# Patient Record
Sex: Male | Born: 1955 | Race: White | Hispanic: No | Smoking: Current every day smoker
Health system: Southern US, Community
[De-identification: ages and names within clinical notes are randomized; demographics above are authoritative.]

## PROBLEM LIST (undated history)

## (undated) DIAGNOSIS — J449 Chronic obstructive pulmonary disease, unspecified: Secondary | ICD-10-CM

## (undated) DIAGNOSIS — Z720 Tobacco use: Secondary | ICD-10-CM

---

## 2005-01-27 ENCOUNTER — Inpatient Hospital Stay: Payer: Self-pay | Admitting: Internal Medicine

## 2005-01-27 ENCOUNTER — Other Ambulatory Visit: Payer: Self-pay

## 2005-03-22 ENCOUNTER — Emergency Department: Payer: Self-pay | Admitting: Emergency Medicine

## 2016-03-25 ENCOUNTER — Encounter: Payer: Self-pay | Admitting: Emergency Medicine

## 2016-03-25 ENCOUNTER — Other Ambulatory Visit: Payer: Self-pay

## 2016-03-25 ENCOUNTER — Emergency Department: Payer: Self-pay

## 2016-03-25 ENCOUNTER — Observation Stay
Admission: EM | Admit: 2016-03-25 | Discharge: 2016-03-26 | Disposition: A | Discharge status: Home / Self Care | Payer: Self-pay | Attending: Internal Medicine | Admitting: Internal Medicine

## 2016-03-25 DIAGNOSIS — R05 Cough: Secondary | ICD-10-CM | POA: Insufficient documentation

## 2016-03-25 DIAGNOSIS — E871 Hypo-osmolality and hyponatremia: Secondary | ICD-10-CM | POA: Insufficient documentation

## 2016-03-25 DIAGNOSIS — R000 Tachycardia, unspecified: Secondary | ICD-10-CM | POA: Insufficient documentation

## 2016-03-25 DIAGNOSIS — R531 Weakness: Secondary | ICD-10-CM | POA: Insufficient documentation

## 2016-03-25 DIAGNOSIS — F17210 Nicotine dependence, cigarettes, uncomplicated: Secondary | ICD-10-CM | POA: Insufficient documentation

## 2016-03-25 DIAGNOSIS — R079 Chest pain, unspecified: Secondary | ICD-10-CM

## 2016-03-25 DIAGNOSIS — R0602 Shortness of breath: Principal | ICD-10-CM | POA: Insufficient documentation

## 2016-03-25 DIAGNOSIS — E876 Hypokalemia: Secondary | ICD-10-CM | POA: Insufficient documentation

## 2016-03-25 DIAGNOSIS — J441 Chronic obstructive pulmonary disease with (acute) exacerbation: Secondary | ICD-10-CM | POA: Insufficient documentation

## 2016-03-25 DIAGNOSIS — R9431 Abnormal electrocardiogram [ECG] [EKG]: Secondary | ICD-10-CM | POA: Insufficient documentation

## 2016-03-25 DIAGNOSIS — D649 Anemia, unspecified: Secondary | ICD-10-CM | POA: Insufficient documentation

## 2016-03-25 DIAGNOSIS — R918 Other nonspecific abnormal finding of lung field: Secondary | ICD-10-CM | POA: Insufficient documentation

## 2016-03-25 HISTORY — DX: Chronic obstructive pulmonary disease, unspecified: J44.9

## 2016-03-25 HISTORY — DX: Tobacco use: Z72.0

## 2016-03-25 LAB — FOLATE: FOLATE: 8.5 ng/mL (ref >5.9)

## 2016-03-25 LAB — CBC
HCT: 31.1 % — ABNORMAL LOW (ref 40.0–52.0)
Hemoglobin: 10.2 g/dL — ABNORMAL LOW (ref 13.0–18.0)
MCH: 34.5 pg — ABNORMAL HIGH (ref 26.0–34.0)
MCHC: 32.6 g/dL (ref 32.0–36.0)
MCV: 105.8 fL — AB (ref 80.0–100.0)
PLATELETS: 247 10*3/uL (ref 150–440)
RBC: 2.94 MIL/uL — AB (ref 4.40–5.90)
RDW: 21.1 % — ABNORMAL HIGH (ref 11.5–14.5)
WBC: 6.8 10*3/uL (ref 3.8–10.6)

## 2016-03-25 LAB — BASIC METABOLIC PANEL
Anion gap: 8 (ref 5–15)
BUN: 6 mg/dL (ref 6–20)
CHLORIDE: 99 mmol/L — AB (ref 101–111)
CO2: 22 mmol/L (ref 22–32)
CREATININE: 0.58 mg/dL — AB (ref 0.61–1.24)
Calcium: 8 mg/dL — ABNORMAL LOW (ref 8.9–10.3)
Glucose, Bld: 108 mg/dL — ABNORMAL HIGH (ref 65–99)
Potassium: 3.2 mmol/L — ABNORMAL LOW (ref 3.5–5.1)
SODIUM: 129 mmol/L — AB (ref 135–145)

## 2016-03-25 LAB — IRON AND TIBC
IRON: 48 ug/dL (ref 45–182)
Saturation Ratios: 24 % (ref 17.9–39.5)
TIBC: 204 ug/dL — ABNORMAL LOW (ref 250–450)
UIBC: 156 ug/dL

## 2016-03-25 LAB — URINALYSIS COMPLETE WITH MICROSCOPIC (ARMC ONLY)
BILIRUBIN URINE: NEGATIVE
GLUCOSE, UA: 50 mg/dL — AB
LEUKOCYTES UA: NEGATIVE
NITRITE: NEGATIVE
PROTEIN: 30 mg/dL — AB
Specific Gravity, Urine: 1.058 — ABNORMAL HIGH (ref 1.005–1.030)
pH: 6 (ref 5.0–8.0)

## 2016-03-25 LAB — RETICULOCYTES
RBC.: 2.81 MIL/uL — ABNORMAL LOW (ref 4.40–5.90)
RETIC COUNT ABSOLUTE: 98.4 10*3/uL (ref 19.0–183.0)
Retic Ct Pct: 3.5 % — ABNORMAL HIGH (ref 0.4–3.1)

## 2016-03-25 LAB — TROPONIN I: Troponin I: <0.03 ng/mL (ref <0.031)

## 2016-03-25 LAB — FERRITIN: FERRITIN: 364 ng/mL — AB (ref 24–336)

## 2016-03-25 MED ORDER — IPRATROPIUM-ALBUTEROL 0.5-2.5 (3) MG/3ML IN SOLN
3.0000 mL | Freq: Once | RESPIRATORY_TRACT | Status: AC
  Administered 2016-03-25: 3 mL via RESPIRATORY_TRACT
  Filled 2016-03-25: qty 3

## 2016-03-25 MED ORDER — IOPAMIDOL (ISOVUE-370) INJECTION 76%
100.0000 mL | Freq: Once | INTRAVENOUS | Status: AC | PRN
  Administered 2016-03-25: 100 mL via INTRAVENOUS

## 2016-03-25 MED ORDER — SODIUM CHLORIDE 0.9 % IV BOLUS (SEPSIS)
1000.0000 mL | Freq: Once | INTRAVENOUS | Status: AC
  Administered 2016-03-25: 1000 mL via INTRAVENOUS

## 2016-03-25 MED ORDER — METHYLPREDNISOLONE SODIUM SUCC 125 MG IJ SOLR
125.0000 mg | Freq: Once | INTRAMUSCULAR | Status: AC
  Administered 2016-03-25: 125 mg via INTRAVENOUS
  Filled 2016-03-25: qty 2

## 2016-03-25 MED ORDER — POTASSIUM CHLORIDE CRYS ER 20 MEQ PO TBCR
40.0000 meq | EXTENDED_RELEASE_TABLET | Freq: Once | ORAL | Status: AC
  Administered 2016-03-25: 40 meq via ORAL
  Filled 2016-03-25: qty 2

## 2016-03-25 MED ORDER — POTASSIUM CHLORIDE CRYS ER 20 MEQ PO TBCR
EXTENDED_RELEASE_TABLET | ORAL | Status: AC
  Filled 2016-03-25: qty 1

## 2016-03-25 NOTE — ED Notes (Addendum)
Pt has increasing shortness of breath over last week.  Denies pain.  Dyspnea on exertion worst per pt and reports normally never feels like this; still works Architect.  Denies fevers. Pt has also had generalized weakness.

## 2016-03-25 NOTE — ED Provider Notes (Signed)
[facility] Emergency Department Provider Note   ____________________________________________  Time seen:  I have reviewed the triage vital signs and the triage nursing note.  HISTORY  Chief Complaint Shortness of Breath   Historian Patient  HPI Joseph Trevino is a 60 y.o. male who lives alone and does not have a primary care physician, who states he's had increasing shortness of breath especially with any walking or exertion this past one week or so.He does not report a known history of COPD or emphysema or asthma. But he has reported wheezing. He states that he is also had some chest pressure. He states that when he walks short distances his legs give out and he feels weak all over.  He's had some sweats. Denies fever.    History reviewed. No pertinent past medical history.  There are no active problems to display for this patient.   History reviewed. No pertinent past surgical history.  No current outpatient prescriptions on file.  Allergies Review of patient's allergies indicates no known allergies.  History reviewed. No pertinent family history.  Social History Social History  Substance Use Topics  . Smoking status: Current Every Day Smoker  . Smokeless tobacco: None  . Alcohol Use: Yes     Comment: 2-3 times per week    Review of Systems  Constitutional: Negative for fever. Eyes: Negative for visual changes. ENT: Negative for sore throat. Cardiovascular: Positive for chest pain. Respiratory: Positive for shortness of breath. Gastrointestinal: Negative for abdominal pain, vomiting and diarrhea. Genitourinary: Negative for dysuria. Musculoskeletal: Negative for back pain. Skin: Negative for rash. Neurological: Negative for headache. 10 point Review of Systems otherwise negative ____________________________________________   PHYSICAL EXAM:  VITAL SIGNS: ED Triage Vitals  Enc Vitals Group     BP 03/25/16 1427 101/55 mmHg    Pulse Rate 03/25/16 1427 111     Resp 03/25/16 1427 22     Temp 03/25/16 1427 97.6 F (36.4 C)     Temp Source 03/25/16 1427 Oral     SpO2 03/25/16 1427 98 %     Weight 03/25/16 1427 175 lb (79.379 kg)     Height 03/25/16 1427 5\' 9"  (1.753 m)     Head Cir --      Peak Flow --      Pain Score --      Pain Loc --      Pain Edu? --      Excl. in [place]? --      Constitutional: Alert and oriented. No respiratory distress, but looks like he does not feel well. Pale. HEENT   Head: Normocephalic and atraumatic.      Eyes: Conjunctivae are normal. PERRL. Normal extraocular movements.      Ears:         Nose: No congestion/rhinnorhea.   Mouth/Throat: Mucous membranes are moist.   Neck: No stridor. Cardiovascular/Chest: Tachycardic and regular.  No murmurs, rubs, or gallops. Respiratory: No retractions, but decreased air movement throughout all fields with moderate and expiratory wheezing. Mild rhonchi throughout.  Gastrointestinal: Soft. No distention, no guarding, no rebound. Nontender.    Genitourinary/rectal:Deferred Musculoskeletal: Nontender with normal range of motion in all extremities. No joint effusions.  No lower extremity tenderness.  No edema. Neurologic:  Normal speech and language. No gross or focal neurologic deficits are appreciated. Skin:  Skin is warm, dry and intact. No rash noted. Psychiatric: Mood and affect are normal. Speech and behavior are normal. Patient exhibits appropriate insight and judgment.  ____________________________________________   EKG I, Lisa Roca, MD, the attending physician have personally viewed and interpreted all ECGs.  104 bpm. Sinus tachycardia. Narrow QRS. Normal axis. ST segment depression inferiorly and laterally. This is a change from prior EKG ____________________________________________  LABS (pertinent positives/negatives)  Basic metabolic panel significant for sodium 129, potassium 3.2 and chloride 99 White blood count  6.8, hemoglobin 10.2 and platelet count 247 Troponin less than 0.03  ____________________________________________  RADIOLOGY All Xrays were viewed by me. Imaging interpreted by Radiologist.  Chest x-ray two-view: Borderline hyperinflated lungs, which could indicate COPD. Otherwise no active disease in the chest.  CT chest with contrast for PE:  No acute cardiopulmonary disease. No evidence of pulmonary emboli. __________________________________________  PROCEDURES  Procedure(s) performed: None  Critical Care performed: None  ____________________________________________   ED COURSE / ASSESSMENT AND PLAN  Pertinent labs & imaging results that were available during my care of the patient were reviewed by me and considered in my medical decision making (see chart for details).   This patient is here for shortness of breath and some chest discomfort. He does have bronchospasm and I'm suspicious of diagnosis of COPD based on clinical symptoms, history of smoking, and a chest x-ray showing possible hyperinflation consistent with COPD.  He is going be treated with DuoNeb and soluMedrol. From a COPD standpoint, he is not hypoxic, but he is pretty dyspneic.  However some of his description of exertional shortness of breath and fatigue with chest pressure makes me concerned about possible cardiac source, and indeed his EKG does show some minimal ST depression inferiorly that is a change from prior EKG.  His initial troponin is negative, and symptoms have been ongoing all day.  At rest, at present, he is not having chest pain, but I do think that he needs cardiac rule out and cardiac evaluation due to symptoms with EKG findings.  He additionally has similar electrolyte abnormalities including hyponatremia, for which she was given 1 L normal saline bolus here in emergency department. For slight hypokalemia, patient was given 40 mEq by mouth.  I did decide to add on a chest CT to rule out PE  given the tachycardia, and no history of prior COPD.   CONSULTATIONS:   Hospitalist for admission, discussed with Dr. Verdell Carmine.   Patient / Family / Caregiver informed of clinical course, medical decision-making process, and agree with plan.     ___________________________________________   FINAL CLINICAL IMPRESSION(S) / ED DIAGNOSES   Final diagnoses:  COPD exacerbation ([place])  Chest pain, unspecified              Note: This dictation was prepared with Dragon dictation. Any transcriptional errors that result from this process are unintentional   Lisa Roca, MD 03/25/16 2014

## 2016-03-25 NOTE — H&P (Signed)
[facility] at [place] NAME: Joseph Trevino    MR#:  [id number]  DATE OF BIRTH:  [birth year]  DATE OF ADMISSION:  03/25/2016  PRIMARY CARE PHYSICIAN: No primary care provider on file.   REQUESTING/REFERRING PHYSICIAN: Dr. Lisa Roca  CHIEF COMPLAINT:   Chief Complaint  Patient presents with  . Shortness of Breath    HISTORY OF PRESENT ILLNESS:  Joseph Trevino  is a 60 y.o. male with a known history of tobacco abuse, COPD who presents to the hospital due to worsening shortness of breath over the past few days. Patient says that he has a long time history of tobacco abuse but has not been short of breath like he has been for the past 2-3 days. He has significant shortness of breath just on minimal exertion. He admits to a cough but it's nonproductive. Denies any fevers chills contacts myalgias, abdominal pain, nausea, vomiting, diaphoresis palpitations or any other associated symptoms. Patient presented to the hospital and was noted to have an EKG on admission which showed ST depressions in the lateral leads. She does not see a doctor regularly and does not take any prescription medications. Compared to his previous EKG in 2006 this is a significant change and therefore hospitalist services were contacted further treatment and evaluation.  PAST MEDICAL HISTORY:   Past Medical History  Diagnosis Date  . Tobacco abuse   . COPD (chronic obstructive pulmonary disease) ([place])     PAST SURGICAL HISTORY:  History reviewed. No pertinent past surgical history.  SOCIAL HISTORY:   Social History  Substance Use Topics  . Smoking status: Current Every Day Smoker -- 1.00 packs/day for 35 years    Types: Cigarettes  . Smokeless tobacco: Not on file  . Alcohol Use: 0.0 oz/week    0 Standard drinks or equivalent per week     Comment: 2-3 times per week    FAMILY HISTORY:   Family History  Problem Relation Age of Onset  . Hypertension Neg Hx    . Diabetes Neg Hx     DRUG ALLERGIES:  No Known Allergies  REVIEW OF SYSTEMS:   Review of Systems  Constitutional: Negative for fever and weight loss.  HENT: Negative for congestion, nosebleeds and tinnitus.   Eyes: Negative for blurred vision, double vision and redness.  Respiratory: Positive for cough and shortness of breath. Negative for hemoptysis.   Cardiovascular: Negative for chest pain, orthopnea, leg swelling and PND.  Gastrointestinal: Negative for nausea, vomiting, abdominal pain, diarrhea and melena.  Genitourinary: Negative for dysuria, urgency and hematuria.  Musculoskeletal: Negative for joint pain and falls.  Neurological: Negative for dizziness, tingling, sensory change, focal weakness, seizures, weakness and headaches.  Endo/Heme/Allergies: Negative for polydipsia. Does not bruise/bleed easily.  Psychiatric/Behavioral: Negative for depression and memory loss. The patient is not nervous/anxious.     MEDICATIONS AT HOME:   Prior to Admission medications   Not on File      VITAL SIGNS:  Blood pressure 110/99, pulse 94, temperature 98.4 F (36.9 C), temperature source Oral, resp. rate 20, height 5\' 9"  (1.753 m), weight 79.379 kg (175 lb), SpO2 100 %.  PHYSICAL EXAMINATION:  Physical Exam  GENERAL:  60 y.o.-year-old patient lying in the bed with no acute distress.  EYES: Pupils equal, round, reactive to light and accommodation. No scleral icterus. Extraocular muscles intact.  HEENT: Head atraumatic, normocephalic. Oropharynx and nasopharynx clear. No oropharyngeal erythema, moist oral mucosa  NECK:  Supple, no  jugular venous distention. No thyroid enlargement, no tenderness.  LUNGS: Prolonged inspiratory and expiratory phase. Minimal end expiratory wheezing bilaterally No rales, rhonchi. No use of accessory muscles CARDIOVASCULAR: S1, S2 RRR. No murmurs, rubs, gallops, clicks.  ABDOMEN: Soft, nontender, nondistended. Bowel sounds present. No organomegaly or  mass.  EXTREMITIES: No pedal edema, cyanosis, or clubbing. + 2 pedal & radial pulses b/l.   NEUROLOGIC: Cranial nerves II through XII are intact. No focal Motor or sensory deficits appreciated b/l PSYCHIATRIC: The patient is alert and oriented x 3. Good affect.  SKIN: No obvious rash, lesion, or ulcer.   LABORATORY PANEL:   CBC  Recent Labs Lab 03/25/16 1430  WBC 6.8  HGB 10.2*  HCT 31.1*  PLT 247   ------------------------------------------------------------------------------------------------------------------  Chemistries   Recent Labs Lab 03/25/16 1430  NA 129*  K 3.2*  CL 99*  CO2 22  GLUCOSE 108*  BUN 6  CREATININE 0.58*  CALCIUM 8.0*   ------------------------------------------------------------------------------------------------------------------  Cardiac Enzymes  Recent Labs Lab 03/25/16 1430  TROPONINI <0.03   ------------------------------------------------------------------------------------------------------------------  RADIOLOGY:  Dg Chest 2 View  03/25/2016  CLINICAL DATA:  Shortness of breath EXAM: CHEST  2 VIEW COMPARISON:  None. FINDINGS: Normal heart size. Normal mediastinal contour. No pneumothorax. No pleural effusion. Borderline hyperinflated lungs. No pulmonary edema. No acute consolidative airspace disease. IMPRESSION: Borderline hyperinflated lungs, which could indicate COPD. Otherwise no active disease in the chest. Electronically Signed   By: Ilona Sorrel M.D.   On: 03/25/2016 15:11   Ct Angio Chest Pe W/cm &/or Wo Cm  03/25/2016  CLINICAL DATA:  Increasing shortness of breath over the past week. Generalized weakness. EXAM: CT ANGIOGRAPHY CHEST WITH CONTRAST TECHNIQUE: Multidetector CT imaging of the chest was performed using the standard protocol during bolus administration of intravenous contrast. Multiplanar CT image reconstructions and MIPs were obtained to evaluate the vascular anatomy. CONTRAST:  100 mL Isovue 370 IV. COMPARISON:   Chest x-ray 03/25/2016 FINDINGS: Lungs are well inflated without focal consolidation or effusion. There is a round 3 mm nodule over the right lower lobe. Airways are within normal. Heart is normal in size. Mild calcified plaque over the left anterior descending coronary artery and right coronary artery. Pulmonary arterial system is well opacified without evidence of emboli. Mild calcified plaque over the aortic arch. No evidence of hilar or mediastinal adenopathy. Remaining mediastinal structures are within normal. Images through the upper abdomen demonstrate diffuse low-attenuation of the liver. There are a few small nonspecific lymph nodes in the gastrohepatic ligament. There mild degenerate changes of the spine. Review of the MIP images confirms the above findings. IMPRESSION: No acute cardiopulmonary disease.  No evidence of pulmonary emboli. 3 mm nodule over the right lower lobe. Recommend follow-up noncontrast chest CT 1 year. This recommendation follows the consensus statement: Guidelines for Management of Small Pulmonary Nodules Detected on CT Scans: A Statement from the [place] as published in Radiology 2005; 237:395-400. Online at: [email or web address] Electronically Signed   By: Marin Olp M.D.   On: 03/25/2016 20:05     IMPRESSION AND PLAN:   60 year old male with past medical history of COPD, tobacco abuse who presents to the hospital with shortness of breath and noted to have an abnormal EKG.  1. Shortness of breath with abnormal EKG-questionable this is a anginal equivalent or shortness of breath related to his underlying COPD. Patient has ST depressions in his lateral leads which are new from his previous EKG in 2006. He does not see a physician  regularly. -He denies any acute chest pain. I will observe him on telemetry, cycle his cardiac markers. Check a two-dimensional echocardiogram. -If his heart markers are positive then he may benefit  from a cardiology consultation. -Continue aspirin for now.  2. COPD-no acute exacerbation. -I will place him on DuoNeb's when necessary. Needs to be assessed for home oxygen prior to discharge. -She may also benefit from some inhalers prior to discharge.  3. Anemia-microcytic in nature. -I will check an anemia panel.  4. Hyponatremia/hypokalemia-I will placement some gentle IV fluids and repeat his electrolytes in the morning.    All the records are reviewed and case discussed with ED provider. Management plans discussed with the patient, family and they are in agreement.  CODE STATUS: Full code  TOTAL TIME TAKING CARE OF THIS PATIENT: 40 minutes.    Henreitta Leber M.D on 03/25/2016 at 8:40 PM  Between 7am to 6pm - Pager - [phone]  After 6pm go to [email or web address] - password [email or web address] [facility]  [facility]  Office  [phone]  CC: Primary care physician; No primary care provider on file.

## 2016-03-26 LAB — VITAMIN B12: Vitamin B-12: 1064 pg/mL — ABNORMAL HIGH (ref 180–914)

## 2016-03-26 LAB — CBC
HEMATOCRIT: 26.3 % — AB (ref 40.0–52.0)
HEMOGLOBIN: 8.7 g/dL — AB (ref 13.0–18.0)
MCH: 35.7 pg — AB (ref 26.0–34.0)
MCHC: 32.9 g/dL (ref 32.0–36.0)
MCV: 108.7 fL — AB (ref 80.0–100.0)
Platelets: 202 10*3/uL (ref 150–440)
RBC: 2.42 MIL/uL — AB (ref 4.40–5.90)
RDW: 21.5 % — ABNORMAL HIGH (ref 11.5–14.5)
WBC: 7.1 10*3/uL (ref 3.8–10.6)

## 2016-03-26 LAB — BASIC METABOLIC PANEL
ANION GAP: 11 (ref 5–15)
BUN: 9 mg/dL (ref 6–20)
CHLORIDE: 102 mmol/L (ref 101–111)
CO2: 18 mmol/L — AB (ref 22–32)
Calcium: 7.7 mg/dL — ABNORMAL LOW (ref 8.9–10.3)
Creatinine, Ser: 0.79 mg/dL (ref 0.61–1.24)
GFR calc non Af Amer: >60 mL/min (ref >60)
Glucose, Bld: 270 mg/dL — ABNORMAL HIGH (ref 65–99)
POTASSIUM: 4.1 mmol/L (ref 3.5–5.1)
SODIUM: 131 mmol/L — AB (ref 135–145)

## 2016-03-26 LAB — TROPONIN I
Troponin I: <0.03 ng/mL (ref <0.031)
Troponin I: <0.03 ng/mL (ref <0.031)

## 2016-03-26 MED ORDER — ASPIRIN EC 81 MG PO TBEC: 81.0000 mg | DELAYED_RELEASE_TABLET | Freq: Every day | ORAL | Status: DC

## 2016-03-26 MED ORDER — ALBUTEROL SULFATE HFA 108 (90 BASE) MCG/ACT IN AERS: 2.0000 | INHALATION_SPRAY | Freq: Four times a day (QID) | RESPIRATORY_TRACT | Status: DC | PRN

## 2016-03-26 MED ORDER — IPRATROPIUM-ALBUTEROL 0.5-2.5 (3) MG/3ML IN SOLN
3.0000 mL | Freq: Four times a day (QID) | RESPIRATORY_TRACT | Status: DC | PRN
  Administered 2016-03-26: 3 mL via RESPIRATORY_TRACT
  Filled 2016-03-26: qty 3

## 2016-03-26 NOTE — Discharge Instructions (Signed)
Heart healthy diet. °Activity as tolerated. °Smoking cessation. °

## 2016-03-26 NOTE — Discharge Summary (Addendum)
[facility] at [place] NAME: Berat Heavey    MR#:  [id number]  DATE OF BIRTH:  07/28/1956  DATE OF ADMISSION:  03/25/2016 ADMITTING PHYSICIAN: No admitting provider for patient encounter.  DATE OF DISCHARGE: 03/26/2016 PRIMARY CARE PHYSICIAN: No primary care provider on file.    ADMISSION DIAGNOSIS:  chest pain   DISCHARGE DIAGNOSIS:  Shortness of breath with abnormal EKG Anemia SECONDARY DIAGNOSIS:   Past Medical History  Diagnosis Date  . Tobacco abuse   . COPD (chronic obstructive pulmonary disease) [facility])     HOSPITAL COURSE:   60 year old male with past medical history of COPD, tobacco abuse who presents to the hospital with shortness of breath and noted to have an abnormal EKG.  1. Shortness of breath with abnormal EKG. The patient has ST depressions in his lateral leads which are new from his previous EKG in 2006.  Normal cardiac markers. Follow-up two-dimensional echocardiogram with PCP. -Continue aspirin.  2. COPD-no acute exacerbation. He is treated with DuoNeb's when necessary. 99-100% O2 saturation in room air.   * Tobacco abuse. Smoking cessation was counseled for 3 min.  3. Anemia-microcytic in nature. Unremarkable anemia panel. Follow-up PCP as outpatient.  4. Hyponatremia/hypokalemia. Improved with normal saline IV and potassium supplement.  The patient doesn't want to go home due to family issue. I discussed his case Freight forwarder and Education officer, museum, gave prescription to Education officer, museum. Patient was encouraged to go to [facility].  DISCHARGE CONDITIONS:   Stable, discharge to home today. Several are discussed with patient, he will be sent to home shelter.  CONSULTS OBTAINED:     DRUG ALLERGIES:  No Known Allergies  DISCHARGE MEDICATIONS:   Current Discharge Medication List    START taking these medications   Details  albuterol (PROVENTIL HFA;VENTOLIN HFA) 108 (90 Base) MCG/ACT inhaler Inhale 2  puffs into the lungs every 6 (six) hours as needed for wheezing or shortness of breath. Qty: 1 Inhaler, Refills: 2    aspirin EC 81 MG tablet Take 1 tablet (81 mg total) by mouth daily. Qty: 30 tablet, Refills: 2         DISCHARGE INSTRUCTIONS:    If you experience worsening of your admission symptoms, develop shortness of breath, life threatening emergency, suicidal or homicidal thoughts you must seek medical attention immediately by calling 911 or calling your MD immediately  if symptoms less severe.  You Must read complete instructions/literature along with all the possible adverse reactions/side effects for all the Medicines you take and that have been prescribed to you. Take any new Medicines after you have completely understood and accept all the possible adverse reactions/side effects.   Please note  You were cared for by a hospitalist during your hospital stay. If you have any questions about your discharge medications or the care you received while you were in the hospital after you are discharged, you can call the unit and asked to speak with the hospitalist on call if the hospitalist that took care of you is not available. Once you are discharged, your primary care physician will handle any further medical issues. Please note that NO REFILLS for any discharge medications will be authorized once you are discharged, as it is imperative that you return to your primary care physician (or establish a relationship with a primary care physician if you do not have one) for your aftercare needs so that they can reassess your need for medications and monitor your lab values.  Today   SUBJECTIVE    No complaint, but he doesn't want to go home due to family issue.  VITAL SIGNS:  Blood pressure 112/82, pulse 82, temperature 98.4 F (36.9 C), temperature source Oral, resp. rate 18, height 5\' 9"  (1.753 m), weight 79.379 kg (175 lb), SpO2 100 %.  I/O:  No intake or output data in the  24 hours ending 03/26/16 1559  PHYSICAL EXAMINATION:  GENERAL:  60 y.o.-year-old patient lying in the bed with no acute distress.  EYES: Pupils equal, round, reactive to light and accommodation. No scleral icterus. Extraocular muscles intact.  HEENT: Head atraumatic, normocephalic. Oropharynx and nasopharynx clear. Moist oral mucosa. NECK:  Supple, no jugular venous distention. No thyroid enlargement, no tenderness.  LUNGS: Normal breath sounds bilaterally, no wheezing, rales,rhonchi or crepitation. No use of accessory muscles of respiration.  CARDIOVASCULAR: S1, S2 normal. No murmurs, rubs, or gallops.  ABDOMEN: Soft, non-tender, non-distended. Bowel sounds present. No organomegaly or mass.  EXTREMITIES: No pedal edema, cyanosis, or clubbing.  NEUROLOGIC: Cranial nerves II through XII are intact. Muscle strength 5/5 in all extremities. Sensation intact. Gait not checked.  PSYCHIATRIC: The patient is alert and oriented x 3.  SKIN: No obvious rash, lesion, or ulcer.   DATA REVIEW:   CBC  Recent Labs Lab 03/26/16 0459  WBC 7.1  HGB 8.7*  HCT 26.3*  PLT 202    Chemistries   Recent Labs Lab 03/26/16 0459  NA 131*  K 4.1  CL 102  CO2 18*  GLUCOSE 270*  BUN 9  CREATININE 0.79  CALCIUM 7.7*    Cardiac Enzymes  Recent Labs Lab 03/26/16 [place] <0.03    Microbiology Results  No results found for this or any previous visit.  RADIOLOGY:  Dg Chest 2 View  03/25/2016  CLINICAL DATA:  Shortness of breath EXAM: CHEST  2 VIEW COMPARISON:  None. FINDINGS: Normal heart size. Normal mediastinal contour. No pneumothorax. No pleural effusion. Borderline hyperinflated lungs. No pulmonary edema. No acute consolidative airspace disease. IMPRESSION: Borderline hyperinflated lungs, which could indicate COPD. Otherwise no active disease in the chest. Electronically Signed   By: Ilona Sorrel M.D.   On: 03/25/2016 15:11   Ct Angio Chest Pe W/cm &/or Wo Cm  03/25/2016  CLINICAL  DATA:  Increasing shortness of breath over the past week. Generalized weakness. EXAM: CT ANGIOGRAPHY CHEST WITH CONTRAST TECHNIQUE: Multidetector CT imaging of the chest was performed using the standard protocol during bolus administration of intravenous contrast. Multiplanar CT image reconstructions and MIPs were obtained to evaluate the vascular anatomy. CONTRAST:  100 mL Isovue 370 IV. COMPARISON:  Chest x-ray 03/25/2016 FINDINGS: Lungs are well inflated without focal consolidation or effusion. There is a round 3 mm nodule over the right lower lobe. Airways are within normal. Heart is normal in size. Mild calcified plaque over the left anterior descending coronary artery and right coronary artery. Pulmonary arterial system is well opacified without evidence of emboli. Mild calcified plaque over the aortic arch. No evidence of hilar or mediastinal adenopathy. Remaining mediastinal structures are within normal. Images through the upper abdomen demonstrate diffuse low-attenuation of the liver. There are a few small nonspecific lymph nodes in the gastrohepatic ligament. There mild degenerate changes of the spine. Review of the MIP images confirms the above findings. IMPRESSION: No acute cardiopulmonary disease.  No evidence of pulmonary emboli. 3 mm nodule over the right lower lobe. Recommend follow-up noncontrast chest CT 1 year. This recommendation follows the consensus statement:  Guidelines for Management of Small Pulmonary Nodules Detected on CT Scans: A Statement from the [place] as published in Radiology 2005; 237:395-400. Online at: [email or web address] Electronically Signed   By: Marin Olp M.D.   On: 03/25/2016 20:05        Management plans discussed with the patient, family and they are in agreement.  CODE STATUS:  Code Status History    This patient does not have a recorded code status. Please follow your organizational policy for patients in this  situation.      TOTAL TIME TAKING CARE OF THIS PATIENT: 36 minutes.    Demetrios Loll M.D on 03/26/2016 at 3:59 PM  Between 7am to 6pm - Pager - [phone]  After 6pm go to [email or web address] - password [email or web address] [facility]  [facility]  Office  [phone]  CC: Primary care physician; No primary care provider on file.

## 2016-03-26 NOTE — Progress Notes (Signed)
LCSW met with patient and he would not allow me to contact his daughter under any circumstance. LCSW provided patient with shelter and food pantry resources. He reports he would really like to stay in the hospital, LCSW discussed patient care and provided him the phone numbers for social services. He is requesting some extra sandwiches and informed me the nurse will get them.  Patient was encouraged to go to [facility]. No further needs [phone]

## 2016-04-19 ENCOUNTER — Emergency Department
Admission: EM | Admit: 2016-04-19 | Discharge: 2016-04-20 | Disposition: A | Discharge status: Home / Self Care | Payer: Self-pay | Attending: Emergency Medicine | Admitting: Emergency Medicine

## 2016-04-19 DIAGNOSIS — R531 Weakness: Secondary | ICD-10-CM | POA: Insufficient documentation

## 2016-04-19 DIAGNOSIS — IMO0002 Reserved for concepts with insufficient information to code with codable children: Secondary | ICD-10-CM

## 2016-04-19 DIAGNOSIS — F10920 Alcohol use, unspecified with intoxication, uncomplicated: Secondary | ICD-10-CM | POA: Insufficient documentation

## 2016-04-19 DIAGNOSIS — T68XXXA Hypothermia, initial encounter: Secondary | ICD-10-CM | POA: Insufficient documentation

## 2016-04-19 DIAGNOSIS — X31XXXA Exposure to excessive natural cold, initial encounter: Secondary | ICD-10-CM | POA: Insufficient documentation

## 2016-04-19 DIAGNOSIS — J449 Chronic obstructive pulmonary disease, unspecified: Secondary | ICD-10-CM | POA: Insufficient documentation

## 2016-04-19 DIAGNOSIS — Z7982 Long term (current) use of aspirin: Secondary | ICD-10-CM | POA: Insufficient documentation

## 2016-04-19 DIAGNOSIS — Z79899 Other long term (current) drug therapy: Secondary | ICD-10-CM | POA: Insufficient documentation

## 2016-04-19 DIAGNOSIS — F17210 Nicotine dependence, cigarettes, uncomplicated: Secondary | ICD-10-CM | POA: Insufficient documentation

## 2016-04-19 DIAGNOSIS — Z5900 Homelessness unspecified: Secondary | ICD-10-CM

## 2016-04-19 LAB — CBC WITH DIFFERENTIAL/PLATELET
Basophils Absolute: 0.1 10*3/uL (ref 0–0.1)
Basophils Relative: 1 %
Eosinophils Absolute: 0 10*3/uL (ref 0–0.7)
Eosinophils Relative: 0 %
HEMATOCRIT: 32.3 % — AB (ref 40.0–52.0)
HEMOGLOBIN: 10.5 g/dL — AB (ref 13.0–18.0)
LYMPHS ABS: 0.2 10*3/uL — AB (ref 1.0–3.6)
Lymphocytes Relative: 3 %
MCH: 32.5 pg (ref 26.0–34.0)
MCHC: 32.4 g/dL (ref 32.0–36.0)
MCV: 100.1 fL — ABNORMAL HIGH (ref 80.0–100.0)
Monocytes Absolute: 0.3 10*3/uL (ref 0.2–1.0)
NEUTROS ABS: 5.9 10*3/uL (ref 1.4–6.5)
Platelets: 91 10*3/uL — ABNORMAL LOW (ref 150–440)
RBC: 3.22 MIL/uL — ABNORMAL LOW (ref 4.40–5.90)
RDW: 16.6 % — ABNORMAL HIGH (ref 11.5–14.5)
WBC: 6.4 10*3/uL (ref 3.8–10.6)

## 2016-04-19 LAB — COMPREHENSIVE METABOLIC PANEL
ALK PHOS: 93 U/L (ref 38–126)
ALT: 53 U/L (ref 17–63)
ANION GAP: 20 — AB (ref 5–15)
AST: 207 U/L — ABNORMAL HIGH (ref 15–41)
Albumin: 2.6 g/dL — ABNORMAL LOW (ref 3.5–5.0)
BILIRUBIN TOTAL: 2.2 mg/dL — AB (ref 0.3–1.2)
BUN: 9 mg/dL (ref 6–20)
CHLORIDE: 94 mmol/L — AB (ref 101–111)
CO2: 17 mmol/L — AB (ref 22–32)
CREATININE: 1.02 mg/dL (ref 0.61–1.24)
Calcium: 8.1 mg/dL — ABNORMAL LOW (ref 8.9–10.3)
GFR calc non Af Amer: >60 mL/min (ref >60)
GLUCOSE: 100 mg/dL — AB (ref 65–99)
Potassium: 3.4 mmol/L — ABNORMAL LOW (ref 3.5–5.1)
Sodium: 131 mmol/L — ABNORMAL LOW (ref 135–145)
Total Protein: 6.8 g/dL (ref 6.5–8.1)

## 2016-04-19 LAB — GLUCOSE, CAPILLARY: Glucose-Capillary: 97 mg/dL (ref 65–99)

## 2016-04-19 LAB — ETHANOL: ALCOHOL ETHYL (B): 115 mg/dL — AB (ref <5)

## 2016-04-19 LAB — TROPONIN I: Troponin I: <0.03 ng/mL (ref <0.031)

## 2016-04-19 MED ORDER — LORAZEPAM 1 MG PO TABS
1.0000 mg | ORAL_TABLET | Freq: Once | ORAL | Status: AC
  Administered 2016-04-20: 1 mg via ORAL
  Filled 2016-04-19: qty 1

## 2016-04-19 MED ORDER — FAMOTIDINE 20 MG PO TABS: 20.0000 mg | ORAL_TABLET | Freq: Two times a day (BID) | ORAL | Status: DC

## 2016-04-19 MED ORDER — SODIUM CHLORIDE 0.9 % IV SOLN
Freq: Once | INTRAVENOUS | Status: AC
  Administered 2016-04-19: 22:00:00 via INTRAVENOUS

## 2016-04-19 MED ORDER — GI COCKTAIL ~~LOC~~
30.0000 mL | Freq: Once | ORAL | Status: AC
  Administered 2016-04-19: 30 mL via ORAL
  Filled 2016-04-19: qty 30

## 2016-04-19 MED ORDER — SODIUM CHLORIDE 0.9 % IV SOLN
Freq: Once | INTRAVENOUS | Status: AC
  Administered 2016-04-19: 23:00:00 via INTRAVENOUS

## 2016-04-19 MED ORDER — FAMOTIDINE 20 MG PO TABS
20.0000 mg | ORAL_TABLET | Freq: Once | ORAL | Status: AC
  Administered 2016-04-19: 20 mg via ORAL
  Filled 2016-04-19: qty 1

## 2016-04-19 NOTE — ED Notes (Signed)
This nurse spoke with Junious Dresser at [facility] [place], [place] ext. [phone], who stated that patient is not on their list and does not have a bed.  Junious Dresser stated patient will need to be reassessed for placement in homeless shelter when the staff arrives in the morning.  MD and charge nurse updated.  Asked patient if he had anywhere else he could go or anyone he could call and he said he didn't.

## 2016-04-19 NOTE — ED Notes (Signed)
PT provided with meal tray, but pt states he wants to get warm first before eating it.

## 2016-04-19 NOTE — Discharge Instructions (Signed)
Hypothermia Hypothermia is a drop in body temperature to 76F (35C) or lower. Hypothermia is life threatening because the body's organs cannot work properly when the body's temperature is low. CAUSES This condition is usually caused by exposure to cold temperatures. It can develop if a person:  Goes into cold weather or stays in a cool room without being dressed warmly enough.  Gets wet, such as after being caught in the rain or falling into cool water.  Has a disease or condition that alters the body's temperature, such as hypothyroidism or Parkinson disease.  Takes a medicine that affects the body's ability to control its temperature. RISK FACTORS This condition is more likely to develop in:  People who are homeless.  People who have difficulty moving around (impaired mobility).  Babies.  Elderly adults.  People who have a medical condition that affects the body's ability to control its temperature.  People who take medicine that affects the body's ability to control its temperature.  People who drink alcohol excessively. SYMPTOMS Symptoms of this condition include:  Drowsiness.  Confusion.  Slowed thinking, speech, and response time.  Slow, shallow breathing.  Slow, weak pulse.  Uncoordinated movements.  Numbness.  Shivering. Shivering happens early in hypothermia and may stop late in hypothermia. DIAGNOSIS  This condition is diagnosed by taking the person's temperature. TREATMENT The first stage of treatment is applying first aid. Depending on the situation, this may involve:  Moving the person to a warmer location.  Protecting the person from the wind.  Laying the person on a blanket or other surface so that he or she is not in contact with the cold ground.  Taking off any wet clothing and wrapping the person in something dry, such as a blanket or coat.  Lying down close to the person and making skin-to-skin contact to warm the person up.  Giving the  person a warm, caffeine-free drink.  Covering the person with a blanket. The person will be handled gently. Vigorous handling can cause the heart to go into an abnormal and possibly fatal heart rhythm. Treatment should not involve applying heat directly to the skin, such as with hot water, a heating lamp, or a heat pack. These things can injure the skin and cause a heart attack. The second stage of treatment is done at medical facility. It may involve:  Giving the person warm fluids through an IV tube.  Giving the person warm, humidified oxygen through nasal tubing (nasal cannula), an oxygen mask, or a breathing tube.  Rewarming the blood by removing it, passing it through a hemodialysis machine, and returning it to the body at gradually increasing temperatures.  Giving a warm solution through a tiny tube that goes into the stomach, bladder, or intestine (cavity lavage).  Giving medicine to stabilize heart rhythm. HOME CARE INSTRUCTIONS   If you have an increased risk of developing hypothermia, keep your home warm.  Stay alert for dangerous weather situations and plan accordingly.  Get out of cold water right away, and replace wet clothing with dry clothing as soon as possible.  Do not ignore your body's signals that you are getting cold.  If you know you will be in a cold environment:  Wear layers of clothing.  Do not drink alcohol. Alcohol causes your blood vessels to get larger (dilate) which makes you lose body heat. SEEK IMMEDIATE MEDICAL CARE IF:  You develop hypothermia again.   This information is not intended to replace advice given to you by your  health care provider. Make sure you discuss any questions you have with your health care provider.   Document Released: 06/01/2010 Document Revised: 09/02/2015 Document Reviewed: 03/09/2015 Elsevier Interactive Patient Education 2016 [employer]. Alcohol Use Disorder Alcohol use disorder is a mental disorder. It is not a  one-time incident of heavy drinking. Alcohol use disorder is the excessive and uncontrollable use of alcohol over time that leads to problems with functioning in one or more areas of daily living. People with this disorder risk harming themselves and others when they drink to excess. Alcohol use disorder also can cause other mental disorders, such as mood and anxiety disorders, and serious physical problems. People with alcohol use disorder often misuse other drugs.  Alcohol use disorder is common and widespread. Some people with this disorder drink alcohol to cope with or escape from negative life events. Others drink to relieve chronic pain or symptoms of mental illness. People with a family history of alcohol use disorder are at higher risk of losing control and using alcohol to excess.  Drinking too much alcohol can cause injury, accidents, and health problems. One drink can be too much when you are:  Working.  Pregnant or breastfeeding.  Taking medicines. Ask your doctor.  Driving or planning to drive. SYMPTOMS  Signs and symptoms of alcohol use disorder may include the following:   Consumption ofalcohol inlarger amounts or over a longer period of time than intended.  Multiple unsuccessful attempts to cutdown or control alcohol use.   A great deal of time spent obtaining alcohol, using alcohol, or recovering from the effects of alcohol (hangover).  A strong desire or urge to use alcohol (cravings).   Continued use of alcohol despite problems at work, school, or home because of alcohol use.   Continued use of alcohol despite problems in relationships because of alcohol use.  Continued use of alcohol in situations when it is physically hazardous, such as driving a car.  Continued use of alcohol despite awareness of a physical or psychological problem that is likely related to alcohol use. Physical problems related to alcohol use can involve the brain, heart, liver, stomach, and  intestines. Psychological problems related to alcohol use include intoxication, depression, anxiety, psychosis, delirium, and dementia.   The need for increased amounts of alcohol to achieve the same desired effect, or a decreased effect from the consumption of the same amount of alcohol (tolerance).  Withdrawal symptoms upon reducing or stopping alcohol use, or alcohol use to reduce or avoid withdrawal symptoms. Withdrawal symptoms include:  Racing heart.  Hand tremor.  Difficulty sleeping.  Nausea.  Vomiting.  Hallucinations.  Restlessness.  Seizures. DIAGNOSIS Alcohol use disorder is diagnosed through an assessment by your health care provider. Your health care provider may start by asking three or four questions to screen for excessive or problematic alcohol use. To confirm a diagnosis of alcohol use disorder, at least two symptoms must be present within a 27-month period. The severity of alcohol use disorder depends on the number of symptoms:  Mild--two or three.  Moderate--four or five.  Severe--six or more. Your health care provider may perform a physical exam or use results from lab tests to see if you have physical problems resulting from alcohol use. Your health care provider may refer you to a mental health professional for evaluation. TREATMENT  Some people with alcohol use disorder are able to reduce their alcohol use to low-risk levels. Some people with alcohol use disorder need to quit drinking alcohol. When  necessary, mental health professionals with specialized training in substance use treatment can help. Your health care provider can help you decide how severe your alcohol use disorder is and what type of treatment you need. The following forms of treatment are available:   Detoxification. Detoxification involves the use of prescription medicines to prevent alcohol withdrawal symptoms in the first week after quitting. This is important for people with a history of  symptoms of withdrawal and for heavy drinkers who are likely to have withdrawal symptoms. Alcohol withdrawal can be dangerous and, in severe cases, cause death. Detoxification is usually provided in a hospital or in-patient substance use treatment facility.  Counseling or talk therapy. Talk therapy is provided by substance use treatment counselors. It addresses the reasons people use alcohol and ways to keep them from drinking again. The goals of talk therapy are to help people with alcohol use disorder find healthy activities and ways to cope with life stress, to identify and avoid triggers for alcohol use, and to handle cravings, which can cause relapse.  Medicines.Different medicines can help treat alcohol use disorder through the following actions:  Decrease alcohol cravings.  Decrease the positive reward response felt from alcohol use.  Produce an uncomfortable physical reaction when alcohol is used (aversion therapy).  Support groups. Support groups are run by people who have quit drinking. They provide emotional support, advice, and guidance. These forms of treatment are often combined. Some people with alcohol use disorder benefit from intensive combination treatment provided by specialized substance use treatment centers. Both inpatient and outpatient treatment programs are available.   This information is not intended to replace advice given to you by your health care provider. Make sure you discuss any questions you have with your health care provider.   Document Released: 01/19/2005 Document Revised: 01/02/2015 Document Reviewed: 03/21/2013 Elsevier Interactive Patient Education [employer].

## 2016-04-19 NOTE — ED Notes (Signed)
Pt arrived via EMS. EMS report they were called out to the homeless shelter because the shelter staff reported the pt passed out for approx 45 seconds. EMS report that [facility] took pt to the homeless shelter after being out in the rain. EMS report pt's CBG was 56mg /dcl. Pt states he has been without shelter for 4 days and has not eaten anything in "a day and a half." EMS admin 12.5G of D50 IV. Pt is A/Ox4 on arrival.

## 2016-04-19 NOTE — ED Provider Notes (Signed)
[facility] Emergency Department Provider Note     Time seen: ----------------------------------------- 8:30 PM on 04/19/2016 -----------------------------------------    I have reviewed the triage vital signs and the nursing notes.   HISTORY  Chief Complaint No chief complaint on file.    HPI Joseph Trevino is a 60 y.o. male who had a syncopal or near-syncopal event at the [facility] today. Patient states he did not passed out just felt weak. He doesn't alcohol use today, he has been living outside for the last week near the economy [place]. Patient denies any recent illness, does not take any medicines.   Past Medical History  Diagnosis Date  . Tobacco abuse   . COPD (chronic obstructive pulmonary disease) [facility])     Patient Active Problem List   Diagnosis Date Noted  . Shortness of breath 03/25/2016    No past surgical history on file.  Allergies Review of patient's allergies indicates no known allergies.  Social History Social History  Substance Use Topics  . Smoking status: Current Every Day Smoker -- 1.00 packs/day for 35 years    Types: Cigarettes  . Smokeless tobacco: Not on file  . Alcohol Use: 0.0 oz/week    0 Standard drinks or equivalent per week     Comment: 2-3 times per week    Review of Systems Constitutional: Negative for fever. Eyes: Negative for visual changes. ENT: Negative for sore throat. Cardiovascular: Negative for chest pain. Respiratory: Negative for shortness of breath. Gastrointestinal: Negative for abdominal pain, vomiting and diarrhea. Genitourinary: Negative for dysuria. Musculoskeletal: Negative for back pain. Skin: Negative for rash. Neurological: Negative for headaches, Positive for weakness  10-point ROS otherwise negative.  ____________________________________________   PHYSICAL EXAM:  VITAL SIGNS: ED Triage Vitals  Enc Vitals Group     BP --      Pulse --      Resp --      Temp --       Temp src --      SpO2 --      Weight --      Height --      Head Cir --      Peak Flow --      Pain Score --      Pain Loc --      Pain Edu? --      Excl. in [place]? --     Constitutional: Alert and oriented. Disheveled, in no distress Eyes: Conjunctivae are normal. PERRL. Normal extraocular movements. ENT   Head: Normocephalic and atraumatic.   Nose: No congestion/rhinnorhea.   Mouth/Throat: Mucous membranes are moist.   Neck: No stridor. Cardiovascular: Normal rate, regular rhythm. No murmurs, rubs, or gallops. Respiratory: Normal respiratory effort without tachypnea nor retractions. Breath sounds are clear and equal bilaterally. No wheezes/rales/rhonchi. Gastrointestinal: Soft and nontender. Normal bowel sounds Musculoskeletal: Nontender with normal range of motion in all extremities. No lower extremity tenderness nor edema. Neurologic:  Normal speech and language. No gross focal neurologic deficits are appreciated.  Skin:  Skin is warm, dry and intact. No rash noted. Psychiatric: Mood and affect are normal. Speech and behavior are normal.  ____________________________________________  EKG: Interpreted by me. Sinus rhythm with significant baseline artifact, rate is 101 bpm, PR interval appears normal, normal axis, normal QRS  ____________________________________________  ED COURSE:  Pertinent labs & imaging results that were available during my care of the patient were reviewed by me and considered in my medical decision making (see chart  for details). Patient [facility] the ER [facility] the [facility] shelter for likely intoxication-related complaints.  Check basic labs and EKG. ____________________________________________    LABS (pertinent positives/negatives)  Labs Reviewed  CBC WITH DIFFERENTIAL/PLATELET - Abnormal; Notable for the following:    RBC 3.22 (*)    Hemoglobin 10.5 (*)    HCT 32.3 (*)    MCV 100.1 (*)    RDW 16.6 (*)    Platelets 91 (*)     Lymphs Abs 0.2 (*)    All other components within normal limits  COMPREHENSIVE METABOLIC PANEL - Abnormal; Notable for the following:    Sodium 131 (*)    Potassium 3.4 (*)    Chloride 94 (*)    CO2 17 (*)    Glucose, Bld 100 (*)    Calcium 8.1 (*)    Albumin 2.6 (*)    AST 207 (*)    Total Bilirubin 2.2 (*)    Anion gap 20 (*)    All other components within normal limits  ETHANOL - Abnormal; Notable for the following:    Alcohol, Ethyl (B) 115 (*)    All other components within normal limits  TROPONIN I  GLUCOSE, CAPILLARY  ____________________________________________  FINAL ASSESSMENT AND PLAN  Weakness, alcohol intoxication, Hypothermia  Plan: Patient with labs as dictated above. Patient is homeless and presents ER hypothermic from being in the wet weather, intoxicated with a disheveled appearance. He was rewarmed while in the ER and was requesting something for heartburn. We gave him Pepcid and a GI cocktail. He'll be discharged with a Pepcid prescription. We have arranged transport for him to the homeless shelter.   Earleen Newport, MD   Earleen Newport, MD 04/19/16 [phone]

## 2016-04-20 ENCOUNTER — Emergency Department: Payer: Self-pay

## 2016-04-20 MED ORDER — IPRATROPIUM-ALBUTEROL 0.5-2.5 (3) MG/3ML IN SOLN
3.0000 mL | Freq: Once | RESPIRATORY_TRACT | Status: AC
  Administered 2016-04-20: 3 mL via RESPIRATORY_TRACT
  Filled 2016-04-20: qty 3

## 2016-04-20 MED ORDER — SODIUM CHLORIDE 0.9 % IV BOLUS (SEPSIS)
1000.0000 mL | Freq: Once | INTRAVENOUS | Status: AC
  Administered 2016-04-20: 1000 mL via INTRAVENOUS

## 2016-04-20 NOTE — ED Notes (Signed)
Discharge per Dr. Owens Shark

## 2016-08-03 ENCOUNTER — Emergency Department
Admission: EM | Admit: 2016-08-03 | Discharge: 2016-08-03 | Disposition: A | Discharge status: Home / Self Care | Payer: Self-pay | Attending: Emergency Medicine | Admitting: Emergency Medicine

## 2016-08-03 ENCOUNTER — Encounter: Payer: Self-pay | Admitting: Emergency Medicine

## 2016-08-03 DIAGNOSIS — J449 Chronic obstructive pulmonary disease, unspecified: Secondary | ICD-10-CM | POA: Insufficient documentation

## 2016-08-03 DIAGNOSIS — F10920 Alcohol use, unspecified with intoxication, uncomplicated: Secondary | ICD-10-CM

## 2016-08-03 DIAGNOSIS — Z79899 Other long term (current) drug therapy: Secondary | ICD-10-CM | POA: Insufficient documentation

## 2016-08-03 DIAGNOSIS — F17210 Nicotine dependence, cigarettes, uncomplicated: Secondary | ICD-10-CM | POA: Insufficient documentation

## 2016-08-03 DIAGNOSIS — E860 Dehydration: Secondary | ICD-10-CM | POA: Insufficient documentation

## 2016-08-03 DIAGNOSIS — F10120 Alcohol abuse with intoxication, uncomplicated: Secondary | ICD-10-CM | POA: Insufficient documentation

## 2016-08-03 DIAGNOSIS — Z7982 Long term (current) use of aspirin: Secondary | ICD-10-CM | POA: Insufficient documentation

## 2016-08-03 LAB — URINALYSIS COMPLETE WITH MICROSCOPIC (ARMC ONLY)
BILIRUBIN URINE: NEGATIVE
Bacteria, UA: NONE SEEN
GLUCOSE, UA: NEGATIVE mg/dL
HGB URINE DIPSTICK: NEGATIVE
KETONES UR: NEGATIVE mg/dL
LEUKOCYTES UA: NEGATIVE
NITRITE: NEGATIVE
PH: 6 (ref 5.0–8.0)
Protein, ur: NEGATIVE mg/dL
RBC / HPF: NONE SEEN RBC/hpf (ref 0–5)
Specific Gravity, Urine: 1.002 — ABNORMAL LOW (ref 1.005–1.030)
Squamous Epithelial / LPF: NONE SEEN
WBC, UA: NONE SEEN WBC/hpf (ref 0–5)

## 2016-08-03 LAB — COMPREHENSIVE METABOLIC PANEL
ALBUMIN: 2.8 g/dL — AB (ref 3.5–5.0)
ALK PHOS: 88 U/L (ref 38–126)
ALT: 22 U/L (ref 17–63)
ANION GAP: 7 (ref 5–15)
AST: 65 U/L — AB (ref 15–41)
CALCIUM: 7.9 mg/dL — AB (ref 8.9–10.3)
CO2: 22 mmol/L (ref 22–32)
Chloride: 104 mmol/L (ref 101–111)
Creatinine, Ser: 0.59 mg/dL — ABNORMAL LOW (ref 0.61–1.24)
GFR calc Af Amer: >60 mL/min (ref >60)
GFR calc non Af Amer: >60 mL/min (ref >60)
GLUCOSE: 93 mg/dL (ref 65–99)
Potassium: 3.2 mmol/L — ABNORMAL LOW (ref 3.5–5.1)
SODIUM: 133 mmol/L — AB (ref 135–145)
Total Bilirubin: 0.8 mg/dL (ref 0.3–1.2)
Total Protein: 6.8 g/dL (ref 6.5–8.1)

## 2016-08-03 LAB — CBC WITH DIFFERENTIAL/PLATELET
BASOS ABS: 0 10*3/uL (ref 0–0.1)
Basophils Relative: 1 %
EOS ABS: 0.1 10*3/uL (ref 0–0.7)
Eosinophils Relative: 3 %
HCT: 33.3 % — ABNORMAL LOW (ref 40.0–52.0)
Hemoglobin: 11.7 g/dL — ABNORMAL LOW (ref 13.0–18.0)
LYMPHS ABS: 0.8 10*3/uL — AB (ref 1.0–3.6)
Lymphocytes Relative: 38 %
MCH: 31.5 pg (ref 26.0–34.0)
MCHC: 35.2 g/dL (ref 32.0–36.0)
MCV: 89.6 fL (ref 80.0–100.0)
MONO ABS: 0.3 10*3/uL (ref 0.2–1.0)
MONOS PCT: 14 %
Neutro Abs: 0.8 10*3/uL — ABNORMAL LOW (ref 1.4–6.5)
Neutrophils Relative %: 44 %
PLATELETS: 86 10*3/uL — AB (ref 150–440)
RBC: 3.71 MIL/uL — AB (ref 4.40–5.90)
RDW: 18 % — AB (ref 11.5–14.5)
WBC: 2 10*3/uL — AB (ref 3.8–10.6)

## 2016-08-03 LAB — ETHANOL: Alcohol, Ethyl (B): 387 mg/dL (ref <5)

## 2016-08-03 LAB — LIPASE, BLOOD: Lipase: 25 U/L (ref 11–51)

## 2016-08-03 MED ORDER — SODIUM CHLORIDE 0.9 % IV BOLUS (SEPSIS)
2000.0000 mL | Freq: Once | INTRAVENOUS | Status: AC
  Administered 2016-08-03: 2000 mL via INTRAVENOUS

## 2016-08-03 NOTE — ED Notes (Signed)
Pt smells strongly of alcohol.

## 2016-08-03 NOTE — ED Provider Notes (Signed)
[facility] Emergency Department Provider Note  ____________________________________________  Time seen: Approximately 6:45 PM  I have reviewed the triage vital signs and the nursing notes.   HISTORY  Chief Complaint Weakness    HPI Joseph Trevino is a 60 y.o. male brought to the ED after being found on the ground outside of a store. Patient denies any complaints. States he feels well. Has been drinking heavily today. As he does every day. No vomiting chest pain shortness breath headache. No falls or trauma.     Past Medical History:  Diagnosis Date  . COPD (chronic obstructive pulmonary disease) ([place])   . Tobacco abuse      Patient Active Problem List   Diagnosis Date Noted  . Shortness of breath 03/25/2016     History reviewed. No pertinent surgical history.   Prior to Admission medications   Medication Sig Start Date End Date Taking? Authorizing Provider  albuterol (PROVENTIL HFA;VENTOLIN HFA) 108 (90 Base) MCG/ACT inhaler Inhale 2 puffs into the lungs every 6 (six) hours as needed for wheezing or shortness of breath. 03/26/16  Yes Demetrios Loll, MD  aspirin EC 81 MG tablet Take 1 tablet (81 mg total) by mouth daily. 03/26/16  Yes Demetrios Loll, MD  famotidine (PEPCID) 20 MG tablet Take 1 tablet (20 mg total) by mouth 2 (two) times daily. 04/19/16  Yes Earleen Newport, MD     Allergies Review of patient's allergies indicates no known allergies.   Family History  Problem Relation Age of Onset  . Hypertension Neg Hx   . Diabetes Neg Hx     Social History Social History  Substance Use Topics  . Smoking status: Current Every Day Smoker    Packs/day: 1.00    Years: 35.00    Types: Cigarettes  . Smokeless tobacco: Not on file  . Alcohol use 0.0 oz/week     Comment: 2-3 times per week    Review of Systems  Constitutional:   No fever or chills.  ENT:   No sore throat. No rhinorrhea. Cardiovascular:   No chest pain. Respiratory:   No  dyspnea or cough. Gastrointestinal:   Negative for abdominal pain, vomiting and diarrhea.  Genitourinary:   Negative for dysuria or difficulty urinating. Musculoskeletal:   Negative for focal pain or swelling Neurological:   Negative for headaches 10-point ROS otherwise negative. No SI ____________________________________________   PHYSICAL EXAM:  VITAL SIGNS: ED Triage Vitals [08/03/16 1809]  Enc Vitals Group     BP 93/65     Pulse Rate (!) 110     Resp 18     Temp 98.4 F (36.9 C)     Temp Source Oral     SpO2 98 %     Weight 156 lb 9 oz (71 kg)     Height 5\' 9"  (1.753 m)     Head Circumference      Peak Flow      Pain Score      Pain Loc      Pain Edu?      Excl. in [place]?     Vital signs reviewed, nursing assessments reviewed.   Constitutional:   Alert and oriented. Well appearing and in no distress. Eyes:   No scleral icterus. No conjunctival pallor. PERRL. EOMI.  No nystagmus. ENT   Head:   Normocephalic and atraumatic.   Nose:   No congestion/rhinnorhea. No septal hematoma   Mouth/Throat:   Dry mucous membranes, no pharyngeal erythema. No  peritonsillar mass.    Neck:   No stridor. No SubQ emphysema. No meningismus. Hematological/Lymphatic/Immunilogical:   No cervical lymphadenopathy. Cardiovascular:   Tachycardia heart rate 110. Symmetric bilateral radial and DP pulses.  No murmurs.  Respiratory:   Normal respiratory effort without tachypnea nor retractions. Breath sounds are clear and equal bilaterally. No wheezes/rales/rhonchi. Gastrointestinal:   Soft and nontender. Non distended. There is no CVA tenderness.  No rebound, rigidity, or guarding. Genitourinary:   deferred Musculoskeletal:   Nontender with normal range of motion in all extremities. No joint effusions.  No lower extremity tenderness.  No edema. No CT or L-spine tenderness Neurologic:   Slightly slurred speech.  CN 2-10 normal. Motor grossly intact. No gross focal neurologic deficits  are appreciated.  Skin:    Skin is warm, dry and intact. No rash noted.  No petechiae, purpura, or bullae.  ____________________________________________    LABS (pertinent positives/negatives) (all labs ordered are listed, but only abnormal results are displayed) Labs Reviewed  COMPREHENSIVE METABOLIC PANEL - Abnormal; Notable for the following:       Result Value   Sodium 133 (*)    Potassium 3.2 (*)    BUN <5 (*)    Creatinine, Ser 0.59 (*)    Calcium 7.9 (*)    Albumin 2.8 (*)    AST 65 (*)    All other components within normal limits  ETHANOL - Abnormal; Notable for the following:    Alcohol, Ethyl (B) 387 (*)    All other components within normal limits  CBC WITH DIFFERENTIAL/PLATELET - Abnormal; Notable for the following:    WBC 2.0 (*)    RBC 3.71 (*)    Hemoglobin 11.7 (*)    HCT 33.3 (*)    RDW 18.0 (*)    Platelets 86 (*)    Neutro Abs 0.8 (*)    Lymphs Abs 0.8 (*)    All other components within normal limits  URINALYSIS COMPLETEWITH MICROSCOPIC ([facility] ONLY) - Abnormal; Notable for the following:    Color, Urine YELLOW (*)    APPearance CLEAR (*)    Specific Gravity, Urine 1.002 (*)    All other components within normal limits  LIPASE, BLOOD   ____________________________________________   EKG  Interpreted by me Sinus tachycardia rate 110, normal axis intervals QRS ST segments and T waves.  ____________________________________________    RADIOLOGY    ____________________________________________   PROCEDURES Procedures  ____________________________________________   INITIAL IMPRESSION / ASSESSMENT AND PLAN / ED COURSE  Pertinent labs & imaging results that were available during my care of the patient were reviewed by me and considered in my medical decision making (see chart for details).  Patient well appearing no acute distress. Appears dehydrated secondary to excessive alcohol use. He is intoxicated at this time but not severely so.  We'll give IV fluids for hydration, observed in ED until he is clinically sober.   ----------------------------------------- 10:10 PM on 08/03/2016 -----------------------------------------  Patient feeling better, tolerating oral intake. Labs unremarkable. Anticipate discharge.    Clinical Course   ________________________  ----------------------------------------- 11:04 PM on 08/03/2016 -----------------------------------------  Clear speech, tolerating oral intake, walks with steady gait. Denies any symptoms. Medically stable, will discharge . We'll attempt to assist the patient with community resources related to his homelessness likely will be unable to resolve this issue for him tonight.  ____________________   FINAL CLINICAL IMPRESSION(S) / ED DIAGNOSES  Final diagnoses:  Dehydration  Alcohol intoxication, uncomplicated ([place])  Alcohol intoxication Moderate dehydration  Portions of this note were generated with dragon dictation software. Dictation errors may occur despite best attempts at proofreading.    Carrie Mew, MD 08/03/16 [phone]

## 2016-08-03 NOTE — ED Notes (Signed)
Discharge instructions reviewed with patient. Patient verbalized understanding. Patient ambulated to lobby without difficulty.   

## 2016-08-03 NOTE — ED Triage Notes (Signed)
Pt arrived via EMS. Pt was found by a bystander laying outside a [employer] unresponsive. Bystander called EMS. EMS reports pt responsive on arrival and smelling strongly of alcohol. EMS reports pt reports he is homeless. EMS 88/49, sinus tachycardia 104-120, 95-96% RA, CBG 89.

## 2017-01-26 DEATH — deceased

## 2018-09-24 ENCOUNTER — Inpatient Hospital Stay: Payer: Medicaid Other

## 2018-09-24 ENCOUNTER — Encounter: Payer: Self-pay | Admitting: Emergency Medicine

## 2018-09-24 ENCOUNTER — Emergency Department: Payer: Medicaid Other

## 2018-09-24 ENCOUNTER — Inpatient Hospital Stay
Admission: EM | Admit: 2018-09-24 | Discharge: 2018-09-30 | DRG: 871 | Disposition: A | Discharge status: Hospice | Payer: Medicaid Other | Attending: Internal Medicine | Admitting: Internal Medicine

## 2018-09-24 DIAGNOSIS — E43 Unspecified severe protein-calorie malnutrition: Secondary | ICD-10-CM | POA: Diagnosis present

## 2018-09-24 DIAGNOSIS — I4891 Unspecified atrial fibrillation: Secondary | ICD-10-CM | POA: Diagnosis present

## 2018-09-24 DIAGNOSIS — F1010 Alcohol abuse, uncomplicated: Secondary | ICD-10-CM

## 2018-09-24 DIAGNOSIS — N320 Bladder-neck obstruction: Secondary | ICD-10-CM | POA: Diagnosis present

## 2018-09-24 DIAGNOSIS — R579 Shock, unspecified: Secondary | ICD-10-CM

## 2018-09-24 DIAGNOSIS — D638 Anemia in other chronic diseases classified elsewhere: Secondary | ICD-10-CM | POA: Diagnosis present

## 2018-09-24 DIAGNOSIS — Z7982 Long term (current) use of aspirin: Secondary | ICD-10-CM

## 2018-09-24 DIAGNOSIS — E872 Acidosis: Secondary | ICD-10-CM | POA: Diagnosis present

## 2018-09-24 DIAGNOSIS — C7951 Secondary malignant neoplasm of bone: Secondary | ICD-10-CM | POA: Diagnosis present

## 2018-09-24 DIAGNOSIS — D500 Iron deficiency anemia secondary to blood loss (chronic): Secondary | ICD-10-CM | POA: Diagnosis present

## 2018-09-24 DIAGNOSIS — Z66 Do not resuscitate: Secondary | ICD-10-CM | POA: Diagnosis not present

## 2018-09-24 DIAGNOSIS — F17210 Nicotine dependence, cigarettes, uncomplicated: Secondary | ICD-10-CM | POA: Diagnosis present

## 2018-09-24 DIAGNOSIS — T8386XA Thrombosis of genitourinary prosthetic devices, implants and grafts, initial encounter: Secondary | ICD-10-CM | POA: Diagnosis not present

## 2018-09-24 DIAGNOSIS — N1330 Unspecified hydronephrosis: Secondary | ICD-10-CM | POA: Diagnosis present

## 2018-09-24 DIAGNOSIS — N170 Acute kidney failure with tubular necrosis: Secondary | ICD-10-CM | POA: Diagnosis present

## 2018-09-24 DIAGNOSIS — G9341 Metabolic encephalopathy: Secondary | ICD-10-CM | POA: Diagnosis present

## 2018-09-24 DIAGNOSIS — R739 Hyperglycemia, unspecified: Secondary | ICD-10-CM | POA: Diagnosis present

## 2018-09-24 DIAGNOSIS — R338 Other retention of urine: Secondary | ICD-10-CM | POA: Diagnosis present

## 2018-09-24 DIAGNOSIS — Y900 Blood alcohol level of less than 20 mg/100 ml: Secondary | ICD-10-CM | POA: Diagnosis present

## 2018-09-24 DIAGNOSIS — E860 Dehydration: Secondary | ICD-10-CM | POA: Diagnosis present

## 2018-09-24 DIAGNOSIS — D6959 Other secondary thrombocytopenia: Secondary | ICD-10-CM | POA: Diagnosis present

## 2018-09-24 DIAGNOSIS — Z638 Other specified problems related to primary support group: Secondary | ICD-10-CM

## 2018-09-24 DIAGNOSIS — K7030 Alcoholic cirrhosis of liver without ascites: Secondary | ICD-10-CM | POA: Diagnosis present

## 2018-09-24 DIAGNOSIS — Z79899 Other long term (current) drug therapy: Secondary | ICD-10-CM

## 2018-09-24 DIAGNOSIS — N390 Urinary tract infection, site not specified: Secondary | ICD-10-CM | POA: Diagnosis present

## 2018-09-24 DIAGNOSIS — E871 Hypo-osmolality and hyponatremia: Secondary | ICD-10-CM | POA: Diagnosis not present

## 2018-09-24 DIAGNOSIS — D539 Nutritional anemia, unspecified: Secondary | ICD-10-CM | POA: Diagnosis present

## 2018-09-24 DIAGNOSIS — N179 Acute kidney failure, unspecified: Secondary | ICD-10-CM

## 2018-09-24 DIAGNOSIS — Z515 Encounter for palliative care: Secondary | ICD-10-CM | POA: Diagnosis not present

## 2018-09-24 DIAGNOSIS — F1027 Alcohol dependence with alcohol-induced persisting dementia: Secondary | ICD-10-CM | POA: Diagnosis present

## 2018-09-24 DIAGNOSIS — J449 Chronic obstructive pulmonary disease, unspecified: Secondary | ICD-10-CM | POA: Diagnosis present

## 2018-09-24 DIAGNOSIS — D61818 Other pancytopenia: Secondary | ICD-10-CM | POA: Diagnosis not present

## 2018-09-24 DIAGNOSIS — E870 Hyperosmolality and hypernatremia: Secondary | ICD-10-CM | POA: Diagnosis present

## 2018-09-24 DIAGNOSIS — C61 Malignant neoplasm of prostate: Secondary | ICD-10-CM | POA: Diagnosis present

## 2018-09-24 DIAGNOSIS — A419 Sepsis, unspecified organism: Secondary | ICD-10-CM

## 2018-09-24 DIAGNOSIS — R310 Gross hematuria: Secondary | ICD-10-CM | POA: Diagnosis present

## 2018-09-24 DIAGNOSIS — F10239 Alcohol dependence with withdrawal, unspecified: Secondary | ICD-10-CM | POA: Diagnosis present

## 2018-09-24 DIAGNOSIS — N401 Enlarged prostate with lower urinary tract symptoms: Secondary | ICD-10-CM | POA: Diagnosis present

## 2018-09-24 DIAGNOSIS — D649 Anemia, unspecified: Secondary | ICD-10-CM | POA: Diagnosis present

## 2018-09-24 DIAGNOSIS — R6521 Severe sepsis with septic shock: Secondary | ICD-10-CM | POA: Diagnosis present

## 2018-09-24 DIAGNOSIS — E876 Hypokalemia: Secondary | ICD-10-CM | POA: Diagnosis not present

## 2018-09-24 DIAGNOSIS — T68XXXA Hypothermia, initial encounter: Secondary | ICD-10-CM | POA: Diagnosis present

## 2018-09-24 LAB — BLOOD GAS, ARTERIAL
Acid-base deficit: 6 mmol/L — ABNORMAL HIGH (ref 0.0–2.0)
BICARBONATE: 16 mmol/L — AB (ref 20.0–28.0)
FIO2: 0.21
O2 SAT: 98.5 %
PATIENT TEMPERATURE: 37
pCO2 arterial: 23 mmHg — ABNORMAL LOW (ref 32.0–48.0)
pH, Arterial: 7.45 (ref 7.350–7.450)
pO2, Arterial: 109 mmHg — ABNORMAL HIGH (ref 83.0–108.0)

## 2018-09-24 LAB — URINE DRUG SCREEN, QUALITATIVE (ARMC ONLY)
Amphetamines, Ur Screen: NOT DETECTED
BARBITURATES, UR SCREEN: NOT DETECTED
Benzodiazepine, Ur Scrn: NOT DETECTED
COCAINE METABOLITE, UR ~~LOC~~: NOT DETECTED
Cannabinoid 50 Ng, Ur ~~LOC~~: NOT DETECTED
MDMA (Ecstasy)Ur Screen: NOT DETECTED
Methadone Scn, Ur: NOT DETECTED
Opiate, Ur Screen: NOT DETECTED
Phencyclidine (PCP) Ur S: NOT DETECTED
TRICYCLIC, UR SCREEN: NOT DETECTED

## 2018-09-24 LAB — BASIC METABOLIC PANEL
Anion gap: UNDETERMINED (ref 5–15)
BUN: 138 mg/dL — AB (ref 8–23)
CO2: 19 mmol/L — ABNORMAL LOW (ref 22–32)
Calcium: 7.6 mg/dL — ABNORMAL LOW (ref 8.9–10.3)
Creatinine, Ser: 5.68 mg/dL — ABNORMAL HIGH (ref 0.61–1.24)
GFR calc Af Amer: 11 mL/min — ABNORMAL LOW (ref >60)
GFR calc non Af Amer: 10 mL/min — ABNORMAL LOW (ref >60)
Glucose, Bld: 143 mg/dL — ABNORMAL HIGH (ref 70–99)
POTASSIUM: 3.5 mmol/L (ref 3.5–5.1)
SODIUM: 161 mmol/L — AB (ref 135–145)

## 2018-09-24 LAB — CBC WITH DIFFERENTIAL/PLATELET
BASOS ABS: 0 10*3/uL (ref 0–0.1)
BASOS PCT: 1 %
Basophils Absolute: 0 10*3/uL (ref 0–0.1)
Basophils Relative: 0 %
EOS PCT: 0 %
Eosinophils Absolute: 0 10*3/uL (ref 0–0.7)
Eosinophils Absolute: 0 10*3/uL (ref 0–0.7)
Eosinophils Relative: 0 %
HCT: 12.7 % — CL (ref 40.0–52.0)
HCT: 21.7 % — ABNORMAL LOW (ref 40.0–52.0)
Hemoglobin: 3.8 g/dL — CL (ref 13.0–18.0)
Hemoglobin: 7.1 g/dL — ABNORMAL LOW (ref 13.0–18.0)
LYMPHS ABS: 2 10*3/uL (ref 1.0–3.6)
Lymphocytes Relative: 20 %
Lymphocytes Relative: 18 %
Lymphs Abs: 1 10*3/uL (ref 1.0–3.6)
MCH: 27.2 pg (ref 26.0–34.0)
MCH: 27.2 pg (ref 26.0–34.0)
MCHC: 30.2 g/dL — ABNORMAL LOW (ref 32.0–36.0)
MCHC: 32.6 g/dL (ref 32.0–36.0)
MCV: 89.9 fL (ref 80.0–100.0)
MCV: 83.5 fL (ref 80.0–100.0)
MONO ABS: 0.2 10*3/uL (ref 0.2–1.0)
MONO ABS: 0.1 10*3/uL — AB (ref 0.2–1.0)
MONOS PCT: 2 %
Monocytes Relative: 2 %
Neutro Abs: 7.6 10*3/uL — ABNORMAL HIGH (ref 1.4–6.5)
Neutro Abs: 4.5 10*3/uL (ref 1.4–6.5)
Neutrophils Relative %: 78 %
Neutrophils Relative %: 79 %
PLATELETS: 68 10*3/uL — AB (ref 150–440)
Platelets: 38 10*3/uL — ABNORMAL LOW (ref 150–440)
RBC: 1.41 MIL/uL — ABNORMAL LOW (ref 4.40–5.90)
RBC: 2.59 MIL/uL — ABNORMAL LOW (ref 4.40–5.90)
RDW: 20.5 % — AB (ref 11.5–14.5)
RDW: 17.2 % — AB (ref 11.5–14.5)
WBC: 9.9 10*3/uL (ref 3.8–10.6)
WBC: 5.6 10*3/uL (ref 3.8–10.6)

## 2018-09-24 LAB — COMPREHENSIVE METABOLIC PANEL
ALK PHOS: 155 U/L — AB (ref 38–126)
ALT: 17 U/L (ref 0–44)
AST: 63 U/L — AB (ref 15–41)
Albumin: 2.4 g/dL — ABNORMAL LOW (ref 3.5–5.0)
Anion gap: UNDETERMINED (ref 5–15)
BUN: 155 mg/dL — AB (ref 8–23)
CALCIUM: 8.4 mg/dL — AB (ref 8.9–10.3)
CO2: 17 mmol/L — AB (ref 22–32)
Chloride: >130 mmol/L (ref 98–111)
Creatinine, Ser: 6.66 mg/dL — ABNORMAL HIGH (ref 0.61–1.24)
GFR calc non Af Amer: 8 mL/min — ABNORMAL LOW (ref >60)
GFR, EST AFRICAN AMERICAN: 9 mL/min — AB (ref >60)
Glucose, Bld: 179 mg/dL — ABNORMAL HIGH (ref 70–99)
Potassium: 4 mmol/L (ref 3.5–5.1)
SODIUM: 161 mmol/L — AB (ref 135–145)
Total Bilirubin: 1.8 mg/dL — ABNORMAL HIGH (ref 0.3–1.2)
Total Protein: 6.4 g/dL — ABNORMAL LOW (ref 6.5–8.1)

## 2018-09-24 LAB — URINALYSIS, COMPLETE (UACMP) WITH MICROSCOPIC
BACTERIA UA: NONE SEEN
BILIRUBIN URINE: NEGATIVE
GLUCOSE, UA: NEGATIVE mg/dL
KETONES UR: NEGATIVE mg/dL
NITRITE: NEGATIVE
PH: 5 (ref 5.0–8.0)
PROTEIN: NEGATIVE mg/dL
Specific Gravity, Urine: 1.012 (ref 1.005–1.030)
Squamous Epithelial / LPF: NONE SEEN (ref 0–5)

## 2018-09-24 LAB — PROTIME-INR
INR: 1.98
PROTHROMBIN TIME: 22.3 s — AB (ref 11.4–15.2)

## 2018-09-24 LAB — SALICYLATE LEVEL

## 2018-09-24 LAB — TROPONIN I: TROPONIN I: 0.05 ng/mL — AB (ref <0.03)

## 2018-09-24 LAB — ABO/RH: ABO/RH(D): A NEG

## 2018-09-24 LAB — MRSA PCR SCREENING: MRSA by PCR: NEGATIVE

## 2018-09-24 LAB — APTT: aPTT: 44 seconds — ABNORMAL HIGH (ref 24–36)

## 2018-09-24 LAB — LACTIC ACID, PLASMA
LACTIC ACID, VENOUS: 4.5 mmol/L — AB (ref 0.5–1.9)
LACTIC ACID, VENOUS: 2.8 mmol/L — AB (ref 0.5–1.9)
Lactic Acid, Venous: 8.4 mmol/L (ref 0.5–1.9)

## 2018-09-24 LAB — ETHANOL: Alcohol, Ethyl (B): <10 mg/dL (ref <10)

## 2018-09-24 LAB — PREPARE RBC (CROSSMATCH)

## 2018-09-24 LAB — ACETAMINOPHEN LEVEL

## 2018-09-24 LAB — GLUCOSE, CAPILLARY: GLUCOSE-CAPILLARY: 144 mg/dL — AB (ref 70–99)

## 2018-09-24 LAB — AMMONIA: Ammonia: 42 umol/L — ABNORMAL HIGH (ref 9–35)

## 2018-09-24 LAB — PROCALCITONIN: Procalcitonin: 1.11 ng/mL

## 2018-09-24 LAB — CK: Total CK: 73 U/L (ref 49–397)

## 2018-09-24 MED ORDER — SODIUM CHLORIDE 0.9 % IV SOLN
8.0000 mg/h | INTRAVENOUS | Status: DC
  Administered 2018-09-24 – 2018-09-25 (×3): 8 mg/h via INTRAVENOUS
  Filled 2018-09-24 (×4): qty 80

## 2018-09-24 MED ORDER — SODIUM CHLORIDE 0.9 % IV SOLN
25.0000 ug/h | INTRAVENOUS | Status: DC
  Administered 2018-09-24 – 2018-09-25 (×2): 25 ug/h via INTRAVENOUS
  Filled 2018-09-24 (×6): qty 1

## 2018-09-24 MED ORDER — FOLIC ACID 5 MG/ML IJ SOLN
1.0000 mg | Freq: Every day | INTRAMUSCULAR | Status: DC
  Administered 2018-09-24 – 2018-09-29 (×6): 1 mg via INTRAVENOUS
  Filled 2018-09-24 (×7): qty 0.2

## 2018-09-24 MED ORDER — ONDANSETRON HCL 4 MG/2ML IJ SOLN: 4.0000 mg | Freq: Four times a day (QID) | INTRAMUSCULAR | Status: DC | PRN

## 2018-09-24 MED ORDER — DEXTROSE 5 % IV SOLN
INTRAVENOUS | Status: AC
  Administered 2018-09-24: 19:00:00 via INTRAVENOUS

## 2018-09-24 MED ORDER — DOCUSATE SODIUM 100 MG PO CAPS
100.0000 mg | ORAL_CAPSULE | Freq: Two times a day (BID) | ORAL | Status: DC
  Administered 2018-09-27 – 2018-09-28 (×4): 100 mg via ORAL
  Filled 2018-09-24 (×4): qty 1

## 2018-09-24 MED ORDER — NOREPINEPHRINE BITARTRATE 1 MG/ML IV SOLN
0.0000 ug/min | INTRAVENOUS | Status: DC
  Administered 2018-09-24: 20 ug/min via INTRAVENOUS
  Filled 2018-09-24: qty 250

## 2018-09-24 MED ORDER — VANCOMYCIN VARIABLE DOSE PER UNSTABLE RENAL FUNCTION (PHARMACIST DOSING): Status: DC

## 2018-09-24 MED ORDER — LACTATED RINGERS IV BOLUS
1000.0000 mL | Freq: Once | INTRAVENOUS | Status: AC
  Administered 2018-09-24: 1000 mL via INTRAVENOUS

## 2018-09-24 MED ORDER — NOREPINEPHRINE 16 MG/250ML-% IV SOLN: 0.0000 ug/min | INTRAVENOUS | Status: DC

## 2018-09-24 MED ORDER — PANTOPRAZOLE SODIUM 40 MG IV SOLR
40.0000 mg | Freq: Two times a day (BID) | INTRAVENOUS | Status: DC
  Administered 2018-09-28 – 2018-09-29 (×3): 40 mg via INTRAVENOUS
  Filled 2018-09-24 (×4): qty 40

## 2018-09-24 MED ORDER — ACETAMINOPHEN 325 MG PO TABS: 650.0000 mg | ORAL_TABLET | Freq: Four times a day (QID) | ORAL | Status: DC | PRN

## 2018-09-24 MED ORDER — SODIUM CHLORIDE 0.9 % IV SOLN
2.0000 g | Freq: Once | INTRAVENOUS | Status: AC
  Administered 2018-09-24: 2 g via INTRAVENOUS
  Filled 2018-09-24: qty 2

## 2018-09-24 MED ORDER — SODIUM CHLORIDE 0.9% IV SOLUTION: Freq: Once | INTRAVENOUS | Status: DC

## 2018-09-24 MED ORDER — VANCOMYCIN HCL IN DEXTROSE 1-5 GM/200ML-% IV SOLN
1000.0000 mg | INTRAVENOUS | Status: DC
  Filled 2018-09-24 (×2): qty 200

## 2018-09-24 MED ORDER — SODIUM CHLORIDE 0.9 % IV SOLN
10.0000 mL/h | Freq: Once | INTRAVENOUS | Status: AC
  Administered 2018-09-24: 10 mL/h via INTRAVENOUS

## 2018-09-24 MED ORDER — ONDANSETRON HCL 4 MG PO TABS: 4.0000 mg | ORAL_TABLET | Freq: Four times a day (QID) | ORAL | Status: DC | PRN

## 2018-09-24 MED ORDER — DEXMEDETOMIDINE HCL IN NACL 400 MCG/100ML IV SOLN: 0.4000 ug/kg/h | INTRAVENOUS | Status: DC

## 2018-09-24 MED ORDER — DEXMEDETOMIDINE HCL IN NACL 400 MCG/100ML IV SOLN
0.4000 ug/kg/h | INTRAVENOUS | Status: DC
  Administered 2018-09-24: 0.8 ug/kg/h via INTRAVENOUS
  Filled 2018-09-24: qty 100

## 2018-09-24 MED ORDER — SODIUM CHLORIDE 0.9 % IV SOLN
2.0000 g | INTRAVENOUS | Status: DC
  Administered 2018-09-24: 2 g via INTRAVENOUS
  Filled 2018-09-24: qty 20
  Filled 2018-09-24: qty 2

## 2018-09-24 MED ORDER — VANCOMYCIN HCL IN DEXTROSE 1-5 GM/200ML-% IV SOLN
1000.0000 mg | Freq: Once | INTRAVENOUS | Status: AC
  Administered 2018-09-24: 1000 mg via INTRAVENOUS
  Filled 2018-09-24: qty 200

## 2018-09-24 MED ORDER — SODIUM CHLORIDE 0.9 % IV SOLN
1.0000 g | INTRAVENOUS | Status: DC
  Filled 2018-09-24: qty 1

## 2018-09-24 MED ORDER — SODIUM CHLORIDE 0.9 % IV SOLN
80.0000 mg | Freq: Once | INTRAVENOUS | Status: AC
  Administered 2018-09-24: 80 mg via INTRAVENOUS
  Filled 2018-09-24: qty 80

## 2018-09-24 MED ORDER — THIAMINE HCL 100 MG/ML IJ SOLN
100.0000 mg | Freq: Every day | INTRAMUSCULAR | Status: DC
  Administered 2018-09-25 – 2018-09-29 (×5): 100 mg via INTRAVENOUS
  Filled 2018-09-24 (×4): qty 2

## 2018-09-24 MED ORDER — METRONIDAZOLE IN NACL 5-0.79 MG/ML-% IV SOLN
500.0000 mg | Freq: Three times a day (TID) | INTRAVENOUS | Status: DC
  Administered 2018-09-24: 500 mg via INTRAVENOUS
  Filled 2018-09-24 (×3): qty 100

## 2018-09-24 MED ORDER — ACETAMINOPHEN 650 MG RE SUPP: 650.0000 mg | Freq: Four times a day (QID) | RECTAL | Status: DC | PRN

## 2018-09-24 MED ORDER — INSULIN ASPART 100 UNIT/ML ~~LOC~~ SOLN
0.0000 [IU] | SUBCUTANEOUS | Status: DC
  Administered 2018-09-24 – 2018-09-25 (×3): 1 [IU] via SUBCUTANEOUS
  Administered 2018-09-25: 2 [IU] via SUBCUTANEOUS
  Administered 2018-09-26 – 2018-09-27 (×5): 1 [IU] via SUBCUTANEOUS
  Filled 2018-09-24 (×9): qty 1

## 2018-09-24 NOTE — ED Triage Notes (Signed)
Pt had wellness call to 911 from [facility]- there was an odor coming from room. Pt lives there, when ems arrived pt was refusing to come to ED. Pt was noted to be hypotensive and tachy, on way here pt went unconscious and lost pulse momentarily, ems able to revive. Sepsis alert called.

## 2018-09-24 NOTE — ED Notes (Signed)
Pt has had a sitter at the bedside since he was made IVC.  Pt is resting.  I have not been able to have a conversation with pt.  Pt is responsive to verbal stimuli

## 2018-09-24 NOTE — H&P (Addendum)
[facility] NAME: Joseph Trevino    MR#:  [id number]  DATE OF BIRTH:  Oct 24, 1956  DATE OF ADMISSION:  09/24/2018  PRIMARY CARE PHYSICIAN: Patient, [name]   REQUESTING/REFERRING PHYSICIAN: Dr. Merlyn Lot  CHIEF COMPLAINT: Altered mental status   Chief Complaint  Patient presents with  . Code Sepsis    HISTORY OF PRESENT ILLNESS:  Joseph Trevino  is a 62 y.o. male with a known history of COPD brought in by EMS because of altered mental status.  Patient has been staying in [facility] for 3 months.found this morning surrounded by empty bottles of liquor.housecleanig staff smelled  something in room and they caled EMS.  Patient was found to be pulseless initially saw the q. pericardial thump and brought him here.  Patient is alert, awake.  Initially patient was refusing to come so he was made IVC and brought into the hospital.  In the ER found to have s severe acute anemia, acute renal failure, hypothermia, severe lactic acidosis, atrial fibrillation.  Severe hypernatremia of 164.  Patient's core body temperature 94.7 so he has a bear hugger on.  Foley was inserted in the emergency room with immediate return of  2 litre's of urine,, he is having emergency blood transfusion, received 1 unit of blood already getting second unit of blood.  Patient previous labs for 2 years ago with normal kidney function, hemoglobin of 11.7.  Not have any blood work in the last 2 years.   PAST MEDICAL HISTORY:   Past Medical History:  Diagnosis Date  . COPD (chronic obstructive pulmonary disease) ([place])   . Tobacco abuse     PAST SURGICAL HISTOIRY:  No past surgical history on file.  SOCIAL HISTORY:   Social History   Tobacco Use  . Smoking status: Current Every Day Smoker    Packs/day: 1.00    Years: 35.00    Pack years: 35.00    Types: Cigarettes  Substance Use Topics  . Alcohol use: Yes    Alcohol/week: 0.0 standard drinks   Comment: 2-3 times per week    FAMILY HISTORY:   Family History  Problem Relation Age of Onset  . Hypertension Neg Hx   . Diabetes Neg Hx     DRUG ALLERGIES:  No Known Allergies  REVIEW OF SYSTEMS:  CONSTITUTIONAL: Patient has generalized weakness EYES: No blurred or double vision.  EARS, NOSE, AND THROAT: No tinnitus or ear pain.  RESPIRATORY: N denies cough, shortness of breath.  CARDIOVASCULAR: No chest pain, orthopnea, edema.  GASTROINTESTINAL: No nausea, vomiting, diarrhea or abdominal pain.  GENITOURINARY: No dysuria, hematuria.  ENDOCRINE: No polyuria, nocturia,  HEMATOLOGY: No anemia, easy bruising or bleeding SKIN; ecchymosis in both hands MUSCULOSKELETAL: No joint pain or arthritis.    NEUROLOGIC: No tingling, numbness, weakness.  PSYCHIATRY: No anxiety or depression.  Does have rash MEDICATIONS AT HOME:   Prior to Admission medications   Medication Sig Start Date End Date Taking? Authorizing Provider  albuterol (PROVENTIL HFA;VENTOLIN HFA) 108 (90 Base) MCG/ACT inhaler Inhale 2 puffs into the lungs every 6 (six) hours as needed for wheezing or shortness of breath. 03/26/16   Demetrios Loll, MD  aspirin EC 81 MG tablet Take 1 tablet (81 mg total) by mouth daily. 03/26/16   Demetrios Loll, MD  famotidine (PEPCID) 20 MG tablet Take 1 tablet (20 mg total) by mouth 2 (two) times daily. 04/19/16   Earleen Newport, MD  VITAL SIGNS:  Blood pressure 96/69, pulse (!) 103, temperature (!) 94.9 F (34.9 C), resp. rate (!) 26, weight 71 kg, SpO2 100 %.  PHYSICAL EXAMINATION:  GENERAL:  62 y.o.-year-old patient lying in the bed with no acute distress.critically ill,  EYES: Pupils equal, round, reactive to light and accommodation. No scleral icterus. Extraocular muscles intact.  HEENT: Head atraumatic, normocephalic. Oropharynx and nasopharynx clear.  NECK:  Supple, no jugular venous distention. No thyroid enlargement, no tenderness.  LUNGS: Diminished breath sounds  bilaterally  cARDIOVASCULAR: S1, S2 normal. No murmurs, rubs, or gallops.  ABDOMEN: Soft, nontender, nondistended. Bowel sounds present. No organomegaly or mass.  EXTREMITIES: No pedal edema, cyanosis, or clubbing.  NEUROLOGIC: No obvious neurological deficit unable to do full neuro exam because patient is covered with a bear hugger on due to hypothermia.  PSYCHIATRIC: The patient is alert and oriented x 3.  SKIN: Cool and pale.  Multiple ecchymotic areas in both hands. LABORATORY PANEL:   CBC Recent Labs  Lab 09/24/18 1454  WBC 9.9  HGB 3.8*  HCT 12.7*  PLT 68*   ------------------------------------------------------------------------------------------------------------------  Chemistries  Recent Labs  Lab 09/24/18 1454  NA 164*  K 4.6  CL 127*  CO2 14*  GLUCOSE 181*  BUN 171*  CREATININE 8.04*  CALCIUM 8.6*  AST 48*  ALT 11  ALKPHOS 179*  BILITOT 1.9*   ------------------------------------------------------------------------------------------------------------------  Cardiac Enzymes Recent Labs  Lab 09/24/18 1454  TROPONINI 0.05*   ------------------------------------------------------------------------------------------------------------------  RADIOLOGY:  Dg Chest Port 1 View  Result Date: 09/24/2018 CLINICAL DATA:  62 year old male with sepsis. EXAM: PORTABLE CHEST 1 VIEW COMPARISON:  Chest radiographs 04/20/2016 and earlier. FINDINGS: Pacer or resuscitation pads projecting over the central chest. Somewhat large lung volumes. Mediastinal contours remain normal. Visualized tracheal air column is within normal limits. Allowing for portable technique the lungs are clear. No pneumothorax. Questionable heterogeneous bone mineralization throughout the visible chest and spine. This might be artifact due to technique. Paucity of bowel gas in the upper abdomen. IMPRESSION: 1.  No acute cardiopulmonary abnormality. 2. Questionable new generalized abnormal sclerosis in the  visible ribs and spine. This may be artifact but sclerotic metastatic disease to bone is difficult to exclude. Electronically Signed   By: Genevie Ann M.D.   On: 09/24/2018 16:31    EKG:   Orders placed or performed during the hospital encounter of 09/24/18  . ED EKG 12-Lead  . ED EKG 12-Lead  . EKG 12-Lead  . EKG 12-Lead  Atrial fibrillation with 116 bpm IMPRESSION AND PLAN:  62 year old male patient with alcohol abuse found surrounded by by alcohol bottles in his room.  Patient found to be pulseless momentarily so EMS have to do pericardial thump and brought in to the hospital and patient was resisting to come, so he was made IVC.  Patient found to have severe multiple electrolyte abnormalities, severe anemia, acute renal failure with severe hypernatremia, lactic acidosis,  Acute severe anemia,hb 3.8,evaluate for internal bleeding,unable to get CT  Abdomen with contrast,GI consult,start IVPPI  2.severe hypernatremia,due to severe dehydration;start iv d5w with thiamine,folic acid,ch check BMP every 6 hours, nephrology is consulted, spoke with Dr. Anthonette Legato. 3.  Acute renal failure likely prerenal and postrenal; patient found to have BUN of 117 and creatinine 8.04; Foley was inserted in the emergency room with immediate return of 2 L of urine, continue to monitor urine output via Foley, continue aggressive hydration patient likely has ATN also.  Continue IV fluids. 4 hypothermia, patient lactic  acidosis evaluate for impending sepsis: Patient is receiving vancomycin, cefepime, Flagyl continue them. 5.  Atrial fibrillation: Unknown duration: Likely secondary to severe anemia, renal failure, hyponatremia check echocardiogram, to use any anticoagulation because of severe anemia   Admitting patient to stepdown unit, spoke with Dr. Mortimer Fries, Dr. Anthonette Legato,  patient is high risk for cardiac arrest/ And is critical  All the records are reviewed and case discussed with ED provider. Management  plans discussed with the patient, family and they are in agreement.  CODE STATUS: Full code  TOTAL TIME TAKING CARE OF THIS PATIENT: 95minutes.    Epifanio Lesches M.D on 09/24/2018 at 4:43 PM  Between 7am to 6pm - Pager - [phone]  After 6pm go to [email or web address] - password [email or web address] [place] [facility]  Office  [phone]  CC: Primary care physician; Patient, [name]  Note: This dictation was prepared with Dragon dictation along with smaller phrase technology. Any transcriptional errors that result from this process are unintentional.

## 2018-09-24 NOTE — ED Notes (Signed)
Bair hugger applied to patient due to hypothermia.

## 2018-09-24 NOTE — ED Notes (Signed)
Pt was given the second unit of emergency release blood.   This unit is O positive and it is unit number [id number] this unit is 34ml

## 2018-09-24 NOTE — Progress Notes (Addendum)
Spoke with GI on-call Dr. Vicente Males recommended to keep hemoglobin around 7 and transfuse to keep hemoglobin up to 8.  Check PT/INR, starting octreotide drip

## 2018-09-24 NOTE — ED Notes (Signed)
Pt was given one unit of O positive blood emergency release blood (total of 343ml) [id number] exp 10/12/18 Infusion began at 1556.  And ended at 1615.  Pt tolerated this well

## 2018-09-24 NOTE — Progress Notes (Signed)
Pharmacy Antibiotic Note  Joseph Trevino is a 62 y.o. male admitted on 09/24/2018 with sepsis.  Pharmacy has been consulted for vancomycin and cefepime dosing.  Plan: Vancomycin 1000 mg once followed by 15 hour stacked dosing of vancomycin 1000 mg IV every 72 hours.  Goal trough 15-20 mcg/mL.  Will monitor SCr and take random trough prior to third dose. Cefepime 1 gm every 24 hours.  Weight: 156 lb 8.4 oz (71 kg)  Temp (24hrs), Avg:95.4 F (35.2 C), Min:94.8 F (34.9 C), Max:95.8 F (35.4 C)  Recent Labs  Lab 09/24/18 1454  WBC 9.9  CREATININE 8.04*  LATICACIDVEN 8.4*    CrCl cannot be calculated (Unknown ideal weight.).    No Known Allergies  Antimicrobials this admission: Vanc 9/30 >>  Cefepime 9/30 >>  Metronidazole 9/30 >>  Dose adjustments this admission:   Microbiology results: 9/30 BCx:  9/30 UCx:    Sputum:    MRSA PCR:   Thank you for allowing pharmacy to be a part of this patient's care.  Forrest Moron 09/24/2018 4:06 PM

## 2018-09-24 NOTE — ED Provider Notes (Signed)
[facility] Emergency Department Provider Note    First MD Initiated Contact with Patient 09/24/18 1448     (approximate)  I have reviewed the triage vital signs and the nursing notes.   HISTORY  Chief Complaint Code Sepsis  Level V Caveat:  AMS  HPI Joseph Trevino is a 62 y.o. male history of COPD  presents via EMS with altered mental status.  Patient was found down in the [facility] where he is reportedly been staying for several months without being surrounded by empty alcohol liquor bottles covered in his own stool.  Patient with spontaneous respirations moving all extremities spontaneously.  Will follow simple commands but will not provide any additional history.   Past Medical History:  Diagnosis Date  . COPD (chronic obstructive pulmonary disease) ([place])   . Tobacco abuse    Family History  Problem Relation Age of Onset  . Hypertension Neg Hx   . Diabetes Neg Hx    No past surgical history on file. Patient Active Problem List   Diagnosis Date Noted  . Shortness of breath 03/25/2016      Prior to Admission medications   Medication Sig Start Date End Date Taking? Authorizing Provider  albuterol (PROVENTIL HFA;VENTOLIN HFA) 108 (90 Base) MCG/ACT inhaler Inhale 2 puffs into the lungs every 6 (six) hours as needed for wheezing or shortness of breath. 03/26/16   Demetrios Loll, MD  aspirin EC 81 MG tablet Take 1 tablet (81 mg total) by mouth daily. 03/26/16   Demetrios Loll, MD  famotidine (PEPCID) 20 MG tablet Take 1 tablet (20 mg total) by mouth 2 (two) times daily. 04/19/16   Earleen Newport, MD    Allergies Patient has no known allergies.    Social History Social History   Tobacco Use  . Smoking status: Current Every Day Smoker    Packs/day: 1.00    Years: 35.00    Pack years: 35.00    Types: Cigarettes  Substance Use Topics  . Alcohol use: Yes    Alcohol/week: 0.0 standard drinks    Comment: 2-3 times per week  . Drug use: No     Review of Systems Patient denies headaches, rhinorrhea, blurry vision, numbness, shortness of breath, chest pain, edema, cough, abdominal pain, nausea, vomiting, diarrhea, dysuria, fevers, rashes or hallucinations unless otherwise stated above in HPI. ____________________________________________   PHYSICAL EXAM:  VITAL SIGNS: Vitals:   09/24/18 1530 09/24/18 1552  BP: 96/73   Pulse:  96  Resp: (!) 30 (!) 31  Temp: (!) 94.8 F (34.9 C) (!) 94.8 F (34.9 C)  SpO2:  100%    Constitutional: critically ill appearing, covered in feces, MAE spontaenously, appears disheveled Eyes: Conjunctivae are pale Head: Atraumatic. Nose: No congestion/rhinnorhea. Mouth/Throat: Mucous membranes are moist.   Neck: No stridor. Painless ROM.  Cardiovascular: tachycardic, regular rhythm. Grossly normal heart sounds.  Good peripheral circulation. Respiratory: mild tachypnea, protecting airway.  No retractions. Lungs CTAB. Gastrointestinal: suprapubic distension. No distention. No abdominal bruits. No CVA tenderness. Genitourinary: normal external genitalia, suprapubic distension Musculoskeletal: No lower extremity tenderness nor edema.  No joint effusions. Neurologic:  Slurred speech, crosses midline to painful stimuli, follows simple commands, No facial droop Skin:  Skin is cool and pale. No rash noted. Psychiatric: unable to assess ____________________________________________   LABS (all labs ordered are listed, but only abnormal results are displayed)  Results for orders placed or performed during the hospital encounter of 09/24/18 (from the past 24 hour(s))  Lactic acid, plasma     Status: Abnormal   Collection Time: 09/24/18  2:54 PM  Result Value Ref Range   Lactic Acid, Venous 8.4 (HH) 0.5 - 1.9 mmol/L  Comprehensive metabolic panel     Status: Abnormal   Collection Time: 09/24/18  2:54 PM  Result Value Ref Range   Sodium 164 (HH) 135 - 145 mmol/L   Potassium 4.6 3.5 - 5.1 mmol/L    Chloride 127 (H) 98 - 111 mmol/L   CO2 14 (L) 22 - 32 mmol/L   Glucose, Bld 181 (H) 70 - 99 mg/dL   BUN 171 (H) 8 - 23 mg/dL   Creatinine, Ser 8.04 (H) 0.61 - 1.24 mg/dL   Calcium 8.6 (L) 8.9 - 10.3 mg/dL   Total Protein 6.7 6.5 - 8.1 g/dL   Albumin 2.5 (L) 3.5 - 5.0 g/dL   AST 48 (H) 15 - 41 U/L   ALT 11 0 - 44 U/L   Alkaline Phosphatase 179 (H) 38 - 126 U/L   Total Bilirubin 1.9 (H) 0.3 - 1.2 mg/dL   GFR calc non Af Amer 6 (L) >60 mL/min   GFR calc Af Amer 7 (L) >60 mL/min   Anion gap 23 (H) 5 - 15  Troponin I     Status: Abnormal   Collection Time: 09/24/18  2:54 PM  Result Value Ref Range   Troponin I 0.05 (HH) <0.03 ng/mL  CBC WITH DIFFERENTIAL     Status: Abnormal   Collection Time: 09/24/18  2:54 PM  Result Value Ref Range   WBC 9.9 3.8 - 10.6 K/uL   RBC 1.41 (L) 4.40 - 5.90 MIL/uL   Hemoglobin 3.8 (LL) 13.0 - 18.0 g/dL   HCT 12.7 (LL) 40.0 - 52.0 %   MCV 89.9 80.0 - 100.0 fL   MCH 27.2 26.0 - 34.0 pg   MCHC 30.2 (L) 32.0 - 36.0 g/dL   RDW 20.5 (H) 11.5 - 14.5 %   Platelets 68 (L) 150 - 440 K/uL   Neutrophils Relative % 78 %   Neutro Abs 7.6 (H) 1.4 - 6.5 K/uL   Lymphocytes Relative 20 %   Lymphs Abs 2.0 1.0 - 3.6 K/uL   Monocytes Relative 2 %   Monocytes Absolute 0.2 0.2 - 1.0 K/uL   Eosinophils Relative 0 %   Eosinophils Absolute 0.0 0 - 0.7 K/uL   Basophils Relative 0 %   Basophils Absolute 0.0 0 - 0.1 K/uL  ABO/Rh     Status: None   Collection Time: 09/24/18  2:54 PM  Result Value Ref Range   ABO/RH(D)      A NEG Performed at [facility], [address] [place], [place] [address]   Prepare RBC     Status: None (Preliminary result)   Collection Time: 09/24/18  4:00 PM  Result Value Ref Range   Order Confirmation PENDING   Type and screen Ordered by PROVIDER DEFAULT     Status: None (Preliminary result)   Collection Time: 09/24/18  4:02 PM  Result Value Ref Range   ABO/RH(D) A NEG    Antibody Screen PENDING    Sample Expiration       09/27/2018 Performed at [facility], [address] [place], [place] [address]    ____________________________________________  EKG My review and personal interpretation at Time: 15:04   Indication: ams  Rate: 115  Rhythm: afib Axis: normal Other: diffuse inferolateral st changes, no stemi ____________________________________________  RADIOLOGY  I personally  reviewed all radiographic images ordered to evaluate for the above acute complaints and reviewed radiology reports and findings.  These findings were personally discussed with the patient.  Please see medical record for radiology report.  ____________________________________________   PROCEDURES  Procedure(s) performed:  .Critical Care Performed by: Merlyn Lot, MD Authorized by: Merlyn Lot, MD   Critical care provider statement:    Critical care time (minutes):  61   Critical care time was exclusive of:  Separately billable procedures and treating other patients   Critical care was necessary to treat or prevent imminent or life-threatening deterioration of the following conditions:  Metabolic crisis, renal failure, sepsis and shock   Critical care was time spent personally by me on the following activities:  Development of treatment plan with patient or surrogate, discussions with consultants, evaluation of patient's response to treatment, examination of patient, obtaining history from patient or surrogate, ordering and performing treatments and interventions, ordering and review of laboratory studies, ordering and review of radiographic studies, pulse oximetry, re-evaluation of patient's condition and review of old charts    Due to difficulty with obtaining IV access, a 18G peripheral IV catheter was inserted using US guidance into the LEJ.  The site was prepped with chlorhexidine and allowed to dry.  The patient tolerated the procedure without any complications.     Critical Care performed:  yes ____________________________________________   INITIAL IMPRESSION / ASSESSMENT AND PLAN / ED COURSE  Pertinent labs & imaging results that were available during my care of the patient were reviewed by me and considered in my medical decision making (see chart for details).   DDX: Dehydration, sepsis, pna, uti, hypoglycemia, cva, drug effect, withdrawal, encephalitis   CARLIN ATTRIDGE is a 62 y.o. who presents to the ED quickly ill as described above.  Patient currently protecting his airway.  Covered in stool.  No hypoxia at this time.  Uncertain amount of downtime but patient does appear disheveled and critically ill.  Blood will be sent for the above differential.  Will start IV fluids for hypotension.  The patient will be placed on continuous pulse oximetry and telemetry for monitoring.  Laboratory evaluation will be sent to evaluate for the above complaints.     Clinical Course as of Sep 24 1634  Mon Sep 24, 2018  1617 Hemodynamics are slowly improving.  Given his hypothermia will initiate broad-spectrum antibiotics.  Seems most clinically consistent with metabolic process.  Possible profound alcohol dependence or substance abuse.  Patient receiving IV blood transfusion.  Given his profound AKI and acute renal failure we will continue with IV hydration.  Prognosis remains very poor.  I do suspect a large part of his renal failure is secondary to obstructive pathology probable enlarged prostate as he is now over 2 L out after Foley catheter insertion.   [PR]    Clinical Course User Index [PR] Merlyn Lot, MD     As part of my medical decision making, I reviewed the following data within the [place] notes reviewed and incorporated, Labs reviewed, notes from prior ED visits.   ____________________________________________   FINAL CLINICAL IMPRESSION(S) / ED DIAGNOSES  Final diagnoses:  Acute metabolic encephalopathy  Bladder outlet obstruction   Sepsis, due to unspecified organism  Shock ([place])      NEW MEDICATIONS STARTED DURING THIS VISIT:  New Prescriptions   No medications on file     Note:  This document was prepared using Dragon voice recognition software and may  include unintentional dictation errors.    Merlyn Lot, MD 09/24/18 [phone]

## 2018-09-24 NOTE — Consult Note (Addendum)
Name: Joseph Trevino MRN: [id number] DOB: 1956-12-11    ADMISSION DATE:  09/24/2018 CONSULTATION DATE: 09/24/2018  REFERRING MD : Dr. Vianne Bulls   CHIEF COMPLAINT: Hypotension   BRIEF PATIENT DESCRIPTION:  62 yo male with significant ETOH hx admitted with septic shock secondary to UTI, severe anemia, thrombocytopenia, acute renal failure, severe hypernatremia, and hypothermia   SIGNIFICANT EVENTS/STUDIES:  09/30-Pt admitted to ICU  09/30-CT Abd Pelvis revealed mild to moderate bilateral hydronephrosis and moderate bilateral hydroureter to the level of a mildly distended urinary bladder, despite presence of a Foley catheter. No obstructing calculi are seen. Diffuse patchy bone sclerosis with appearance compatible with metastatic prostate cancer. Cholelithiasis without evidence of cholecystitis. Findings suggesting cirrhosis of the liver with minimal ascites. 3.1 cm infrarenal abdominal aortic aneurysm. Recommend follow-up by ultrasound in 3 years. This recommendation follows ACR consensus guidelines: White Paper of the ACR Incidental Findings Committee II on Vascular Findings. Joellyn Rued Radiol 2013; [phone] 09/30-CT Head revealed no acute abnormality. Moderate diffuse cerebral and cerebellar atrophy and mild chronic small vessel white matter ischemic changes in both cerebral hemispheres.  HISTORY OF PRESENT ILLNESS:   This is a 62 yo male with a PMH of Tobacco Abuse, COPD, and ETOH Abuse.  He presented to [facility] ER via EMS on 09/30 after being found at the [facility] unresponsive surrounded by empty alcohol bottles and covered in stool.  Per ER notes he has resided at the [facility] for several months and housekeeping notified EMS today after smelling a foul odor from pts room.  Upon EMS arrival the pt was pulseless, they performed a pericardial thump and pt aroused.  He initially refused transport to the ER, therefore he was involuntarily committed and transported to the ER.  In the ER pt  alert and followed commands.  Lab results revealed Na+ 164, K+ 4.6, chloride 127, CO2 14, glucose 181, BUN 171, creatinine 8.04, calcium 8.6, anion gap 23, alk phosphate 179, albumin 2.5, AST 48, ALT 11, total bilirubin 1.9, troponin 0.05, lactic acid 8.4, hgb 3.8, hct 12.7, and platelets 68.  CXR negative for acute cardiopulmonary process and UA positive for leukocytes, rbc's, and wbc's therefore he received iv abx.  CT Abd Pelvis revealed mild to moderate bilateral hydronephrosis, moderate bilateral hydroureter, and diffuse patchy bone sclerosis with appearance compatible with metastatic prostate cancer.  He was subsequently admitted to ICU by hospitalist team for further workup and treatment.    PAST MEDICAL HISTORY :   has a past medical history of COPD (chronic obstructive pulmonary disease) (HCC) and Tobacco abuse.  has no past surgical history on file. Prior to Admission medications   Medication Sig Start Date End Date Taking? Authorizing Provider  albuterol (PROVENTIL HFA;VENTOLIN HFA) 108 (90 Base) MCG/ACT inhaler Inhale 2 puffs into the lungs every 6 (six) hours as needed for wheezing or shortness of breath. 03/26/16   Demetrios Loll, MD  aspirin EC 81 MG tablet Take 1 tablet (81 mg total) by mouth daily. 03/26/16   Demetrios Loll, MD  famotidine (PEPCID) 20 MG tablet Take 1 tablet (20 mg total) by mouth 2 (two) times daily. 04/19/16   Earleen Newport, MD   No Known Allergies  FAMILY HISTORY:  family history is not on file. SOCIAL HISTORY:  reports that he has been smoking cigarettes. He has a 35.00 pack-year smoking history. He does not have any smokeless tobacco history on file. He reports that he drinks alcohol. He reports that he does not use  drugs.   CULTURES  Blood x2 09/30>> Urine 09/30>>   REVIEW OF SYSTEMS: Positives in BOLD Constitutional: Negative for fever, chills, weight loss, malaise/fatigue and diaphoresis.  HENT: Negative for hearing loss, ear pain, nosebleeds, congestion,  sore throat, neck pain, tinnitus and ear discharge.   Eyes: Negative for blurred vision, double vision, photophobia, pain, discharge and redness.  Respiratory: Negative for cough, hemoptysis, sputum production, shortness of breath, wheezing and stridor.   Cardiovascular: Negative for chest pain, palpitations, orthopnea, claudication, leg swelling and PND.  Gastrointestinal: Negative for heartburn, nausea, vomiting, abdominal pain, diarrhea, constipation, blood in stool and melena.  Genitourinary: Negative for dysuria, urgency, frequency, hematuria and flank pain.  Musculoskeletal: Negative for myalgias, back pain, joint pain and falls.  Skin: Negative for itching and rash.  Neurological: Negative for dizziness, tingling, tremors, sensory change, speech change, focal weakness, seizures, loss of consciousness, weakness and headaches.  Endo/Heme/Allergies: Negative for environmental allergies and polydipsia. Does not bruise/bleed easily.  SUBJECTIVE:  Pt agitated repeating "I have to pee"  VITAL SIGNS: Temp:  [94.7 F (34.8 C)-96.9 F (36.1 C)] 96.9 F (36.1 C) (09/30 1800) Pulse Rate:  [93-120] 101 (09/30 1745) Resp:  [23-37] 30 (09/30 1900) BP: (83-114)/(62-81) 114/71 (09/30 1900) SpO2:  [94 %-100 %] 100 % (09/30 1745) Weight:  [52.8 kg-71 kg] 52.8 kg (09/30 1900)  PHYSICAL EXAMINATION: General: acutely ill frail appearing male, NAD  Neuro: agitated, alert and oriented, follows commands, PERRL HEENT: supple, no JVD  Cardiovascular: sinus tach, no R/G Lungs: diminished throughout, even, non labored  Abdomen: hypoactive BS x4, soft, non tender, non distended Musculoskeletal: normal bulk and tone, no edema  Skin: scattered petechiae, rash bilateral groin, left hip abrasion, and abrasion on chest   Recent Labs  Lab 09/24/18 1454  NA 164*  K 4.6  CL 127*  CO2 14*  BUN 171*  CREATININE 8.04*  GLUCOSE 181*   Recent Labs  Lab 09/24/18 1454  HGB 3.8*  HCT 12.7*  WBC 9.9  PLT  68*   Ct Abdomen Pelvis Wo Contrast  Result Date: 09/24/2018 CLINICAL DATA:  Altered mental status. Found on the floor. EXAM: CT ABDOMEN AND PELVIS WITHOUT CONTRAST TECHNIQUE: Multidetector CT imaging of the abdomen and pelvis was performed following the standard protocol without IV contrast. COMPARISON:  None. FINDINGS: Lower chest: Clear lung bases. Hepatobiliary: Multiple small gallstones in the dependent portion of the gallbladder, measuring up to 5 mm in maximum diameter each. No gallbladder wall thickening or pericholecystic fluid. Somewhat small liver with the exception of mildly prominent lateral segment left lobe and caudate lobe. The liver has mildly lobulated contours. Pancreas: Unremarkable. No pancreatic ductal dilatation or surrounding inflammatory changes. Spleen: Mildly prominent without splenomegaly. Adrenals/Urinary Tract: Mild to moderate dilatation of both renal collecting systems and moderate dilatation of both ureters to the level of the urinary bladder. Foley catheter in the bladder with associated air in the bladder. The bladder is distended, despite presence of the Foley catheter. No calculi seen. Normal appearing adrenal glands. Stomach/Bowel: Unremarkable stomach, small bowel and colon. No evidence of appendicitis. Vascular/Lymphatic: Atheromatous calcifications. Small focal infrarenal abdominal aortic aneurysm measuring 3.1 cm in maximum diameter on image number 42 series 2. No enlarged lymph nodes. Reproductive: Prostate is unremarkable. Other: Minimal free peritoneal fluid adjacent to the liver. Small to moderate-sized umbilical hernia containing fat. Musculoskeletal: Diffuse patchy sclerosis of the bones. IMPRESSION: 1. Mild to moderate bilateral hydronephrosis and moderate bilateral hydroureter to the level of a mildly distended urinary bladder,  despite presence of a Foley catheter. No obstructing calculi are seen. 2. Diffuse patchy bone sclerosis with appearance compatible with  metastatic prostate cancer. 3. Cholelithiasis without evidence of cholecystitis. 4. Findings suggesting cirrhosis of the liver with minimal ascites. 5. 3.1 cm infrarenal abdominal aortic aneurysm. Recommend followup by ultrasound in 3 years. This recommendation follows ACR consensus guidelines: White Paper of the ACR Incidental Findings Committee II on Vascular Findings. Joellyn Rued Radiol 2013; [phone] Electronically Signed   By: Claudie Revering M.D.   On: 09/24/2018 19:10   Ct Head Wo Contrast  Result Date: 09/24/2018 CLINICAL DATA:  Altered mental status EXAM: CT HEAD WITHOUT CONTRAST TECHNIQUE: Contiguous axial images were obtained from the base of the skull through the vertex without intravenous contrast. COMPARISON:  None. FINDINGS: Brain: Diffusely enlarged ventricles and subarachnoid spaces. Patchy white matter low density in both cerebral hemispheres. No intracranial hemorrhage, mass lesion or CT evidence of acute infarction. Vascular: No hyperdense vessel or unexpected calcification. Skull: Normal. Negative for fracture or focal lesion. Sinuses/Orbits: Status post bilateral cataract extraction. Unremarkable paranasal sinuses. Other: None. IMPRESSION: 1. No acute abnormality. 2. Moderate diffuse cerebral and cerebellar atrophy and mild chronic small vessel white matter ischemic changes in both cerebral hemispheres. Electronically Signed   By: Claudie Revering M.D.   On: 09/24/2018 19:01   Dg Chest Port 1 View  Result Date: 09/24/2018 CLINICAL DATA:  62 year old male with sepsis. EXAM: PORTABLE CHEST 1 VIEW COMPARISON:  Chest radiographs 04/20/2016 and earlier. FINDINGS: Pacer or resuscitation pads projecting over the central chest. Somewhat large lung volumes. Mediastinal contours remain normal. Visualized tracheal air column is within normal limits. Allowing for portable technique the lungs are clear. No pneumothorax. Questionable heterogeneous bone mineralization throughout the visible chest and spine.  This might be artifact due to technique. Paucity of bowel gas in the upper abdomen. IMPRESSION: 1.  No acute cardiopulmonary abnormality. 2. Questionable new generalized abnormal sclerosis in the visible ribs and spine. This may be artifact but sclerotic metastatic disease to bone is difficult to exclude. Electronically Signed   By: Genevie Ann M.D.   On: 09/24/2018 16:31    ASSESSMENT / PLAN:  Septic shock secondary to possible UTI Elevated troponin likely demand ischemia secondary to sepsis  Continuous telemetry monitoring  Maintain map >65 with aggressive fluid resuscitation  Trend troponin's  Acute renal failure  Lactic acidosis  Severe hypernatremia and hyperchloridemia  Trend BMP and CK  Replace electrolytes as indicated  Monitor UOP  Continue D5W _0  ml/hr Trend lactic acid  Avoid nephrotoxic medications  Nephrology consulted appreciate input   Severe anemia  Hematuria possible secondary to trauma from foley catheter Thrombocytopenia secondary to ETOH abuse   Trend CBC  Will check coags  Gastroenterology consulted appreciate input-per recommendations transfuse for hgb <7 and if INR greater than 1.5 will transfuse FFP Continue protonix and octreotide gtts VTE px: SCD's avoid chemical prophylaxis  Keep NPO for now   Questionable UTI  Trend WBC and monitor fever curve  Trend PCT  Follow cultures  Continue vancomycin for now, will discontinue flagyl and start ceftriaxone   Incidental finding of metastatic prostate cancer and cirrhosis on CT Abd Pelvis 09/24/18 Oncology consulted appreciate input   Hyperglycemia  SSI   ETOH and Tobacco Abuse  CIWA protocol-prn precedex gtt  MVI, thiamine, and folic acid  Psychiatry consulted appreciate input Smoking and ETOH abuse cessation counseling provided Will check salicylate level, ethyl alcohol level, and tylenol level  Marda Stalker, AGNP  Pulmonary/Critical Care Pager [phone] (please enter 7 digits) PCCM Consult  Pager [phone] (please enter 7 digits)

## 2018-09-24 NOTE — Procedures (Signed)
Central Venous Catheter Insertion Procedure Note Joseph Trevino [id number] 09-23-1956  Procedure: Insertion of Central Venous Catheter Indications: Assessment of intravascular volume, Drug and/or fluid administration and Frequent blood sampling  Procedure Details Consent: Unable to obtain consent because of altered level of consciousness. Time Out: Verified patient identification, verified procedure, site/side was marked, verified correct patient position, special equipment/implants available, medications/allergies/relevent history reviewed, required imaging and test results available.  Performed  Maximum sterile technique was used including antiseptics, cap, gloves, gown, hand hygiene, mask and sheet. Skin prep: Chlorhexidine; local anesthetic administered A antimicrobial bonded/coated triple lumen catheter was placed in the right femoral vein due to emergent situation using the Seldinger technique.  Evaluation Blood flow good Complications: No apparent complications Patient did tolerate procedure well. Chest X-ray ordered to verify placement.  CXR: not indicated.  Marda Stalker, [place] Pager [phone] (please enter 7 digits) PCCM Consult Pager [phone] (please enter 7 digits)

## 2018-09-24 NOTE — ED Notes (Signed)
Date and time results received: 09/24/18 3:29 PM  Hgb 3.8 HCT 12.7 Trop 0.05 Sodium 164 Lactic Acid 8.4 Creatnine 8.04 BUN not done- lab has to do a diluation  Name of Provider Notified: Quentin Cornwall

## 2018-09-24 NOTE — ED Notes (Signed)
Sitting 1:1 with pt. 

## 2018-09-24 NOTE — ED Notes (Signed)
Call to CCU

## 2018-09-24 NOTE — ED Notes (Signed)
Second BC not to be drawn per Dr. Quentin Cornwall as blood draw was challenging and delay in ordered care would result from further attempts at blood draw.

## 2018-09-24 NOTE — ED Notes (Signed)
Report given to ICU.  Await clarification if pt is to have any further imaging while in CT in addition to head CT.

## 2018-09-25 ENCOUNTER — Inpatient Hospital Stay: Payer: Medicaid Other

## 2018-09-25 DIAGNOSIS — N320 Bladder-neck obstruction: Secondary | ICD-10-CM

## 2018-09-25 DIAGNOSIS — D649 Anemia, unspecified: Secondary | ICD-10-CM

## 2018-09-25 DIAGNOSIS — D696 Thrombocytopenia, unspecified: Secondary | ICD-10-CM

## 2018-09-25 DIAGNOSIS — J449 Chronic obstructive pulmonary disease, unspecified: Secondary | ICD-10-CM

## 2018-09-25 DIAGNOSIS — K7031 Alcoholic cirrhosis of liver with ascites: Secondary | ICD-10-CM

## 2018-09-25 DIAGNOSIS — R579 Shock, unspecified: Secondary | ICD-10-CM

## 2018-09-25 DIAGNOSIS — F1010 Alcohol abuse, uncomplicated: Secondary | ICD-10-CM

## 2018-09-25 DIAGNOSIS — R4182 Altered mental status, unspecified: Secondary | ICD-10-CM

## 2018-09-25 DIAGNOSIS — G9341 Metabolic encephalopathy: Secondary | ICD-10-CM

## 2018-09-25 DIAGNOSIS — Z7289 Other problems related to lifestyle: Secondary | ICD-10-CM

## 2018-09-25 DIAGNOSIS — M899 Disorder of bone, unspecified: Secondary | ICD-10-CM

## 2018-09-25 LAB — CBC
HCT: 19.9 % — ABNORMAL LOW (ref 40.0–52.0)
HEMOGLOBIN: 6.8 g/dL — AB (ref 13.0–18.0)
MCH: 28.1 pg (ref 26.0–34.0)
MCHC: 34.4 g/dL (ref 32.0–36.0)
MCV: 81.7 fL (ref 80.0–100.0)
Platelets: 81 10*3/uL — ABNORMAL LOW (ref 150–440)
RBC: 2.43 MIL/uL — ABNORMAL LOW (ref 4.40–5.90)
RDW: 17.6 % — ABNORMAL HIGH (ref 11.5–14.5)
WBC: 8.2 10*3/uL (ref 3.8–10.6)

## 2018-09-25 LAB — BPAM PLATELET PHERESIS
BLOOD PRODUCT EXPIRATION DATE: 201910012359
ISSUE DATE / TIME: 201909302321
UNIT TYPE AND RH: 6200

## 2018-09-25 LAB — GLUCOSE, CAPILLARY
GLUCOSE-CAPILLARY: 138 mg/dL — AB (ref 70–99)
GLUCOSE-CAPILLARY: 122 mg/dL — AB (ref 70–99)
GLUCOSE-CAPILLARY: 166 mg/dL — AB (ref 70–99)
GLUCOSE-CAPILLARY: 145 mg/dL — AB (ref 70–99)
Glucose-Capillary: 161 mg/dL — ABNORMAL HIGH (ref 70–99)
Glucose-Capillary: 89 mg/dL (ref 70–99)

## 2018-09-25 LAB — PREPARE PLATELET PHERESIS: Unit division: 0

## 2018-09-25 LAB — COMPREHENSIVE METABOLIC PANEL
ALK PHOS: 179 U/L — AB (ref 38–126)
ALT: 11 U/L (ref 0–44)
AST: 48 U/L — AB (ref 15–41)
Albumin: 2.5 g/dL — ABNORMAL LOW (ref 3.5–5.0)
Anion gap: 23 — ABNORMAL HIGH (ref 5–15)
BUN: 171 mg/dL — AB (ref 8–23)
CO2: 14 mmol/L — ABNORMAL LOW (ref 22–32)
CREATININE: 8.04 mg/dL — AB (ref 0.61–1.24)
Calcium: 8.6 mg/dL — ABNORMAL LOW (ref 8.9–10.3)
Chloride: 127 mmol/L — ABNORMAL HIGH (ref 98–111)
GFR calc Af Amer: 7 mL/min — ABNORMAL LOW (ref >60)
GFR, EST NON AFRICAN AMERICAN: 6 mL/min — AB (ref >60)
Glucose, Bld: 181 mg/dL — ABNORMAL HIGH (ref 70–99)
Potassium: 4.6 mmol/L (ref 3.5–5.1)
Sodium: 164 mmol/L (ref 135–145)
Total Bilirubin: 1.9 mg/dL — ABNORMAL HIGH (ref 0.3–1.2)
Total Protein: 6.7 g/dL (ref 6.5–8.1)

## 2018-09-25 LAB — BASIC METABOLIC PANEL
ANION GAP: 11 (ref 5–15)
Anion gap: 10 (ref 5–15)
Anion gap: 7 (ref 5–15)
BUN: 137 mg/dL — ABNORMAL HIGH (ref 8–23)
BUN: 134 mg/dL — ABNORMAL HIGH (ref 8–23)
BUN: 115 mg/dL — AB (ref 8–23)
CALCIUM: 7.9 mg/dL — AB (ref 8.9–10.3)
CHLORIDE: 126 mmol/L — AB (ref 98–111)
CO2: 18 mmol/L — AB (ref 22–32)
CO2: 18 mmol/L — ABNORMAL LOW (ref 22–32)
CO2: 19 mmol/L — ABNORMAL LOW (ref 22–32)
CREATININE: 5.6 mg/dL — AB (ref 0.61–1.24)
Calcium: 7.9 mg/dL — ABNORMAL LOW (ref 8.9–10.3)
Calcium: 7.4 mg/dL — ABNORMAL LOW (ref 8.9–10.3)
Chloride: 129 mmol/L — ABNORMAL HIGH (ref 98–111)
Chloride: 125 mmol/L — ABNORMAL HIGH (ref 98–111)
Creatinine, Ser: 5.32 mg/dL — ABNORMAL HIGH (ref 0.61–1.24)
Creatinine, Ser: 4.24 mg/dL — ABNORMAL HIGH (ref 0.61–1.24)
GFR calc Af Amer: 11 mL/min — ABNORMAL LOW (ref >60)
GFR calc Af Amer: 12 mL/min — ABNORMAL LOW (ref >60)
GFR calc Af Amer: 16 mL/min — ABNORMAL LOW (ref >60)
GFR calc non Af Amer: 10 mL/min — ABNORMAL LOW (ref >60)
GFR calc non Af Amer: 11 mL/min — ABNORMAL LOW (ref >60)
GFR, EST NON AFRICAN AMERICAN: 14 mL/min — AB (ref >60)
GLUCOSE: 177 mg/dL — AB (ref 70–99)
Glucose, Bld: 197 mg/dL — ABNORMAL HIGH (ref 70–99)
Glucose, Bld: 133 mg/dL — ABNORMAL HIGH (ref 70–99)
POTASSIUM: 3.8 mmol/L (ref 3.5–5.1)
POTASSIUM: 3.4 mmol/L — AB (ref 3.5–5.1)
Potassium: 3.7 mmol/L (ref 3.5–5.1)
SODIUM: 151 mmol/L — AB (ref 135–145)
Sodium: 157 mmol/L — ABNORMAL HIGH (ref 135–145)
Sodium: 155 mmol/L — ABNORMAL HIGH (ref 135–145)

## 2018-09-25 LAB — LACTIC ACID, PLASMA: LACTIC ACID, VENOUS: 1.8 mmol/L (ref 0.5–1.9)

## 2018-09-25 LAB — HEMOGLOBIN AND HEMATOCRIT, BLOOD
HCT: 15.1 % — ABNORMAL LOW (ref 40.0–52.0)
HCT: 22.2 % — ABNORMAL LOW (ref 40.0–52.0)
HEMATOCRIT: 22.7 % — AB (ref 40.0–52.0)
HEMOGLOBIN: 5.1 g/dL — AB (ref 13.0–18.0)
Hemoglobin: 7.9 g/dL — ABNORMAL LOW (ref 13.0–18.0)
Hemoglobin: 7.8 g/dL — ABNORMAL LOW (ref 13.0–18.0)

## 2018-09-25 LAB — PROCALCITONIN: Procalcitonin: 1.17 ng/mL

## 2018-09-25 LAB — PREPARE RBC (CROSSMATCH)

## 2018-09-25 LAB — CK: CK TOTAL: 43 U/L — AB (ref 49–397)

## 2018-09-25 MED ORDER — LORAZEPAM 2 MG/ML IJ SOLN: 2.0000 mg | INTRAMUSCULAR | Status: DC | PRN

## 2018-09-25 MED ORDER — LIDOCAINE HCL URETHRAL/MUCOSAL 2 % EX GEL
1.0000 "application " | CUTANEOUS | Status: AC
  Filled 2018-09-25: qty 5

## 2018-09-25 MED ORDER — LIDOCAINE HCL URETHRAL/MUCOSAL 2 % EX GEL
1.0000 "application " | Freq: Once | CUTANEOUS | Status: DC
  Filled 2018-09-25: qty 5

## 2018-09-25 MED ORDER — LACTATED RINGERS IV BOLUS
2000.0000 mL | Freq: Once | INTRAVENOUS | Status: AC
  Administered 2018-09-25: 2000 mL via INTRAVENOUS

## 2018-09-25 MED ORDER — SODIUM CHLORIDE 0.9 % IV SOLN
2.0000 g | Freq: Every day | INTRAVENOUS | Status: AC
  Administered 2018-09-25 – 2018-09-28 (×4): 2 g via INTRAVENOUS
  Filled 2018-09-25 (×4): qty 2

## 2018-09-25 MED ORDER — THIAMINE HCL 100 MG/ML IJ SOLN
Freq: Once | INTRAVENOUS | Status: AC
  Administered 2018-09-25: 13:00:00 via INTRAVENOUS
  Filled 2018-09-25: qty 1000

## 2018-09-25 MED ORDER — SODIUM CHLORIDE 0.9% IV SOLUTION: Freq: Once | INTRAVENOUS | Status: DC

## 2018-09-25 MED ORDER — SODIUM CHLORIDE 0.9% FLUSH
10.0000 mL | INTRAVENOUS | Status: DC | PRN
  Administered 2018-09-30: 16:00:00 10 mL
  Filled 2018-09-25: qty 40

## 2018-09-25 MED ORDER — SODIUM CHLORIDE 0.9% IV SOLUTION
Freq: Once | INTRAVENOUS | Status: AC
  Administered 2018-09-26: 05:00:00 via INTRAVENOUS

## 2018-09-25 MED ORDER — SODIUM CHLORIDE 0.9% FLUSH
10.0000 mL | Freq: Two times a day (BID) | INTRAVENOUS | Status: DC
  Administered 2018-09-25 – 2018-09-26 (×3): 10 mL
  Administered 2018-09-27 (×2): 30 mL
  Administered 2018-09-27 – 2018-09-29 (×4): 10 mL
  Administered 2018-09-29: 30 mL
  Administered 2018-09-30: 10:00:00 10 mL

## 2018-09-25 NOTE — Consult Note (Signed)
[facility] Consult   Reason for Consult: Consult for this 62 year old man brought in under IVC just to bring him into the hospital because he was refusing treatment Referring Physician: Salary Patient Identification: Joseph Trevino MRN:  [id number] Principal Diagnosis: Alcohol abuse Diagnosis:   Patient Active Problem List   Diagnosis Date Noted  . COPD (chronic obstructive pulmonary disease) ([place]) [J44.9] 09/25/2018  . Alcohol abuse [F10.10] 09/25/2018  . Acute anemia [D64.9] 09/24/2018  . Shortness of breath [R06.02] 03/25/2016    Total Time spent with patient: 1 hour  Subjective:   Joseph Trevino is a 62 y.o. male patient admitted with "I do not really know".  HPI: Patient seen chart reviewed.  62 year old man was found at his hotel room where he has been staying.  Staff noticed a foul odor coming from the room.  Patient was found immobile in bed reportedly surrounded by empty liquor bottles.  Refused to come to the hospital for treatment which is what prompted involuntary commitment papers to be filed.  Patient is found to be profoundly anemic and to be in renal failure.  I found the patient awake and slightly cooperative.  Denies having any pain denies any real feeling of any physical symptoms right now.  He knows he is in the hospital.  He knows an ambulance brought him in but cannot articulate any reason why.  I asked him about drinking and he acted shocked as though it was astonishing to him that anyone would accused him of drinking.  I told him that the chart says he was surrounded by empty liquor bottles.  Patient scoffed at this and minimized his drinking saying that of course he took a nap sometimes but it was not something that was a problem.  Denied any suicidal thoughts denied having any hallucinations or DTs today.  Medical history: Patient is extremely anemic.  He has blood in his urine.  Short of breath chronic COPD now in renal failure  Social history:  Patient says he does not get along with his family.  Looking back to the chart as been an ongoing documented problem that he does not get along with his family will not let anyone get in touch with him.  Substance abuse history: Is been documented in the chart for years that alcohol abuse is a problem.  Patient denies ever having had seizures or DTs and denies even seeing it as a problem or ever being in any treatment for it.  Alcohol level on admission was negative  Past Psychiatric History: No past psychiatric history that he admits to at all and nothing in the chart no history of psychiatric contact hospitalization or treatment  Risk to Self:   Risk to Others:   Prior Inpatient Therapy:   Prior Outpatient Therapy:    Past Medical History:  Past Medical History:  Diagnosis Date  . COPD (chronic obstructive pulmonary disease) ([place])   . Tobacco abuse    No past surgical history on file. Family History:  Family History  Problem Relation Age of Onset  . Hypertension Neg Hx   . Diabetes Neg Hx    Family Psychiatric  History: Denies Social History:  Social History   Substance and Sexual Activity  Alcohol Use Yes  . Alcohol/week: 0.0 standard drinks   Comment: 2-3 times per week     Social History   Substance and Sexual Activity  Drug Use No    Social History   Socioeconomic History  .  Marital status: Married    Spouse name: Not on file  . Number of children: Not on file  . Years of education: Not on file  . Highest education level: Not on file  Occupational History  . Not on file  Social Needs  . Financial resource strain: Not on file  . Food insecurity:    Worry: Not on file    Inability: Not on file  . Transportation needs:    Medical: Not on file    Non-medical: Not on file  Tobacco Use  . Smoking status: Current Every Day Smoker    Packs/day: 1.00    Years: 35.00    Pack years: 35.00    Types: Cigarettes  Substance and Sexual Activity  . Alcohol use: Yes      Alcohol/week: 0.0 standard drinks    Comment: 2-3 times per week  . Drug use: No  . Sexual activity: Not on file  Lifestyle  . Physical activity:    Days per week: Not on file    Minutes per session: Not on file  . Stress: Not on file  Relationships  . Social connections:    Talks on phone: Not on file    Gets together: Not on file    Attends religious service: Not on file    Active member of club or organization: Not on file    Attends meetings of clubs or organizations: Not on file    Relationship status: Not on file  Other Topics Concern  . Not on file  Social History Narrative  . Not on file   Additional Social History:    Allergies:  No Known Allergies  Labs:  Results for orders placed or performed during the hospital encounter of 09/24/18 (from the past 48 hour(s))  Lactic acid, plasma     Status: Abnormal   Collection Time: 09/24/18  2:54 PM  Result Value Ref Range   Lactic Acid, Venous 8.4 (HH) 0.5 - 1.9 mmol/L    Comment: CRITICAL RESULT CALLED TO, READ BACK BY AND VERIFIED WITH ELLEN SLUECKIGER 09/24/18 1530 TFK Performed at [facility], [address] [place], [place] [address]   Comprehensive metabolic panel     Status: Abnormal   Collection Time: 09/24/18  2:54 PM  Result Value Ref Range   Sodium 164 (HH) 135 - 145 mmol/L    Comment: LYTES REPEATED CRITICAL RESULT CALLED TO, READ BACK BY AND VERIFIED WITH: ELLEN SLUECKIGER AT 1530 09/24/2018.  TFK    Potassium 4.6 3.5 - 5.1 mmol/L   Chloride 127 (H) 98 - 111 mmol/L   CO2 14 (L) 22 - 32 mmol/L   Glucose, Bld 181 (H) 70 - 99 mg/dL   BUN 171 (H) 8 - 23 mg/dL    Comment: RESULT CONFIRMED BY MANUAL DILUTION. JML   Creatinine, Ser 8.04 (H) 0.61 - 1.24 mg/dL   Calcium 8.6 (L) 8.9 - 10.3 mg/dL   Total Protein 6.7 6.5 - 8.1 g/dL   Albumin 2.5 (L) 3.5 - 5.0 g/dL   AST 48 (H) 15 - 41 U/L   ALT 11 0 - 44 U/L   Alkaline Phosphatase 179 (H) 38 - 126 U/L   Total Bilirubin 1.9 (H) 0.3 - 1.2 mg/dL    GFR calc non Af Amer 6 (L) >60 mL/min   GFR calc Af Amer 7 (L) >60 mL/min    Comment: (NOTE) The eGFR has been calculated using the CKD EPI equation. This calculation has not been validated  in all clinical situations. eGFR's persistently <60 mL/min signify possible Chronic Kidney Disease.    Anion gap 23 (H) 5 - 15    Comment: Performed at [facility], [address] [place], [place] [address]  Troponin I     Status: Abnormal   Collection Time: 09/24/18  2:54 PM  Result Value Ref Range   Troponin I 0.05 (HH) <0.03 ng/mL    Comment: CRITICAL RESULT CALLED TO, READ BACK BY AND VERIFIED WITH ELLEN SLUECKIGER 09/24/18  1530 TFK Performed at [facility], [address] [place], [place] [address]   CBC WITH DIFFERENTIAL     Status: Abnormal   Collection Time: 09/24/18  2:54 PM  Result Value Ref Range   WBC 9.9 3.8 - 10.6 K/uL   RBC 1.41 (L) 4.40 - 5.90 MIL/uL   Hemoglobin 3.8 (LL) 13.0 - 18.0 g/dL    Comment: RESULT REPEATED AND VERIFIED CRITICAL RESULT CALLED TO, READ BACK BY AND VERIFIED WITH: Leitha Bleak, RN AT [phone] 09/24/2018.  TFK    HCT 12.7 (LL) 40.0 - 52.0 %    Comment: RESULT REPEATED AND VERIFIED CRITICAL RESULT CALLED TO, READ BACK BY AND VERIFIED WITH: Leitha Bleak, RN AT [phone] 09/24/2018.  TFK    MCV 89.9 80.0 - 100.0 fL   MCH 27.2 26.0 - 34.0 pg   MCHC 30.2 (L) 32.0 - 36.0 g/dL   RDW 20.5 (H) 11.5 - 14.5 %   Platelets 68 (L) 150 - 440 K/uL   Neutrophils Relative % 78 %   Neutro Abs 7.6 (H) 1.4 - 6.5 K/uL   Lymphocytes Relative 20 %   Lymphs Abs 2.0 1.0 - 3.6 K/uL   Monocytes Relative 2 %   Monocytes Absolute 0.2 0.2 - 1.0 K/uL   Eosinophils Relative 0 %   Eosinophils Absolute 0.0 0 - 0.7 K/uL   Basophils Relative 0 %   Basophils Absolute 0.0 0 - 0.1 K/uL    Comment: Performed at [facility], [address], [place], [place] [address]  Blood Culture (routine x 2)     Status: None (Preliminary result)   Collection  Time: 09/24/18  2:54 PM  Result Value Ref Range   Specimen Description BLOOD RIGHT AC    Special Requests      BOTTLES DRAWN AEROBIC AND ANAEROBIC Blood Culture results may not be optimal due to an excessive volume of blood received in culture bottles   Culture      NO GROWTH < 24 HOURS Performed at [facility], [address], [place], [place] [address]    Report Status PENDING   CK     Status: None   Collection Time: 09/24/18  2:54 PM  Result Value Ref Range   Total CK 73 49 - 397 U/L    Comment: Performed at [facility], [address], [place], [place] [address]  ABO/Rh     Status: None   Collection Time: 09/24/18  2:54 PM  Result Value Ref Range   ABO/RH(D)      A NEG Performed at [facility], [place], [place] [address]   Urinalysis, Complete w Microscopic     Status: Abnormal   Collection Time: 09/24/18  2:55 PM  Result Value Ref Range   Color, Urine AMBER (A) YELLOW    Comment: BIOCHEMICALS MAY BE AFFECTED BY COLOR   APPearance CLOUDY (A) CLEAR   Specific Gravity, Urine 1.012 1.005 - 1.030   pH 5.0  5.0 - 8.0   Glucose, UA NEGATIVE NEGATIVE mg/dL   Hgb urine dipstick SMALL (A) NEGATIVE   Bilirubin Urine NEGATIVE NEGATIVE   Ketones, ur NEGATIVE NEGATIVE mg/dL   Protein, ur NEGATIVE NEGATIVE mg/dL   Nitrite NEGATIVE NEGATIVE   Leukocytes, UA MODERATE (A) NEGATIVE   RBC / HPF 6-10 0 - 5 RBC/hpf   WBC, UA 21-50 0 - 5 WBC/hpf   Bacteria, UA NONE SEEN NONE SEEN   Squamous Epithelial / LPF NONE SEEN 0 - 5   WBC Clumps PRESENT    Ca Oxalate Crys, UA PRESENT     Comment: Performed at [facility], [address], [place], [place] [address]  Urine Drug Screen, Qualitative ([facility] only)     Status: None   Collection Time: 09/24/18  2:55 PM  Result Value Ref Range   Tricyclic, Ur Screen NONE DETECTED NONE DETECTED   Amphetamines, Ur Screen NONE DETECTED NONE DETECTED   MDMA (Ecstasy)Ur Screen NONE DETECTED NONE  DETECTED   Cocaine Metabolite,Ur [place] NONE DETECTED NONE DETECTED   Opiate, Ur Screen NONE DETECTED NONE DETECTED   Phencyclidine (PCP) Ur S NONE DETECTED NONE DETECTED   Cannabinoid 50 Ng, Ur [place] NONE DETECTED NONE DETECTED   Barbiturates, Ur Screen NONE DETECTED NONE DETECTED   Benzodiazepine, Ur Scrn NONE DETECTED NONE DETECTED   Methadone Scn, Ur NONE DETECTED NONE DETECTED    Comment: (NOTE) Tricyclics + metabolites, urine    Cutoff 1000 ng/mL Amphetamines + metabolites, urine  Cutoff 1000 ng/mL MDMA (Ecstasy), urine              Cutoff 500 ng/mL Cocaine Metabolite, urine          Cutoff 300 ng/mL Opiate + metabolites, urine        Cutoff 300 ng/mL Phencyclidine (PCP), urine         Cutoff 25 ng/mL Cannabinoid, urine                 Cutoff 50 ng/mL Barbiturates + metabolites, urine  Cutoff 200 ng/mL Benzodiazepine, urine              Cutoff 200 ng/mL Methadone, urine                   Cutoff 300 ng/mL The urine drug screen provides only a preliminary, unconfirmed analytical test result and should not be used for non-medical purposes. Clinical consideration and professional judgment should be applied to any positive drug screen result due to possible interfering substances. A more specific alternate chemical method must be used in order to obtain a confirmed analytical result. Gas chromatography / mass spectrometry (GC/MS) is the preferred confirmat ory method. Performed at [facility], [address] [place], [place] [address]   Prepare RBC     Status: None   Collection Time: 09/24/18  4:00 PM  Result Value Ref Range   Order Confirmation      ORDER PROCESSED BY BLOOD BANK Performed at [facility], [address] [place], [place] [address]   Type and screen Ordered by PROVIDER DEFAULT     Status: None (Preliminary result)   Collection Time: 09/24/18  4:02 PM  Result Value Ref Range   ABO/RH(D) A NEG    Antibody Screen NEG    Sample Expiration  09/27/2018    Unit Number [id number]    Blood Component Type RED CELLS,LR    Unit division 00    Status of Unit ISSUED,FINAL  Unit tag comment EMERGENCY RELEASE    Transfusion Status OK TO TRANSFUSE    Crossmatch Result COMPATIBLE    Unit Number [id number]    Blood Component Type RED CELLS,LR    Unit division 00    Status of Unit ISSUED,FINAL    Unit tag comment EMERGENCY RELEASE    Transfusion Status OK TO TRANSFUSE    Crossmatch Result COMPATIBLE    Unit Number [id number]    Blood Component Type RED CELLS,LR    Unit division 00    Status of Unit ISSUED    Transfusion Status OK TO TRANSFUSE    Crossmatch Result Compatible    Unit Number [id number]    Blood Component Type RED CELLS,LR    Unit division 00    Status of Unit ISSUED    Transfusion Status OK TO TRANSFUSE    Crossmatch Result Compatible    Unit Number [id number]    Blood Component Type RED CELLS,LR    Unit division 00    Status of Unit ISSUED    Transfusion Status OK TO TRANSFUSE    Crossmatch Result      Compatible Performed at [facility], [address] [place], [place] [address]   Lactic acid, plasma     Status: Abnormal   Collection Time: 09/24/18  7:31 PM  Result Value Ref Range   Lactic Acid, Venous 4.5 (HH) 0.5 - 1.9 mmol/L    Comment: CRITICAL RESULT CALLED TO, READ BACK BY AND VERIFIED WITH JOSH [facility] 09/24/18 2020 JML Performed at [facility], [address] [place], [place] [address]   Comprehensive metabolic panel     Status: Abnormal   Collection Time: 09/24/18  7:31 PM  Result Value Ref Range   Sodium 161 (HH) 135 - 145 mmol/L    Comment: CRITICAL RESULT CALLED TO, READ BACK BY AND VERIFIED WITH JOSH [facility] 09/24/18 2020 JML    Potassium 4.0 3.5 - 5.1 mmol/L   Chloride >130 (HH) 98 - 111 mmol/L    Comment: CRITICAL RESULT CALLED TO, READ BACK BY AND VERIFIED WITH JOSH [facility] 09/24/18 2020 JML    CO2 17 (L) 22 - 32 mmol/L   Glucose,  Bld 179 (H) 70 - 99 mg/dL   BUN 155 (H) 8 - 23 mg/dL    Comment: RESULT CONFIRMED BY MANUAL DILUTION JML/SRC   Creatinine, Ser 6.66 (H) 0.61 - 1.24 mg/dL   Calcium 8.4 (L) 8.9 - 10.3 mg/dL   Total Protein 6.4 (L) 6.5 - 8.1 g/dL   Albumin 2.4 (L) 3.5 - 5.0 g/dL   AST 63 (H) 15 - 41 U/L   ALT 17 0 - 44 U/L   Alkaline Phosphatase 155 (H) 38 - 126 U/L   Total Bilirubin 1.8 (H) 0.3 - 1.2 mg/dL   GFR calc non Af Amer 8 (L) >60 mL/min   GFR calc Af Amer 9 (L) >60 mL/min    Comment: (NOTE) The eGFR has been calculated using the CKD EPI equation. This calculation has not been validated in all clinical situations. eGFR's persistently <60 mL/min signify possible Chronic Kidney Disease.    Anion gap UNABLE TO RESULT DUE TO NON NUMERIC VALUE [place] 5 - 15    Comment: Performed at [facility], [address] [place], [place] [address]  CBC with Differential/Platelet     Status: Abnormal   Collection Time: 09/24/18  7:31 PM  Result Value Ref Range   WBC 5.6 3.8 - 10.6 K/uL   RBC  2.59 (L) 4.40 - 5.90 MIL/uL   Hemoglobin 7.1 (L) 13.0 - 18.0 g/dL    Comment: RESULT REPEATED AND VERIFIED   HCT 21.7 (L) 40.0 - 52.0 %   MCV 83.5 80.0 - 100.0 fL    Comment: RESULT REPEATED AND VERIFIED   MCH 27.2 26.0 - 34.0 pg   MCHC 32.6 32.0 - 36.0 g/dL   RDW 17.2 (H) 11.5 - 14.5 %   Platelets 38 (L) 150 - 440 K/uL   Neutrophils Relative % 79 %   Neutro Abs 4.5 1.4 - 6.5 K/uL   Lymphocytes Relative 18 %   Lymphs Abs 1.0 1.0 - 3.6 K/uL   Monocytes Relative 2 %   Monocytes Absolute 0.1 (L) 0.2 - 1.0 K/uL   Eosinophils Relative 0 %   Eosinophils Absolute 0.0 0 - 0.7 K/uL   Basophils Relative 1 %   Basophils Absolute 0.0 0 - 0.1 K/uL    Comment: Performed at [facility], [address] [place], [place] [address]  Protime-INR     Status: Abnormal   Collection Time: 09/24/18  7:31 PM  Result Value Ref Range   Prothrombin Time 22.3 (H) 11.4 - 15.2 seconds   INR 1.98     Comment:  Performed at [facility], [address] [place], [place] [address]  APTT     Status: Abnormal   Collection Time: 09/24/18  7:31 PM  Result Value Ref Range   aPTT 44 (H) 24 - 36 seconds    Comment:        IF BASELINE aPTT IS ELEVATED, SUGGEST PATIENT RISK ASSESSMENT BE USED TO DETERMINE APPROPRIATE ANTICOAGULANT THERAPY. Performed at [facility], [address] [place], [place] [address]   Ethanol     Status: None   Collection Time: 09/24/18  7:31 PM  Result Value Ref Range   Alcohol, Ethyl (B) <10 <10 mg/dL    Comment: (NOTE) Lowest detectable limit for serum alcohol is 10 mg/dL. For medical purposes only. Performed at [facility], [place], [place] [address]   Procalcitonin - Baseline     Status: None   Collection Time: 09/24/18  7:31 PM  Result Value Ref Range   Procalcitonin 1.11 ng/mL    Comment:        Interpretation: PCT > 0.5 ng/mL and <= 2 ng/mL: Systemic infection (sepsis) is possible, but other conditions are known to elevate PCT as well. (NOTE)       Sepsis PCT Algorithm           Lower Respiratory Tract                                      Infection PCT Algorithm    ----------------------------     ----------------------------         PCT < 0.25 ng/mL                PCT < 0.10 ng/mL         Strongly encourage             Strongly discourage   discontinuation of antibiotics    initiation of antibiotics    ----------------------------     -----------------------------       PCT 0.25 - 0.50 ng/mL            PCT 0.10 - 0.25 ng/mL  OR       >80% decrease in PCT            Discourage initiation of                                            antibiotics      Encourage discontinuation           of antibiotics    ----------------------------     -----------------------------         PCT >= 0.50 ng/mL              PCT 0.26 - 0.50 ng/mL                AND       <80% decrease in PCT             Encourage  initiation of                                             antibiotics       Encourage continuation           of antibiotics    ----------------------------     -----------------------------        PCT >= 0.50 ng/mL                  PCT > 0.50 ng/mL               AND         increase in PCT                  Strongly encourage                                      initiation of antibiotics    Strongly encourage escalation           of antibiotics                                     -----------------------------                                           PCT <= 0.25 ng/mL                                                 OR                                        > 80% decrease in PCT                                     Discontinue / Do not initiate  antibiotics Performed at [facility], [place], [place] [address]   Acetaminophen level     Status: Abnormal   Collection Time: 09/24/18  7:31 PM  Result Value Ref Range   Acetaminophen (Tylenol), Serum <10 (L) 10 - 30 ug/mL    Comment: (NOTE) Therapeutic concentrations vary significantly. A range of 10-30 ug/mL  may be an effective concentration for many patients. However, some  are best treated at concentrations outside of this range. Acetaminophen concentrations >150 ug/mL at 4 hours after ingestion  and >50 ug/mL at 12 hours after ingestion are often associated with  toxic reactions. Performed at [facility], [address] [place], [place] [address]   Salicylate level     Status: None   Collection Time: 09/24/18  7:31 PM  Result Value Ref Range   Salicylate Lvl <7.5 2.8 - 30.0 mg/dL    Comment: Performed at [facility], [address] [place], [place] [address]  Ammonia     Status: Abnormal   Collection Time: 09/24/18  7:31 PM  Result Value Ref Range   Ammonia 42 (H) 9 - 35 umol/L    Comment: Performed at [facility], [address] [place], [place] [address]  Blood gas, arterial     Status: Abnormal   Collection Time: 09/24/18  7:40 PM  Result Value Ref Range   FIO2 0.21    Delivery systems ROOM AIR    pH, Arterial 7.45 7.350 - 7.450   pCO2 arterial 23 (L) 32.0 - 48.0 mmHg   pO2, Arterial 109 (H) 83.0 - 108.0 mmHg   Bicarbonate 16.0 (L) 20.0 - 28.0 mmol/L   Acid-base deficit 6.0 (H) 0.0 - 2.0 mmol/L   O2 Saturation 98.5 %   Patient temperature 37.0    Collection site LEFT RADIAL    Sample type ARTERIAL DRAW    Allens test (pass/fail) PASS PASS    Comment: Performed at [facility], [address], [place], [place] [address]  Prepare Pheresed Platelets     Status: None   Collection Time: 09/24/18  8:21 PM  Result Value Ref Range   Unit Number [id number]    Blood Component Type PLTPHER LR2    Unit division 00    Status of Unit ISSUED,FINAL    Transfusion Status      OK TO TRANSFUSE Performed at [facility], [address] [place], [place] [address]   MRSA PCR Screening     Status: None   Collection Time: 09/24/18  8:43 PM  Result Value Ref Range   MRSA by PCR NEGATIVE NEGATIVE    Comment:        The GeneXpert MRSA Assay (FDA approved for NASAL specimens only), is one component of a comprehensive MRSA colonization surveillance program. It is not intended to diagnose MRSA infection nor to guide or monitor treatment for MRSA infections. Performed at [facility], [address] [place], [place] [address]   Basic metabolic panel     Status: Abnormal   Collection Time: 09/24/18 10:45 PM  Result Value Ref Range   Sodium 161 (HH) 135 - 145 mmol/L    Comment: CRITICAL RESULT CALLED TO, READ BACK BY AND VERIFIED WITH JOSH WILLIAMSON 09/24/18 2310 JML    Potassium 3.5 3.5 - 5.1 mmol/L   Chloride >130 (HH) 98 - 111 mmol/L    Comment: CRITICAL RESULT CALLED TO, READ BACK BY AND VERIFIED WITH JOSH [facility] 09/24/18 2310 JML  CO2 19 (L) 22 - 32 mmol/L    Glucose, Bld 143 (H) 70 - 99 mg/dL   BUN 138 (H) 8 - 23 mg/dL    Comment: RESULT CONFIRMED BY MANUAL DILUTION JML/SRC   Creatinine, Ser 5.68 (H) 0.61 - 1.24 mg/dL   Calcium 7.6 (L) 8.9 - 10.3 mg/dL   GFR calc non Af Amer 10 (L) >60 mL/min   GFR calc Af Amer 11 (L) >60 mL/min    Comment: (NOTE) The eGFR has been calculated using the CKD EPI equation. This calculation has not been validated in all clinical situations. eGFR's persistently <60 mL/min signify possible Chronic Kidney Disease.    Anion gap UNABLE TO CALCULATE DUE TO NON NUMERIC VALUE 5 - 15    Comment: Performed at [facility], [address] [place], [place] [address]  Lactic acid, plasma     Status: Abnormal   Collection Time: 09/24/18 10:45 PM  Result Value Ref Range   Lactic Acid, Venous 2.8 (HH) 0.5 - 1.9 mmol/L    Comment: CRITICAL RESULT CALLED TO, READ BACK BY AND VERIFIED WITH JOSH WILLIAMSON ON 09/24/18 AT 2334 [facility] Performed at [facility], [address] [place], [place] [address]   Glucose, capillary     Status: Abnormal   Collection Time: 09/24/18 11:06 PM  Result Value Ref Range   Glucose-Capillary 144 (H) 70 - 99 mg/dL  Hemoglobin and hematocrit, blood     Status: Abnormal   Collection Time: 09/25/18 12:53 AM  Result Value Ref Range   Hemoglobin 5.1 (L) 13.0 - 18.0 g/dL   HCT 15.1 (L) 40.0 - 52.0 %    Comment: Performed at [facility], [address], [place], [place] [address]  Prepare RBC     Status: None   Collection Time: 09/25/18  2:26 AM  Result Value Ref Range   Order Confirmation      ORDER PROCESSED BY BLOOD BANK Performed at [facility], [address] [place], [place] [address]   Prepare Pheresed Platelets     Status: None (Preliminary result)   Collection Time: 09/25/18  2:28 AM  Result Value Ref Range   Unit Number [id number]    Blood Component Type PLTP LR2 PAS    Unit division 00    Status of Unit ALLOCATED    Transfusion Status        OK TO TRANSFUSE Performed at [facility], [address] [place], [place] [address]   Glucose, capillary     Status: Abnormal   Collection Time: 09/25/18  3:11 AM  Result Value Ref Range   Glucose-Capillary 161 (H) 70 - 99 mg/dL  Basic metabolic panel     Status: Abnormal   Collection Time: 09/25/18  4:42 AM  Result Value Ref Range   Sodium 157 (H) 135 - 145 mmol/L   Potassium 3.7 3.5 - 5.1 mmol/L   Chloride 129 (H) 98 - 111 mmol/L   CO2 18 (L) 22 - 32 mmol/L   Glucose, Bld 197 (H) 70 - 99 mg/dL   BUN 137 (H) 8 - 23 mg/dL    Comment: RESULT CONFIRMED BY MANUAL DILUTION SDR   Creatinine, Ser 5.60 (H) 0.61 - 1.24 mg/dL   Calcium 7.9 (L) 8.9 - 10.3 mg/dL   GFR calc non Af Amer 10 (L) >60 mL/min   GFR calc Af Amer 11 (L) >60 mL/min    Comment: (NOTE) The eGFR has been calculated using the CKD EPI equation. This  calculation has not been validated in all clinical situations. eGFR's persistently <60 mL/min signify possible Chronic Kidney Disease.    Anion gap 10 5 - 15    Comment: Performed at [facility], [address] [place], [place] [address]  CBC     Status: Abnormal   Collection Time: 09/25/18  4:42 AM  Result Value Ref Range   WBC 8.2 3.8 - 10.6 K/uL   RBC 2.43 (L) 4.40 - 5.90 MIL/uL   Hemoglobin 6.8 (L) 13.0 - 18.0 g/dL    Comment: RESULT REPEATED AND VERIFIED   HCT 19.9 (L) 40.0 - 52.0 %   MCV 81.7 80.0 - 100.0 fL   MCH 28.1 26.0 - 34.0 pg   MCHC 34.4 32.0 - 36.0 g/dL   RDW 17.6 (H) 11.5 - 14.5 %   Platelets 81 (L) 150 - 440 K/uL    Comment: Performed at [facility], [address] [place],  [address]  Procalcitonin     Status: None   Collection Time: 09/25/18  4:42 AM  Result Value Ref Range   Procalcitonin 1.17 ng/mL    Comment:        Interpretation: PCT > 0.5 ng/mL and <= 2 ng/mL: Systemic infection (sepsis) is possible, but other conditions are known to elevate PCT as well. (NOTE)       Sepsis PCT Algorithm            Lower Respiratory Tract                                      Infection PCT Algorithm    ----------------------------     ----------------------------         PCT < 0.25 ng/mL                PCT < 0.10 ng/mL         Strongly encourage             Strongly discourage   discontinuation of antibiotics    initiation of antibiotics    ----------------------------     -----------------------------       PCT 0.25 - 0.50 ng/mL            PCT 0.10 - 0.25 ng/mL               OR       >80% decrease in PCT            Discourage initiation of                                            antibiotics      Encourage discontinuation           of antibiotics    ----------------------------     -----------------------------         PCT >= 0.50 ng/mL              PCT 0.26 - 0.50 ng/mL                AND       <80% decrease in PCT             Encourage initiation of  antibiotics       Encourage continuation           of antibiotics    ----------------------------     -----------------------------        PCT >= 0.50 ng/mL                  PCT > 0.50 ng/mL               AND         increase in PCT                  Strongly encourage                                      initiation of antibiotics    Strongly encourage escalation           of antibiotics                                     -----------------------------                                           PCT <= 0.25 ng/mL                                                 OR                                        > 80% decrease in PCT                                     Discontinue / Do not initiate                                             antibiotics Performed at [facility], [address] [place], [place] [address]   CK     Status: Abnormal   Collection Time: 09/25/18  4:42 AM  Result Value Ref Range   Total CK 43 (L) 49 - 397 U/L    Comment: Performed at [facility], [address] [place], [place] [address]  Glucose, capillary     Status: Abnormal   Collection Time: 09/25/18  7:48 AM  Result Value Ref Range   Glucose-Capillary 138 (H) 70 - 99 mg/dL  Basic metabolic panel     Status: Abnormal   Collection Time: 09/25/18  9:52 AM  Result Value Ref Range   Sodium 155 (H) 135 - 145 mmol/L   Potassium 3.8 3.5 - 5.1 mmol/L   Chloride 126 (H) 98 - 111 mmol/L   CO2 18 (L) 22 - 32 mmol/L   Glucose, Bld 133 (H) 70 - 99 mg/dL   BUN 134 (H) 8 - 23 mg/dL    Comment:  RESULT CONFIRMED BY MANUAL DILUTION...[place]   Creatinine, Ser 5.32 (H) 0.61 - 1.24 mg/dL   Calcium 7.9 (L) 8.9 - 10.3 mg/dL   GFR calc non Af Amer 11 (L) >60 mL/min   GFR calc Af Amer 12 (L) >60 mL/min    Comment: (NOTE) The eGFR has been calculated using the CKD EPI equation. This calculation has not been validated in all clinical situations. eGFR's persistently <60 mL/min signify possible Chronic Kidney Disease.    Anion gap 11 5 - 15    Comment: Performed at [facility], [address] [place], [place] [address]  Hemoglobin and hematocrit, blood     Status: Abnormal   Collection Time: 09/25/18  9:52 AM  Result Value Ref Range   Hemoglobin 7.9 (L) 13.0 - 18.0 g/dL   HCT 22.7 (L) 40.0 - 52.0 %    Comment: Performed at [facility], [address] [place], [place] [address]  Glucose, capillary     Status: Abnormal   Collection Time: 09/25/18 11:49 AM  Result Value Ref Range   Glucose-Capillary 122 (H) 70 - 99 mg/dL  Glucose, capillary     Status: Abnormal   Collection Time: 09/25/18  4:57 PM  Result Value Ref Range   Glucose-Capillary 166 (H) 70 - 99 mg/dL    Current Facility-Administered Medications  Medication Dose Route Frequency Provider Last Rate Last Dose  . 0.9 %  sodium chloride infusion (Manually program via Guardrails IV Fluids)   Intravenous Once Awilda Bill, NP      . 0.9 %  sodium chloride infusion (Manually program via Guardrails IV Fluids)    Intravenous Once Awilda Bill, NP      . 0.9 %  sodium chloride infusion (Manually program via Guardrails IV Fluids)   Intravenous Once Awilda Bill, NP      . cefTRIAXone (ROCEPHIN) 2 g in sodium chloride 0.9 % 100 mL IVPB  2 g Intravenous q1800 Charlett Nose, RPH 200 mL/hr at 09/25/18 1855 2 g at 09/25/18 1855  . dexmedetomidine (PRECEDEX) 400 MCG/100ML (4 mcg/mL) infusion  0.4-1.2 mcg/kg/hr Intravenous Continuous Awilda Bill, NP   Stopped at 09/25/18 1145  . docusate sodium (COLACE) capsule 100 mg  100 mg Oral BID Epifanio Lesches, MD      . folic acid injection 1 mg  1 mg Intravenous Daily Awilda Bill, NP   1 mg at 09/25/18 1318  . insulin aspart (novoLOG) injection 0-9 Units  0-9 Units Subcutaneous Q4H Awilda Bill, NP   1 Units at 09/25/18 5997  . lidocaine (XYLOCAINE) 2 % jelly 1 application  1 application Urethral Once Awilda Bill, NP      . lidocaine (XYLOCAINE) 2 % jelly 1 application  1 application Urethral STAT Awilda Bill, NP      . LORazepam (ATIVAN) injection 2-3 mg  2-3 mg Intravenous Q1H PRN Flora Lipps, MD      . norepinephrine (LEVOPHED) 16 mg in dextrose 5 % 250 mL (0.064 mg/mL) infusion  0-40 mcg/min Intravenous Titrated Awilda Bill, NP   Stopped at 09/25/18 1622  . octreotide (SANDOSTATIN) 500 mcg in sodium chloride 0.9 % 250 mL (2 mcg/mL) infusion  25-50 mcg/hr Intravenous Continuous Epifanio Lesches, MD 12.5 mL/hr at 09/25/18 1559 25 mcg/hr at 09/25/18 1559  . ondansetron (ZOFRAN) tablet 4 mg  4 mg Oral Q6H PRN Epifanio Lesches, MD       Or  . ondansetron (ZOFRAN) injection 4 mg  4 mg Intravenous Q6H PRN Epifanio Lesches, MD      . pantoprazole (PROTONIX) 80 mg in sodium chloride 0.9 % 250 mL (0.32 mg/mL) infusion  8 mg/hr Intravenous Continuous Epifanio Lesches, MD 25 mL/hr at 09/25/18 0810 8 mg/hr at 09/25/18 0810  . [START ON 09/28/2018] pantoprazole (PROTONIX) injection 40 mg  40 mg Intravenous Q12H Epifanio Lesches, MD      . sodium chloride flush (NS) 0.9 % injection 10-40 mL  10-40 mL Intracatheter Q12H Epifanio Lesches, MD   10 mL at 09/25/18 1330  . sodium chloride flush (NS) 0.9 % injection 10-40 mL  10-40 mL Intracatheter PRN Epifanio Lesches, MD      . thiamine (B-1) injection 100 mg  100 mg Intravenous Daily Awilda Bill, NP   100 mg at 09/25/18 1030    Musculoskeletal: Strength & Muscle Tone: decreased Gait & Station: unable to stand Patient leans: N/A  Psychiatric Specialty Exam: Physical Exam  Nursing note and vitals reviewed. Constitutional: He appears well-developed.  HENT:  Head: Normocephalic and atraumatic.  Eyes: Pupils are equal, round, and reactive to light. Conjunctivae are normal.  Neck: Normal range of motion.  Cardiovascular: Normal heart sounds.  Respiratory: Effort normal.  GI: Soft.  Genitourinary:     Musculoskeletal: Normal range of motion.  Neurological: He is alert.  Skin: Skin is warm and dry.  Psychiatric: His affect is blunt. His speech is delayed. He is slowed. Thought content is not paranoid. Cognition and memory are impaired. He expresses impulsivity. He expresses no homicidal and no suicidal ideation. He exhibits abnormal recent memory.    Review of Systems  Constitutional: Negative.   HENT: Negative.   Eyes: Negative.   Respiratory: Negative.   Cardiovascular: Negative.   Gastrointestinal: Negative.   Musculoskeletal: Negative.   Skin: Negative.   Neurological: Negative.   Psychiatric/Behavioral: Negative for depression, hallucinations, memory loss, substance abuse and suicidal ideas. The patient is not nervous/anxious and does not have insomnia.     Blood pressure 111/76, pulse 68, temperature (!) 97.5 F (36.4 C), temperature source Axillary, resp. rate 18, height 5' 7"  (1.702 m), weight 52.8 kg, SpO2 100 %.Body mass index is 18.23 kg/m.  General Appearance: Disheveled  Eye Contact:  Minimal  Speech:  Garbled and  Slurred  Volume:  Decreased  Mood:  Euthymic  Affect:  Constricted  Thought Process:  Disorganized  Orientation:  Negative  Thought Content:  Illogical  Suicidal Thoughts:  No  Homicidal Thoughts:  No  Memory:  Immediate;   Fair Recent;   Poor Remote;   Poor  Judgement:  Poor  Insight:  Shallow  Psychomotor Activity:  Decreased  Concentration:  Concentration: Poor  Recall:  Poor  Fund of Knowledge:  Poor  Language:  Poor  Akathisia:  No  Handed:  Right  AIMS (if indicated):     Assets:  Desire for Improvement  ADL's:  Impaired  Cognition:  Impaired,  Mild  Sleep:        Treatment Plan Summary: Medication management and Plan Patient with a history of alcohol abuse now with multiple medical problems.  He has not had a seizure and does not appear to be in DTs.  He might still be at risk for DTs but is on appropriate medication.  Patient is upbeat and completely denies suicidal ideation.  There is no indication for involuntary commitment.  I discontinue the commitment paperwork and put in an order to discontinue that.  He does not need  a sitter for commitment although if he needs one for agitation or safety that is a different issue.  Probably will not need or cooperate with any mental health treatment but I will follow-up as needed.  Disposition: No evidence of imminent risk to self or others at present.   Patient does not meet criteria for psychiatric inpatient admission. Supportive therapy provided about ongoing stressors.  Alethia Berthold, MD 09/25/2018 6:58 PM

## 2018-09-25 NOTE — Progress Notes (Signed)
Initially pt had a 16 french foley catheter in place requiring intermittent bladder irrigation due to hematuria with sufficient urinary output of 600 ml at the beginning of the shift.  However, urinary output declined significantly pt with significant lower abdominal distension bladder scan revealed 937 ml of urine  concerning for possible clots occluding foley catheter despite intermittent bladder irrigation.  Therefore, replaced foley catheter with 18 french 3-way catheter for CBI during insertion bloody urine returned and balloon inflated with 10 ml of saline.  However, urinary flow subsided and pts bladder remained distended.  I spoke with urologist Dr. Louis Meckel via telephone regarding concerns of hematuria and significant decline in urinary output.  He recommended attempting to place a 22 french 3-way catheter after inserting urojet.  Attempted to place 22 french 3-way catheter, however bright red blood drained from catheter.  I spoke with Dr. Louis Meckel again and informed him second foley attempt returned bright red bloody drainage, irrigated foley and during irrigation pt had significant pain.  He recommended attempting to hub foley catheter and if resistance met abort procedure.  Dr. Carlton Adam stated he would ask one of his partners to see the pt today 10/1.  Attempted to hub foley catheter but due to resistance I did not inflate the balloon and foley catheter removed.  I spoke with Dr. Louis Meckel again regarding inability to place foley he stated Dr. Chari Manning will examine the pt today.  Marda Stalker, [place] Pager [phone] (please enter 7 digits) PCCM Consult Pager [phone] (please enter 7 digits)

## 2018-09-25 NOTE — Consult Note (Addendum)
Urology Consult  I have been asked to see the patient by Marda Stalker, for evaluation and management of gross hematuria.  Chief Complaint: N/A  History of Present Illness: Joseph Trevino is a 62 y.o. year old male admitted for altered mental status and possible sepsis after being found unresponsive [place] local motel.  His initial urinalysis after catheter placement was cloudy but clear and had pyuria.  He subsequently developed gross hematuria and required intermittent irrigation of his catheter.  He developed lower abdominal distention and an 31 French three-way catheter was placed to CBI which subsequently clotted.  Attempts at placing a 22 [place] three-way Foley catheter by ICU staff was unsuccessful.  No past medical history was able to be obtained due to his mental status.  A noncontrast CT of the abdomen and pelvis was performed which showed mild to moderate bilateral hydronephrosis and moderate bilateral hydroureter to the level of a distended bladder with a Foley catheter in place.  He was incidentally noted to have diffuse patchy bone sclerosis felt compatible with metastatic prostate cancer.    Past Medical History:  Diagnosis Date  . COPD (chronic obstructive pulmonary disease) ([place])   . Tobacco abuse     Home Medications:  No outpatient medications have been marked as taking for the 09/24/18 encounter [facility] Encounter).    Allergies: No Known Allergies  Family History  Problem Relation Age of Onset  . Hypertension Neg Hx   . Diabetes Neg Hx     Social History:  reports that he has been smoking cigarettes. He has a 35.00 pack-year smoking history. He does not have any smokeless tobacco history on file. He reports that he drinks alcohol. He reports that he does not use drugs.  ROS: A complete review of systems was unable to be obtained  Physical Exam:  Vital signs in last 24 hours: Temp:  [95.2 F (35.1 C)-97.9 F (36.6 C)] 97.5 F (36.4 C) (10/01  1637) Pulse Rate:  [53-103] 54 (10/01 1637) Resp:  [12-30] 12 (10/01 1637) BP: (79-134)/(57-81) 94/63 (10/01 1637) SpO2:  [94 %-100 %] 98 % (10/01 1637) Weight:  [52.8 kg] 52.8 kg (09/30 1900) Constitutional: Lethargic HEENT: [place] AT, moist mucus membranes.  Trachea midline, no masses Cardiovascular: Regular rate and rhythm, no clubbing, cyanosis, or edema. Respiratory: Normal respiratory effort, lungs clear bilaterally GI: Abdomen is distended with firm suprapubic mass GU: No CVA tenderness.  Penis with mild edema.  Testes descended bilaterally without masses or tenderness.  Prostate exam deferred at this time Skin: No rashes, bruises or suspicious lesions Lymph: No cervical or inguinal adenopathy Neurologic: Grossly intact, moving all 4 extremities   Laboratory Data:  Recent Labs    09/24/18 1454 09/24/18 1931 09/25/18 0053 09/25/18 0442 09/25/18 0952  WBC 9.9 5.6  --  8.2  --   HGB 3.8* 7.1* 5.1* 6.8* 7.9*  HCT 12.7* 21.7* 15.1* 19.9* 22.7*   Recent Labs    09/24/18 2245 09/25/18 0442 09/25/18 0952  NA 161* 157* 155*  K 3.5 3.7 3.8  CL >130* 129* 126*  CO2 19* 18* 18*  GLUCOSE 143* 197* 133*  BUN 138* 137* 134*  CREATININE 5.68* 5.60* 5.32*  CALCIUM 7.6* 7.9* 7.9*   Recent Labs    09/24/18 1931  INR 1.98   No results for input(s): LABURIN in the last 72 hours. Results for orders placed or performed during the hospital encounter of 09/24/18  Blood Culture (routine x 2)  Status: None (Preliminary result)   Collection Time: 09/24/18  2:54 PM  Result Value Ref Range Status   Specimen Description BLOOD RIGHT AC  Final   Special Requests   Final    BOTTLES DRAWN AEROBIC AND ANAEROBIC Blood Culture results may not be optimal due to an excessive volume of blood received in culture bottles   Culture   Final    NO GROWTH < 24 HOURS Performed at [facility], [address] [place], [place] [address]    Report Status PENDING  Incomplete  MRSA PCR  Screening     Status: None   Collection Time: 09/24/18  8:43 PM  Result Value Ref Range Status   MRSA by PCR NEGATIVE NEGATIVE Final    Comment:        The GeneXpert MRSA Assay (FDA approved for NASAL specimens only), is one component of a comprehensive MRSA colonization surveillance program. It is not intended to diagnose MRSA infection nor to guide or monitor treatment for MRSA infections. Performed at [facility], [address] [place], [place] [address]      Radiologic Imaging: Ct Abdomen Pelvis Wo Contrast  Result Date: 09/24/2018 CLINICAL DATA:  Altered mental status. Found on the floor. EXAM: CT ABDOMEN AND PELVIS WITHOUT CONTRAST TECHNIQUE: Multidetector CT imaging of the abdomen and pelvis was performed following the standard protocol without IV contrast. COMPARISON:  None. FINDINGS: Lower chest: Clear lung bases. Hepatobiliary: Multiple small gallstones in the dependent portion of the gallbladder, measuring up to 5 mm in maximum diameter each. No gallbladder wall thickening or pericholecystic fluid. Somewhat small liver with the exception of mildly prominent lateral segment left lobe and caudate lobe. The liver has mildly lobulated contours. Pancreas: Unremarkable. No pancreatic ductal dilatation or surrounding inflammatory changes. Spleen: Mildly prominent without splenomegaly. Adrenals/Urinary Tract: Mild to moderate dilatation of both renal collecting systems and moderate dilatation of both ureters to the level of the urinary bladder. Foley catheter in the bladder with associated air in the bladder. The bladder is distended, despite presence of the Foley catheter. No calculi seen. Normal appearing adrenal glands. Stomach/Bowel: Unremarkable stomach, small bowel and colon. No evidence of appendicitis. Vascular/Lymphatic: Atheromatous calcifications. Small focal infrarenal abdominal aortic aneurysm measuring 3.1 cm in maximum diameter on image number 42 series 2. No  enlarged lymph nodes. Reproductive: Prostate is unremarkable. Other: Minimal free peritoneal fluid adjacent to the liver. Small to moderate-sized umbilical hernia containing fat. Musculoskeletal: Diffuse patchy sclerosis of the bones. IMPRESSION: 1. Mild to moderate bilateral hydronephrosis and moderate bilateral hydroureter to the level of a mildly distended urinary bladder, despite presence of a Foley catheter. No obstructing calculi are seen. 2. Diffuse patchy bone sclerosis with appearance compatible with metastatic prostate cancer. 3. Cholelithiasis without evidence of cholecystitis. 4. Findings suggesting cirrhosis of the liver with minimal ascites. 5. 3.1 cm infrarenal abdominal aortic aneurysm. Recommend followup by ultrasound in 3 years. This recommendation follows ACR consensus guidelines: White Paper of the ACR Incidental Findings Committee II on Vascular Findings. Joellyn Rued Radiol 2013; [phone] Electronically Signed   By: Claudie Revering M.D.   On: 09/24/2018 19:10   Ct Head Wo Contrast  Result Date: 09/24/2018 CLINICAL DATA:  Altered mental status EXAM: CT HEAD WITHOUT CONTRAST TECHNIQUE: Contiguous axial images were obtained from the base of the skull through the vertex without intravenous contrast. COMPARISON:  None. FINDINGS: Brain: Diffusely enlarged ventricles and subarachnoid spaces. Patchy white matter low density in both cerebral hemispheres. No intracranial hemorrhage,  mass lesion or CT evidence of acute infarction. Vascular: No hyperdense vessel or unexpected calcification. Skull: Normal. Negative for fracture or focal lesion. Sinuses/Orbits: Status post bilateral cataract extraction. Unremarkable paranasal sinuses. Other: None. IMPRESSION: 1. No acute abnormality. 2. Moderate diffuse cerebral and cerebellar atrophy and mild chronic small vessel white matter ischemic changes in both cerebral hemispheres. Electronically Signed   By: Claudie Revering M.D.   On: 09/24/2018 19:01   Dg Chest Port  1 View  Result Date: 09/24/2018 CLINICAL DATA:  62 year old male with sepsis. EXAM: PORTABLE CHEST 1 VIEW COMPARISON:  Chest radiographs 04/20/2016 and earlier. FINDINGS: Pacer or resuscitation pads projecting over the central chest. Somewhat large lung volumes. Mediastinal contours remain normal. Visualized tracheal air column is within normal limits. Allowing for portable technique the lungs are clear. No pneumothorax. Questionable heterogeneous bone mineralization throughout the visible chest and spine. This might be artifact due to technique. Paucity of bowel gas in the upper abdomen. IMPRESSION: 1.  No acute cardiopulmonary abnormality. 2. Questionable new generalized abnormal sclerosis in the visible ribs and spine. This may be artifact but sclerotic metastatic disease to bone is difficult to exclude. Electronically Signed   By: Genevie Ann M.D.   On: 09/24/2018 16:31   Catheter placement: External genitalia were prepped and draped sterilely.  Due to the possibility of a false passage with prior attempts at 69 French catheter placement a 0.035 Glidewire was placed per meatus and advanced without difficulty after instilling 2% lidocaine gel.  A 24 French three-way Coulvelaire hematuria catheter was placed over the wire without difficulty.  Approximately 900 mL of moderate bloody urine was obtained.  The catheter was irrigated with return of several clots.  The catheter was placed to CBI.  I was called later this afternoon stating that the catheter was not draining.  Once I came to the ICU the CBI was running and the catheter was draining.  Several syringe fulls of clot were evacuated evacuated with manual irrigation.  The catheter was placed back to CBI with medium bloody effluent.   Impression/Assessment:  1.  Gross hematuria with potential etiologies including relief of a chronically obstructed bladder, urothelial carcinoma, BPH or prostate cancer invading the bladder.  2.  Bilateral  hydronephrosis/hydroureter-may be secondary to urinary retention  3.  Bony findings in the pelvis on CT felt suspicious for metastatic prostate cancer.  A PSA has been ordered however will be unreliable due to recent catheterization and probable urinary tract infection.  Recommendation:  1.  Continue CBI.  Irrigate manually as needed.  For persistent hematuria that does not resolve on CBI may need cystoscopy under anesthesia with clot evacuation/fulguration.  2.  Follow renal function.  If renal function improves follow-up renal ultrasound in approximately 3 days.  If renal function not improving would reimage and consider percutaneous nephrostomy if he does not require cystoscopy for persistent hematuria  3.  DRE once awake and alert.  An oncology consult was all so placed.  4.  Will follow   09/25/2018, 5:53 PM  John Giovanni,  MD

## 2018-09-25 NOTE — Consult Note (Signed)
[place]  [phone] [phone]  ID: Joseph Trevino OB: 05/27/1956  MR#: [id number]  LGX#:[id number]  Patient Care Team: Patient, [name] as PCP - General (General Practice)  CHIEF COMPLAINT: Sclerotic bony lesions suspicious for metastatic disease, severe anemia and thrombocytopenia.  INTERVAL HISTORY: Patient is a 62 year old male who was involuntary committed after being admitted for altered mental status.  He was noted to have severe metabolic derangements, anemia, acute renal failure, thrombocytopenia.  CT scan also revealed widespread bony sclerotic lesions highly suspicious for metastatic disease.  Review of systems is unobtainable as patient is not answering any questions.  REVIEW OF SYSTEMS:   Review of Systems  Unable to perform ROS: Critical illness    PAST MEDICAL HISTORY: Past Medical History:  Diagnosis Date  . COPD (chronic obstructive pulmonary disease) ([place])   . Tobacco abuse     PAST SURGICAL HISTORY: No past surgical history on file.  FAMILY HISTORY: Family History  Problem Relation Age of Onset  . Hypertension Neg Hx   . Diabetes Neg Hx     ADVANCED DIRECTIVES (Y/N):  @ADVDIR @  HEALTH MAINTENANCE: Social History   Tobacco Use  . Smoking status: Current Every Day Smoker    Packs/day: 1.00    Years: 35.00    Pack years: 35.00    Types: Cigarettes  Substance Use Topics  . Alcohol use: Yes    Alcohol/week: 0.0 standard drinks    Comment: 2-3 times per week  . Drug use: No     Colonoscopy:  PAP:  Bone density:  Lipid panel:  No Known Allergies  Current Facility-Administered Medications  Medication Dose Route Frequency Provider Last Rate Last Dose  . 0.9 %  sodium chloride infusion (Manually program via Guardrails IV Fluids)   Intravenous Once Awilda Bill, NP      . 0.9 %  sodium chloride infusion (Manually program via Guardrails IV Fluids)   Intravenous Once Awilda Bill, NP      .  0.9 %  sodium chloride infusion (Manually program via Guardrails IV Fluids)   Intravenous Once Awilda Bill, NP      . cefTRIAXone (ROCEPHIN) 2 g in sodium chloride 0.9 % 100 mL IVPB  2 g Intravenous q1800 Charlett Nose, RPH 200 mL/hr at 09/25/18 1855 2 g at 09/25/18 1855  . dexmedetomidine (PRECEDEX) 400 MCG/100ML (4 mcg/mL) infusion  0.4-1.2 mcg/kg/hr Intravenous Continuous Awilda Bill, NP   Stopped at 09/25/18 1145  . docusate sodium (COLACE) capsule 100 mg  100 mg Oral BID Epifanio Lesches, MD      . folic acid injection 1 mg  1 mg Intravenous Daily Awilda Bill, NP   1 mg at 09/25/18 1318  . insulin aspart (novoLOG) injection 0-9 Units  0-9 Units Subcutaneous Q4H Awilda Bill, NP   1 Units at 09/25/18 8144  . lidocaine (XYLOCAINE) 2 % jelly 1 application  1 application Urethral Once Awilda Bill, NP      . lidocaine (XYLOCAINE) 2 % jelly 1 application  1 application Urethral STAT Awilda Bill, NP      . LORazepam (ATIVAN) injection 2-3 mg  2-3 mg Intravenous Q1H PRN Flora Lipps, MD      . norepinephrine (LEVOPHED) 16 mg in dextrose 5 % 250 mL (0.064 mg/mL) infusion  0-40 mcg/min Intravenous Titrated Awilda Bill, NP   Stopped at 09/25/18 1622  . octreotide (SANDOSTATIN) 500 mcg in sodium chloride 0.9 %  250 mL (2 mcg/mL) infusion  25-50 mcg/hr Intravenous Continuous Epifanio Lesches, MD 12.5 mL/hr at 09/25/18 1559 25 mcg/hr at 09/25/18 1559  . ondansetron (ZOFRAN) tablet 4 mg  4 mg Oral Q6H PRN Epifanio Lesches, MD       Or  . ondansetron (ZOFRAN) injection 4 mg  4 mg Intravenous Q6H PRN Epifanio Lesches, MD      . pantoprazole (PROTONIX) 80 mg in sodium chloride 0.9 % 250 mL (0.32 mg/mL) infusion  8 mg/hr Intravenous Continuous Epifanio Lesches, MD 25 mL/hr at 09/25/18 0810 8 mg/hr at 09/25/18 0810  . [START ON 09/28/2018] pantoprazole (PROTONIX) injection 40 mg  40 mg Intravenous Q12H Epifanio Lesches, MD      . sodium chloride flush  (NS) 0.9 % injection 10-40 mL  10-40 mL Intracatheter Q12H Epifanio Lesches, MD   10 mL at 09/25/18 1330  . sodium chloride flush (NS) 0.9 % injection 10-40 mL  10-40 mL Intracatheter PRN Epifanio Lesches, MD      . thiamine (B-1) injection 100 mg  100 mg Intravenous Daily Awilda Bill, NP   100 mg at 09/25/18 1030    OBJECTIVE: Vitals:   09/25/18 1800 09/25/18 1900  BP: 111/76 (!) 90/59  Pulse: 68 (!) 55  Resp: 18 11  Temp:    SpO2: 100% 100%     Body mass index is 18.23 kg/m.    ECOG FS:4 - Bedbound  General: Cachectic, ill-appearing, no acute distress. Eyes: Pink conjunctiva, anicteric sclera. HEENT: Normocephalic, moist mucous membranes, clear oropharnyx. Lungs: Clear to auscultation bilaterally. Heart: Regular rate and rhythm. No rubs, murmurs, or gallops. Abdomen: Soft, nontender, nondistended. No organomegaly noted, normoactive bowel sounds. Musculoskeletal: No edema, cyanosis, or clubbing. Neuro: Confused. Cranial nerves grossly intact. Skin: No rashes or petechiae noted.  LAB RESULTS:  Lab Results  Component Value Date   NA 155 (H) 09/25/2018   K 3.8 09/25/2018   CL 126 (H) 09/25/2018   CO2 18 (L) 09/25/2018   GLUCOSE 133 (H) 09/25/2018   BUN 134 (H) 09/25/2018   CREATININE 5.32 (H) 09/25/2018   CALCIUM 7.9 (L) 09/25/2018   PROT 6.4 (L) 09/24/2018   ALBUMIN 2.4 (L) 09/24/2018   AST 63 (H) 09/24/2018   ALT 17 09/24/2018   ALKPHOS 155 (H) 09/24/2018   BILITOT 1.8 (H) 09/24/2018   GFRNONAA 11 (L) 09/25/2018   GFRAA 12 (L) 09/25/2018    Lab Results  Component Value Date   WBC 8.2 09/25/2018   NEUTROABS 4.5 09/24/2018   HGB 7.9 (L) 09/25/2018   HCT 22.7 (L) 09/25/2018   MCV 81.7 09/25/2018   PLT 81 (L) 09/25/2018     STUDIES: Ct Abdomen Pelvis Wo Contrast  Result Date: 09/24/2018 CLINICAL DATA:  Altered mental status. Found on the floor. EXAM: CT ABDOMEN AND PELVIS WITHOUT CONTRAST TECHNIQUE: Multidetector CT imaging of the abdomen and  pelvis was performed following the standard protocol without IV contrast. COMPARISON:  None. FINDINGS: Lower chest: Clear lung bases. Hepatobiliary: Multiple small gallstones in the dependent portion of the gallbladder, measuring up to 5 mm in maximum diameter each. No gallbladder wall thickening or pericholecystic fluid. Somewhat small liver with the exception of mildly prominent lateral segment left lobe and caudate lobe. The liver has mildly lobulated contours. Pancreas: Unremarkable. No pancreatic ductal dilatation or surrounding inflammatory changes. Spleen: Mildly prominent without splenomegaly. Adrenals/Urinary Tract: Mild to moderate dilatation of both renal collecting systems and moderate dilatation of both ureters to the level of the urinary bladder.  Foley catheter in the bladder with associated air in the bladder. The bladder is distended, despite presence of the Foley catheter. No calculi seen. Normal appearing adrenal glands. Stomach/Bowel: Unremarkable stomach, small bowel and colon. No evidence of appendicitis. Vascular/Lymphatic: Atheromatous calcifications. Small focal infrarenal abdominal aortic aneurysm measuring 3.1 cm in maximum diameter on image number 42 series 2. No enlarged lymph nodes. Reproductive: Prostate is unremarkable. Other: Minimal free peritoneal fluid adjacent to the liver. Small to moderate-sized umbilical hernia containing fat. Musculoskeletal: Diffuse patchy sclerosis of the bones. IMPRESSION: 1. Mild to moderate bilateral hydronephrosis and moderate bilateral hydroureter to the level of a mildly distended urinary bladder, despite presence of a Foley catheter. No obstructing calculi are seen. 2. Diffuse patchy bone sclerosis with appearance compatible with metastatic prostate cancer. 3. Cholelithiasis without evidence of cholecystitis. 4. Findings suggesting cirrhosis of the liver with minimal ascites. 5. 3.1 cm infrarenal abdominal aortic aneurysm. Recommend followup by  ultrasound in 3 years. This recommendation follows ACR consensus guidelines: White Paper of the ACR Incidental Findings Committee II on Vascular Findings. Joellyn Rued Radiol 2013; [phone] Electronically Signed   By: Claudie Revering M.D.   On: 09/24/2018 19:10   Ct Head Wo Contrast  Result Date: 09/24/2018 CLINICAL DATA:  Altered mental status EXAM: CT HEAD WITHOUT CONTRAST TECHNIQUE: Contiguous axial images were obtained from the base of the skull through the vertex without intravenous contrast. COMPARISON:  None. FINDINGS: Brain: Diffusely enlarged ventricles and subarachnoid spaces. Patchy white matter low density in both cerebral hemispheres. No intracranial hemorrhage, mass lesion or CT evidence of acute infarction. Vascular: No hyperdense vessel or unexpected calcification. Skull: Normal. Negative for fracture or focal lesion. Sinuses/Orbits: Status post bilateral cataract extraction. Unremarkable paranasal sinuses. Other: None. IMPRESSION: 1. No acute abnormality. 2. Moderate diffuse cerebral and cerebellar atrophy and mild chronic small vessel white matter ischemic changes in both cerebral hemispheres. Electronically Signed   By: Claudie Revering M.D.   On: 09/24/2018 19:01   Dg Chest Port 1 View  Result Date: 09/24/2018 CLINICAL DATA:  62 year old male with sepsis. EXAM: PORTABLE CHEST 1 VIEW COMPARISON:  Chest radiographs 04/20/2016 and earlier. FINDINGS: Pacer or resuscitation pads projecting over the central chest. Somewhat large lung volumes. Mediastinal contours remain normal. Visualized tracheal air column is within normal limits. Allowing for portable technique the lungs are clear. No pneumothorax. Questionable heterogeneous bone mineralization throughout the visible chest and spine. This might be artifact due to technique. Paucity of bowel gas in the upper abdomen. IMPRESSION: 1.  No acute cardiopulmonary abnormality. 2. Questionable new generalized abnormal sclerosis in the visible ribs and spine.  This may be artifact but sclerotic metastatic disease to bone is difficult to exclude. Electronically Signed   By: Genevie Ann M.D.   On: 09/24/2018 16:31    ASSESSMENT: Sclerotic bony lesions suspicious for metastatic disease, severe anemia and thrombocytopenia.  PLAN:    1.  Sclerotic bony lesions: Highly suspicious for metastatic disease.  Prostate cancer is a possibility and PSA is pending at time of dictation.  CT scan of the abdomen pelvis did not reveal distinct primary, but CT scan of chest has been ordered to complete the metastatic work-up.  CT scan of the head did not reveal any abnormality or metastatic disease.  Given patient's current performance status and overall metabolic derangements, any treatment for malignancy would be difficult and not recommended at this time. 2.  Severe anemia: Likely multifactorial including nutritional.  Improved with transfusion.  Have ordered hemolysis labs,  B12, folate, and iron stores for completeness. 3.  Thrombocytopenia: Most likely secondary to patient's heavy alcohol use and cirrhosis.  Patient does not have splenomegaly.  Appreciate consult, will follow.  Lloyd Huger, MD   09/25/2018 7:27 PM

## 2018-09-25 NOTE — Care Management Note (Signed)
Case Management Note  Patient Details  Name: Joseph Trevino MRN: [id number] Date of Birth: [birth year]  Subjective/Objective:                 Patient has lived in the [facility] for approximately 3 months.  Has not pcp. Housekeeping staff initiated a wellness check due to foul odor outside patient's room at the motel. Patient is not able to speak with CM.  Home phone number for patient / spousehas been disconnected and the pone number for mobile is answered by "Dolphus Jenny" and this person does not have a friend or family member in the hospital. Patient was found with empty liquor bottles. Was hypothermic and hemoglobin 3.8 on admission. Urology, gi and nephrology consulting.    Action/Plan:  Will attempt assessment 10.2  Expected Discharge Date:                  Expected Discharge Plan:     In-House Referral:     Discharge planning Services     Post Acute Care Choice:    Choice offered to:     DME Arranged:    DME Agency:     HH Arranged:    HH Agency:     Status of Service:     If discussed at [employer] of Stay Meetings, dates discussed:    Additional Comments:  Katrina Stack, RN 09/25/2018, 10:25 AM

## 2018-09-25 NOTE — Progress Notes (Signed)
[place] at [facility]                                                                                                                                                                                  Patient Demographics   Reza Crymes, is a 62 y.o. male, DOB - 12/05/1956, WRU:[id number]  Admit date - 09/24/2018   Admitting Physician Epifanio Lesches, MD  Outpatient Primary MD for the patient is Patient, [name]   LOS - 1  Subjective: Patient currently confused unable to provide any review of systems    Review of Systems:   CONSTITUTIONAL: Unable to provide any review of system  Vitals:   Vitals:   09/25/18 1000 09/25/18 1100 09/25/18 1200 09/25/18 1300  BP: 98/68 97/77 106/69 102/65  Pulse: (!) 53 (!) 56 (!) 55 (!) 56  Resp: 17 16 17 14   Temp: 97.6 F (36.4 C)  97.6 F (36.4 C) 97.9 F (36.6 C)  TempSrc: Axillary  Axillary Axillary  SpO2: 100% 94% 99% 97%  Weight:      Height:        Wt Readings from Last 3 Encounters:  09/24/18 52.8 kg  08/03/16 71 kg  04/19/16 81.6 kg     Intake/Output Summary (Last 24 hours) at 09/25/2018 1340 Last data filed at 09/25/2018 1330 Gross per 24 hour  Intake 3373.95 ml  Output 6090 ml  Net -2716.05 ml    Physical Exam:   GENERAL: Chronically ill-appearing thin male HEAD, EYES, EARS, NOSE AND THROAT: Atraumatic, normocephalic. Extraocular muscles are intact. Pupils equal and reactive to light. Sclerae anicteric. No conjunctival injection. No oro-pharyngeal erythema.  NECK: Supple. There is no jugular venous distention. No bruits, no lymphadenopathy, no thyromegaly.  HEART: Regular rate and rhythm,. No murmurs, no rubs, no clicks.  LUNGS: Clear to auscultation bilaterally. No rales or rhonchi. No wheezes.  ABDOMEN: Soft, flat, nontender, nondistended. Has good bowel sounds. No hepatosplenomegaly appreciated.  EXTREMITIES: No evidence of any cyanosis, clubbing, or peripheral edema.  +2 pedal and  radial pulses bilaterally.  NEUROLOGIC: Awake but not oriented SKIN: Moist and warm with no rashes appreciated.  Psych: Not anxious, depressed LN: No inguinal LN enlargement    Antibiotics   Anti-infectives (From admission, onward)   Start     Dose/Rate Route Frequency Ordered Stop   09/25/18 1800  cefTRIAXone (ROCEPHIN) 2 g in sodium chloride 0.9 % 100 mL IVPB     2 g 200 mL/hr over 30 Minutes Intravenous Daily-1800 09/25/18 0956     09/25/18 0830  vancomycin (VANCOCIN) IVPB 1000 mg/200 mL premix  Status:  Discontinued     1,000 mg 200  mL/hr over 60 Minutes Intravenous every 72 hours 09/24/18 1938 09/25/18 0956   09/25/18 0000  ceFEPIme (MAXIPIME) 1 g in sodium chloride 0.9 % 100 mL IVPB  Status:  Discontinued     1 g 200 mL/hr over 30 Minutes Intravenous Every 24 hours 09/24/18 1731 09/24/18 2018   09/24/18 2100  cefTRIAXone (ROCEPHIN) 2 g in sodium chloride 0.9 % 100 mL IVPB  Status:  Discontinued     2 g 200 mL/hr over 30 Minutes Intravenous Every 24 hours 09/24/18 2018 09/25/18 0956   09/24/18 1936  vancomycin variable dose per unstable renal function (pharmacist dosing)  Status:  Discontinued      Does not apply See admin instructions 09/24/18 1938 09/25/18 1037   09/24/18 1600  ceFEPIme (MAXIPIME) 2 g in sodium chloride 0.9 % 100 mL IVPB     2 g 200 mL/hr over 30 Minutes Intravenous  Once 09/24/18 1556 09/24/18 1735   09/24/18 1600  metroNIDAZOLE (FLAGYL) IVPB 500 mg  Status:  Discontinued     500 mg 100 mL/hr over 60 Minutes Intravenous Every 8 hours 09/24/18 1556 09/24/18 2018   09/24/18 1600  vancomycin (VANCOCIN) IVPB 1000 mg/200 mL premix     1,000 mg 200 mL/hr over 60 Minutes Intravenous  Once 09/24/18 1556 09/24/18 1904      Medications   Scheduled Meds: . sodium chloride   Intravenous Once  . sodium chloride   Intravenous Once  . sodium chloride   Intravenous Once  . docusate sodium  100 mg Oral BID  . folic acid  1 mg Intravenous Daily  . insulin aspart   0-9 Units Subcutaneous Q4H  . lidocaine  1 application Urethral Once  . lidocaine  1 application Urethral STAT  . [START ON 09/28/2018] pantoprazole  40 mg Intravenous Q12H  . sodium chloride flush  10-40 mL Intracatheter Q12H  . thiamine injection  100 mg Intravenous Daily   Continuous Infusions: . cefTRIAXone (ROCEPHIN)  IV    . dexmedetomidine (PRECEDEX) IV infusion Stopped (09/25/18 1145)  . dextrose 100 mL/hr at 09/24/18 1904  . lactated ringers 2,000 mL (09/25/18 1318)  . norepinephrine (LEVOPHED) 16mg  / 256mL infusion 5 mcg/min (09/25/18 0858)  . octreotide  (SANDOSTATIN)    IV infusion 25 mcg/hr (09/24/18 2035)  . pantoprozole (PROTONIX) infusion 8 mg/hr (09/25/18 0810)   PRN Meds:.LORazepam, ondansetron **OR** ondansetron (ZOFRAN) IV, sodium chloride flush   Data Review:   Micro Results Recent Results (from the past 240 hour(s))  Blood Culture (routine x 2)     Status: None (Preliminary result)   Collection Time: 09/24/18  2:54 PM  Result Value Ref Range Status   Specimen Description BLOOD RIGHT AC  Final   Special Requests   Final    BOTTLES DRAWN AEROBIC AND ANAEROBIC Blood Culture results may not be optimal due to an excessive volume of blood received in culture bottles   Culture   Final    NO GROWTH < 24 HOURS Performed at [facility], [address] [place], [place] [address]    Report Status PENDING  Incomplete  MRSA PCR Screening     Status: None   Collection Time: 09/24/18  8:43 PM  Result Value Ref Range Status   MRSA by PCR NEGATIVE NEGATIVE Final    Comment:        The GeneXpert MRSA Assay (FDA approved for NASAL specimens only), is one component of a comprehensive MRSA colonization surveillance program. It is not intended to  diagnose MRSA infection nor to guide or monitor treatment for MRSA infections. Performed at [facility], [address], [place], [place] [address]     Radiology Reports Ct Abdomen Pelvis Wo  Contrast  Result Date: 09/24/2018 CLINICAL DATA:  Altered mental status. Found on the floor. EXAM: CT ABDOMEN AND PELVIS WITHOUT CONTRAST TECHNIQUE: Multidetector CT imaging of the abdomen and pelvis was performed following the standard protocol without IV contrast. COMPARISON:  None. FINDINGS: Lower chest: Clear lung bases. Hepatobiliary: Multiple small gallstones in the dependent portion of the gallbladder, measuring up to 5 mm in maximum diameter each. No gallbladder wall thickening or pericholecystic fluid. Somewhat small liver with the exception of mildly prominent lateral segment left lobe and caudate lobe. The liver has mildly lobulated contours. Pancreas: Unremarkable. No pancreatic ductal dilatation or surrounding inflammatory changes. Spleen: Mildly prominent without splenomegaly. Adrenals/Urinary Tract: Mild to moderate dilatation of both renal collecting systems and moderate dilatation of both ureters to the level of the urinary bladder. Foley catheter in the bladder with associated air in the bladder. The bladder is distended, despite presence of the Foley catheter. No calculi seen. Normal appearing adrenal glands. Stomach/Bowel: Unremarkable stomach, small bowel and colon. No evidence of appendicitis. Vascular/Lymphatic: Atheromatous calcifications. Small focal infrarenal abdominal aortic aneurysm measuring 3.1 cm in maximum diameter on image number 42 series 2. No enlarged lymph nodes. Reproductive: Prostate is unremarkable. Other: Minimal free peritoneal fluid adjacent to the liver. Small to moderate-sized umbilical hernia containing fat. Musculoskeletal: Diffuse patchy sclerosis of the bones. IMPRESSION: 1. Mild to moderate bilateral hydronephrosis and moderate bilateral hydroureter to the level of a mildly distended urinary bladder, despite presence of a Foley catheter. No obstructing calculi are seen. 2. Diffuse patchy bone sclerosis with appearance compatible with metastatic prostate cancer. 3.  Cholelithiasis without evidence of cholecystitis. 4. Findings suggesting cirrhosis of the liver with minimal ascites. 5. 3.1 cm infrarenal abdominal aortic aneurysm. Recommend followup by ultrasound in 3 years. This recommendation follows ACR consensus guidelines: White Paper of the ACR Incidental Findings Committee II on Vascular Findings. Joellyn Rued Radiol 2013; [phone] Electronically Signed   By: Claudie Revering M.D.   On: 09/24/2018 19:10   Ct Head Wo Contrast  Result Date: 09/24/2018 CLINICAL DATA:  Altered mental status EXAM: CT HEAD WITHOUT CONTRAST TECHNIQUE: Contiguous axial images were obtained from the base of the skull through the vertex without intravenous contrast. COMPARISON:  None. FINDINGS: Brain: Diffusely enlarged ventricles and subarachnoid spaces. Patchy white matter low density in both cerebral hemispheres. No intracranial hemorrhage, mass lesion or CT evidence of acute infarction. Vascular: No hyperdense vessel or unexpected calcification. Skull: Normal. Negative for fracture or focal lesion. Sinuses/Orbits: Status post bilateral cataract extraction. Unremarkable paranasal sinuses. Other: None. IMPRESSION: 1. No acute abnormality. 2. Moderate diffuse cerebral and cerebellar atrophy and mild chronic small vessel white matter ischemic changes in both cerebral hemispheres. Electronically Signed   By: Claudie Revering M.D.   On: 09/24/2018 19:01   Dg Chest Port 1 View  Result Date: 09/24/2018 CLINICAL DATA:  62 year old male with sepsis. EXAM: PORTABLE CHEST 1 VIEW COMPARISON:  Chest radiographs 04/20/2016 and earlier. FINDINGS: Pacer or resuscitation pads projecting over the central chest. Somewhat large lung volumes. Mediastinal contours remain normal. Visualized tracheal air column is within normal limits. Allowing for portable technique the lungs are clear. No pneumothorax. Questionable heterogeneous bone mineralization throughout the visible chest and spine. This might be artifact due to  technique. Paucity of bowel gas in the  upper abdomen. IMPRESSION: 1.  No acute cardiopulmonary abnormality. 2. Questionable new generalized abnormal sclerosis in the visible ribs and spine. This may be artifact but sclerotic metastatic disease to bone is difficult to exclude. Electronically Signed   By: Genevie Ann M.D.   On: 09/24/2018 16:31     CBC Recent Labs  Lab 09/24/18 1454 09/24/18 1931 09/25/18 0053 09/25/18 0442 09/25/18 0952  WBC 9.9 5.6  --  8.2  --   HGB 3.8* 7.1* 5.1* 6.8* 7.9*  HCT 12.7* 21.7* 15.1* 19.9* 22.7*  PLT 68* 38*  --  81*  --   MCV 89.9 83.5  --  81.7  --   MCH 27.2 27.2  --  28.1  --   MCHC 30.2* 32.6  --  34.4  --   RDW 20.5* 17.2*  --  17.6*  --   LYMPHSABS 2.0 1.0  --   --   --   MONOABS 0.2 0.1*  --   --   --   EOSABS 0.0 0.0  --   --   --   BASOSABS 0.0 0.0  --   --   --     Chemistries  Recent Labs  Lab 09/24/18 1454 09/24/18 1931 09/24/18 2245 09/25/18 0442 09/25/18 0952  NA 164* 161* 161* 157* 155*  K 4.6 4.0 3.5 3.7 3.8  CL 127* >130* >130* 129* 126*  CO2 14* 17* 19* 18* 18*  GLUCOSE 181* 179* 143* 197* 133*  BUN 171* 155* 138* 137* 134*  CREATININE 8.04* 6.66* 5.68* 5.60* 5.32*  CALCIUM 8.6* 8.4* 7.6* 7.9* 7.9*  AST 48* 63*  --   --   --   ALT 11 17  --   --   --   ALKPHOS 179* 155*  --   --   --   BILITOT 1.9* 1.8*  --   --   --    ------------------------------------------------------------------------------------------------------------------ estimated creatinine clearance is 10.9 mL/min (A) (by C-G formula based on SCr of 5.32 mg/dL (H)). ------------------------------------------------------------------------------------------------------------------ No results for input(s): HGBA1C in the last 72 hours. ------------------------------------------------------------------------------------------------------------------ No results for input(s): CHOL, HDL, LDLCALC, TRIG, CHOLHDL, LDLDIRECT in the last 72  hours. ------------------------------------------------------------------------------------------------------------------ No results for input(s): TSH, T4TOTAL, T3FREE, THYROIDAB in the last 72 hours.  Invalid input(s): FREET3 ------------------------------------------------------------------------------------------------------------------ No results for input(s): VITAMINB12, FOLATE, FERRITIN, TIBC, IRON, RETICCTPCT in the last 72 hours.  Coagulation profile Recent Labs  Lab 09/24/18 1931  INR 1.98    No results for input(s): DDIMER in the last 72 hours.  Cardiac Enzymes Recent Labs  Lab 09/24/18 1454  TROPONINI 0.05*   ------------------------------------------------------------------------------------------------------------------ Invalid input(s): POCBNP    Assessment & Plan  Patient 62 year old admitted with severe anemia acute renal failure hypothermia  1.  Severe anemia could be due to possible GI bleed, due severe vitamin deficiencies Patient will need further work-up once stable from ICU standpoint GI will be seeing the patient   2.severe hypernatremia,due to severe dehydration; Continue IV fluids patient's sodium is improved  3.  Acute renal failure likely prerenal and postrenal;  improved with IV hydration  4 hypothermia suspect due to #1 and #2 Supportive care  5.  Atrial fibrillation: Unknown duration: Likely secondary to severe anemia, renal failure, hyponatremia check echocardiogram, to use any anticoagulation because of severe anemia  6.  Alcohol abuse with CIWA protocol      Code Status Orders  (From admission, onward)         Start     Ordered  09/24/18 1726  Full code  Continuous     09/24/18 1730        Code Status History    This patient has a current code status but no historical code status.           Consults  Intesivist, nephrology  DVT Prophylaxis  scd's  Lab Results  Component Value Date   PLT 81 (L) 09/25/2018      Time Spent in minutes 62min  Greater than 50% of time spent in care coordination and counseling patient regarding the condition and plan of care.   Dustin Flock M.D on 09/25/2018 at 1:40 PM  Between 7am to 6pm - Pager - [phone]  After 6pm go to [email or web address] - Proofreader  [employer]   Office  [phone]

## 2018-09-25 NOTE — Consult Note (Signed)
Patient seen and examined.  24 [place] three-way hematuria catheter placed over a wire without difficulty with return of 900 mL of moderate bloody urine without clot.  Full consult to follow

## 2018-09-25 NOTE — Consult Note (Addendum)
Joseph Antigua, MD [address] [place], [place], [place], [address] [address], [place], [place], [place], [address] Phone: [phone]  Fax: [phone]  Consultation  Referring Provider:     Dr. Posey Pronto Primary Care Physician:  Patient, [name] Reason for Consultation:     Anemia  Date of Admission:  09/24/2018 Date of Consultation:  09/25/2018         HPI:   Joseph Trevino is a 62 y.o. male with history of alcohol abuse presented after being found on the floor of his hotel room with empty liquor bottles around him.  GI consulted for evaluation of anemia.  No evidence of active GI bleeding prior to or since presentation.  No hematemesis, no blood reported to be seen in the patient's room before presentation, no bowel movements.  Patient had severe lab abnormalities on presentation including hyponatremia, core body temperature of 94.7, acute kidney injury, and admitted with septic shock due to UTI.  Also reported history of metastatic prostate cancer, and CT showing bilateral hydronephrosis on presentation.  Patient is not intubated, and is awake and alert.  No prior EGD or colonoscopy.  Past Medical History:  Diagnosis Date  . COPD (chronic obstructive pulmonary disease) ([place])   . Tobacco abuse     No past surgical history on file.  Prior to Admission medications   Medication Sig Start Date End Date Taking? Authorizing Provider  albuterol (PROVENTIL HFA;VENTOLIN HFA) 108 (90 Base) MCG/ACT inhaler Inhale 2 puffs into the lungs every 6 (six) hours as needed for wheezing or shortness of breath. 03/26/16   Demetrios Loll, MD  aspirin EC 81 MG tablet Take 1 tablet (81 mg total) by mouth daily. 03/26/16   Demetrios Loll, MD  famotidine (PEPCID) 20 MG tablet Take 1 tablet (20 mg total) by mouth 2 (two) times daily. 04/19/16   Earleen Newport, MD    Family History  Problem Relation Age of Onset  . Hypertension Neg Hx   . Diabetes Neg Hx      Social History   Tobacco Use   . Smoking status: Current Every Day Smoker    Packs/day: 1.00    Years: 35.00    Pack years: 35.00    Types: Cigarettes  Substance Use Topics  . Alcohol use: Yes    Alcohol/week: 0.0 standard drinks    Comment: 2-3 times per week  . Drug use: No    Allergies as of 09/24/2018  . (No Known Allergies)    Review of Systems:    All systems reviewed and negative except where noted in HPI.   Physical Exam:  Vital signs in last 24 hours: Vitals:   09/25/18 1100 09/25/18 1200 09/25/18 1300 09/25/18 1400  BP: 97/77 106/69 102/65 111/67  Pulse: (!) 56 (!) 55 (!) 56   Resp: 16 17 14 14   Temp:  97.6 F (36.4 C) 97.9 F (36.6 C)   TempSrc:  Axillary Axillary   SpO2: 94% 99% 97%   Weight:      Height:       Last BM Date: 09/24/18 General:   Pleasant, cooperative in NAD Head:  Normocephalic and atraumatic. Eyes:   No icterus.   Conjunctiva pink. PERRLA. Ears:  Normal auditory acuity. Neck:  Supple; no masses or thyroidomegaly Lungs: Respirations even and unlabored. Lungs clear to auscultation bilaterally.   No wheezes, crackles, or rhonchi.  Abdomen:  Soft, nondistended, nontender. Normal bowel sounds. No appreciable masses or hepatomegaly.  No rebound or guarding.  Neurologic:  Alert and oriented x3;  grossly normal neurologically. Skin:  Intact without significant lesions or rashes. Cervical Nodes:  No significant cervical adenopathy. Psych:  Alert and cooperative. Normal affect.  LAB RESULTS: Recent Labs    09/24/18 1454 09/24/18 1931 09/25/18 0053 09/25/18 0442 09/25/18 0952  WBC 9.9 5.6  --  8.2  --   HGB 3.8* 7.1* 5.1* 6.8* 7.9*  HCT 12.7* 21.7* 15.1* 19.9* 22.7*  PLT 68* 38*  --  81*  --    BMET Recent Labs    09/24/18 2245 09/25/18 0442 09/25/18 0952  NA 161* 157* 155*  K 3.5 3.7 3.8  CL >130* 129* 126*  CO2 19* 18* 18*  GLUCOSE 143* 197* 133*  BUN 138* 137* 134*  CREATININE 5.68* 5.60* 5.32*  CALCIUM 7.6* 7.9* 7.9*   LFT Recent Labs     09/24/18 1931  PROT 6.4*  ALBUMIN 2.4*  AST 63*  ALT 17  ALKPHOS 155*  BILITOT 1.8*   PT/INR Recent Labs    09/24/18 1931  LABPROT 22.3*  INR 1.98    STUDIES: Ct Abdomen Pelvis Wo Contrast  Result Date: 09/24/2018 CLINICAL DATA:  Altered mental status. Found on the floor. EXAM: CT ABDOMEN AND PELVIS WITHOUT CONTRAST TECHNIQUE: Multidetector CT imaging of the abdomen and pelvis was performed following the standard protocol without IV contrast. COMPARISON:  None. FINDINGS: Lower chest: Clear lung bases. Hepatobiliary: Multiple small gallstones in the dependent portion of the gallbladder, measuring up to 5 mm in maximum diameter each. No gallbladder wall thickening or pericholecystic fluid. Somewhat small liver with the exception of mildly prominent lateral segment left lobe and caudate lobe. The liver has mildly lobulated contours. Pancreas: Unremarkable. No pancreatic ductal dilatation or surrounding inflammatory changes. Spleen: Mildly prominent without splenomegaly. Adrenals/Urinary Tract: Mild to moderate dilatation of both renal collecting systems and moderate dilatation of both ureters to the level of the urinary bladder. Foley catheter in the bladder with associated air in the bladder. The bladder is distended, despite presence of the Foley catheter. No calculi seen. Normal appearing adrenal glands. Stomach/Bowel: Unremarkable stomach, small bowel and colon. No evidence of appendicitis. Vascular/Lymphatic: Atheromatous calcifications. Small focal infrarenal abdominal aortic aneurysm measuring 3.1 cm in maximum diameter on image number 42 series 2. No enlarged lymph nodes. Reproductive: Prostate is unremarkable. Other: Minimal free peritoneal fluid adjacent to the liver. Small to moderate-sized umbilical hernia containing fat. Musculoskeletal: Diffuse patchy sclerosis of the bones. IMPRESSION: 1. Mild to moderate bilateral hydronephrosis and moderate bilateral hydroureter to the level of a  mildly distended urinary bladder, despite presence of a Foley catheter. No obstructing calculi are seen. 2. Diffuse patchy bone sclerosis with appearance compatible with metastatic prostate cancer. 3. Cholelithiasis without evidence of cholecystitis. 4. Findings suggesting cirrhosis of the liver with minimal ascites. 5. 3.1 cm infrarenal abdominal aortic aneurysm. Recommend followup by ultrasound in 3 years. This recommendation follows ACR consensus guidelines: White Paper of the ACR Incidental Findings Committee II on Vascular Findings. Joellyn Rued Radiol 2013; [phone] Electronically Signed   By: Claudie Revering M.D.   On: 09/24/2018 19:10   Ct Head Wo Contrast  Result Date: 09/24/2018 CLINICAL DATA:  Altered mental status EXAM: CT HEAD WITHOUT CONTRAST TECHNIQUE: Contiguous axial images were obtained from the base of the skull through the vertex without intravenous contrast. COMPARISON:  None. FINDINGS: Brain: Diffusely enlarged ventricles and subarachnoid spaces. Patchy white matter low density in both cerebral hemispheres. No intracranial hemorrhage,  mass lesion or CT evidence of acute infarction. Vascular: No hyperdense vessel or unexpected calcification. Skull: Normal. Negative for fracture or focal lesion. Sinuses/Orbits: Status post bilateral cataract extraction. Unremarkable paranasal sinuses. Other: None. IMPRESSION: 1. No acute abnormality. 2. Moderate diffuse cerebral and cerebellar atrophy and mild chronic small vessel white matter ischemic changes in both cerebral hemispheres. Electronically Signed   By: Claudie Revering M.D.   On: 09/24/2018 19:01   Dg Chest Port 1 View  Result Date: 09/24/2018 CLINICAL DATA:  62 year old male with sepsis. EXAM: PORTABLE CHEST 1 VIEW COMPARISON:  Chest radiographs 04/20/2016 and earlier. FINDINGS: Pacer or resuscitation pads projecting over the central chest. Somewhat large lung volumes. Mediastinal contours remain normal. Visualized tracheal air column is within  normal limits. Allowing for portable technique the lungs are clear. No pneumothorax. Questionable heterogeneous bone mineralization throughout the visible chest and spine. This might be artifact due to technique. Paucity of bowel gas in the upper abdomen. IMPRESSION: 1.  No acute cardiopulmonary abnormality. 2. Questionable new generalized abnormal sclerosis in the visible ribs and spine. This may be artifact but sclerotic metastatic disease to bone is difficult to exclude. Electronically Signed   By: Genevie Ann M.D.   On: 09/24/2018 16:31      Impression / Plan:   Joseph Trevino is a 62 y.o. y/o male with sepsis from UTI, bilateral hydronephrosis, alcohol abuse, with GI consulted for anemia  No signs of active GI bleeding prior to or since presentation Patient had urology evaluation and they placed a catheter, with the report stating that 900 mL of moderate bloody urine output without clot noted.  Patient's acute anemia without any signs of active GI bleeding is likely due to hematuria.  EGD was planned for today to rule out any upper GI lesions that could be causing his anemia.  However, patient needed urology procedure today, and needed frequent irrigation of the bladder, therefore patient could not be sent to the Endo unit for his procedure.  Given other acute medical issues including hydronephrosis, requirement of urology procedure as noted above, electrolyte abnormalities, and no signs of active GI bleeding, medical optimization prior to any endoscopic procedures would be the safest route in the setting.  Hemoglobin has improved since presentation after PRBC transfusion Continue serial CBCs and transfuse PRN Continue PPI IV twice daily Continue octreotide drip Continue ceftriaxone  Can reevaluate for need for endoscopy on a daily basis, and proceed with endoscopy when patient is medically optimized with improvement in sodium levels, improvement of kidney dysfunction etc.  However, if  hemoglobin improves, and no signs of GI bleeding are present, source of anemia can likely be attributed to his hematuria and procedures can be done at a later time when acute medical issues have resolved  Low platelets are consistent with likely underlying cirrhosis, along with imaging findings consistent with the same. Imaging shows minimal ascites, patient on ceftriaxone due to GI bleed and to prevent SBP   Due to his history of alcohol abuse CIWA protocol is recommended Folate, thiamine as well Encourage alcohol abstinence  INR elevated to 1.9 due to underlying cirrhosis Liver enzymes mildly elevated yesterday due to underlying cirrhosis, please repeat with next labs Avoid hepatotoxic drugs If liver enzymes worsen, can order further work-up to rule out viral and autoimmune hepatitis.  Currently they are consistent with underlying cirrhosis and not with acute hepatitis  Please page GI with any signs of active GI bleeding  Thank you for involving me in the  care of this patient.      LOS: 1 day   Virgel Manifold, MD  09/25/2018, 2:42 PM

## 2018-09-25 NOTE — Consult Note (Signed)
[facility] CONSULT NOTE    Date: 09/25/2018                  Patient Name:  Joseph Trevino  MRN: [id number]  DOB: [birth year]  Age / Sex: 62 y.o., male         PCP: Patient, [name]                 Service Requesting Consult: Critical care                 Reason for Consult: Acute renal failure, hypernatremia            History of Present Illness: Patient is a 62 y.o. male with a PMHx of COPD, tobacco abuse, who was admitted to [facility] on 09/24/2018 for evaluation of altered mental status.  It appears that the patient has been residing at a local hotel for approximately 3 months.  Staff at the hotel found him yesterday with empty bottles of liquor.  Patient was found to be pulseless and he underwent pericardial thump.  After being brought here he was found to have severe metabolic derangements.  He was found to have severe anemia, acute renal failure, hypothermia, and lactic acidosis.  Early this a.m. his urine output dropped.  He was found to have a significant amount of fluid within the bladder.  Urology is currently attempting placement of a catheter.  Urine output yesterday was 2.8 L.  BUN still quite high at 137 with a creatinine of 5.6.  Serum sodium has improved a bit and sodium at the moment is 157.   Medications: Outpatient medications: Medications Prior to Admission  Medication Sig Dispense Refill Last Dose  . albuterol (PROVENTIL HFA;VENTOLIN HFA) 108 (90 Base) MCG/ACT inhaler Inhale 2 puffs into the lungs every 6 (six) hours as needed for wheezing or shortness of breath. 1 Inhaler 2 unknown at unknown  . aspirin EC 81 MG tablet Take 1 tablet (81 mg total) by mouth daily. 30 tablet 2 unknown at unknown  . famotidine (PEPCID) 20 MG tablet Take 1 tablet (20 mg total) by mouth 2 (two) times daily. 60 tablet 1 unknown at unknown    Current medications: Current Facility-Administered Medications  Medication Dose Route Frequency Provider Last Rate Last Dose   . 0.9 %  sodium chloride infusion (Manually program via Guardrails IV Fluids)   Intravenous Once Awilda Bill, NP      . 0.9 %  sodium chloride infusion (Manually program via Guardrails IV Fluids)   Intravenous Once Awilda Bill, NP      . 0.9 %  sodium chloride infusion (Manually program via Guardrails IV Fluids)   Intravenous Once Awilda Bill, NP      . cefTRIAXone (ROCEPHIN) 2 g in sodium chloride 0.9 % 100 mL IVPB  2 g Intravenous Q24H Awilda Bill, NP 200 mL/hr at 09/24/18 2100 2 g at 09/24/18 2100  . dexmedetomidine (PRECEDEX) 400 MCG/100ML (4 mcg/mL) infusion  0.4-1.2 mcg/kg/hr Intravenous Continuous Awilda Bill, NP 7.92 mL/hr at 09/25/18 0730 0.6 mcg/kg/hr at 09/25/18 0730  . dextrose 5 % solution   Intravenous Continuous Epifanio Lesches, MD 100 mL/hr at 09/24/18 1904    . docusate sodium (COLACE) capsule 100 mg  100 mg Oral BID Epifanio Lesches, MD      . folic acid injection 1 mg  1 mg Intravenous Daily Awilda Bill, NP   1 mg at 09/24/18 2259  .  insulin aspart (novoLOG) injection 0-9 Units  0-9 Units Subcutaneous Q4H Awilda Bill, NP   1 Units at 09/25/18 1517  . lidocaine (XYLOCAINE) 2 % jelly 1 application  1 application Urethral Once Awilda Bill, NP      . lidocaine (XYLOCAINE) 2 % jelly 1 application  1 application Urethral STAT Awilda Bill, NP      . norepinephrine (LEVOPHED) 16 mg in dextrose 5 % 250 mL (0.064 mg/mL) infusion  0-40 mcg/min Intravenous Titrated Awilda Bill, NP 2.34 mL/hr at 09/25/18 0842 2.5 mcg/min at 09/25/18 0842  . octreotide (SANDOSTATIN) 500 mcg in sodium chloride 0.9 % 250 mL (2 mcg/mL) infusion  25-50 mcg/hr Intravenous Continuous Epifanio Lesches, MD 12.5 mL/hr at 09/24/18 2035 25 mcg/hr at 09/24/18 2035  . ondansetron (ZOFRAN) tablet 4 mg  4 mg Oral Q6H PRN Epifanio Lesches, MD       Or  . ondansetron (ZOFRAN) injection 4 mg  4 mg Intravenous Q6H PRN Epifanio Lesches, MD      . pantoprazole  (PROTONIX) 80 mg in sodium chloride 0.9 % 250 mL (0.32 mg/mL) infusion  8 mg/hr Intravenous Continuous Epifanio Lesches, MD 25 mL/hr at 09/25/18 0810 8 mg/hr at 09/25/18 0810  . [START ON 09/28/2018] pantoprazole (PROTONIX) injection 40 mg  40 mg Intravenous Q12H Epifanio Lesches, MD      . sodium chloride flush (NS) 0.9 % injection 10-40 mL  10-40 mL Intracatheter Q12H Epifanio Lesches, MD      . sodium chloride flush (NS) 0.9 % injection 10-40 mL  10-40 mL Intracatheter PRN Epifanio Lesches, MD      . thiamine (B-1) injection 100 mg  100 mg Intravenous Daily Awilda Bill, NP      . vancomycin (VANCOCIN) IVPB 1000 mg/200 mL premix  1,000 mg Intravenous Q72H Shari Prows, RPH      . vancomycin variable dose per unstable renal function (pharmacist dosing)   Does not apply See admin instructions Shari Prows, RPH          Allergies: No Known Allergies    Past Medical History: Past Medical History:  Diagnosis Date  . COPD (chronic obstructive pulmonary disease) ([place])   . Tobacco abuse      Past Surgical History: No past surgical history on file.   Family History: Family History  Problem Relation Age of Onset  . Hypertension Neg Hx   . Diabetes Neg Hx      Social History: Social History   Socioeconomic History  . Marital status: Married    Spouse name: Not on file  . Number of children: Not on file  . Years of education: Not on file  . Highest education level: Not on file  Occupational History  . Not on file  Social Needs  . Financial resource strain: Not on file  . Food insecurity:    Worry: Not on file    Inability: Not on file  . Transportation needs:    Medical: Not on file    Non-medical: Not on file  Tobacco Use  . Smoking status: Current Every Day Smoker    Packs/day: 1.00    Years: 35.00    Pack years: 35.00    Types: Cigarettes  Substance and Sexual Activity  . Alcohol use: Yes    Alcohol/week: 0.0 standard  drinks    Comment: 2-3 times per week  . Drug use: No  . Sexual activity: Not on file  Lifestyle  . Physical  activity:    Days per week: Not on file    Minutes per session: Not on file  . Stress: Not on file  Relationships  . Social connections:    Talks on phone: Not on file    Gets together: Not on file    Attends religious service: Not on file    Active member of club or organization: Not on file    Attends meetings of clubs or organizations: Not on file    Relationship status: Not on file  . Intimate partner violence:    Fear of current or ex partner: Not on file    Emotionally abused: Not on file    Physically abused: Not on file    Forced sexual activity: Not on file  Other Topics Concern  . Not on file  Social History Narrative  . Not on file     Review of Systems: Unable to provide due to AMS  Vital Signs: Blood pressure 97/68, pulse (!) 59, temperature (!) 96.9 F (36.1 C), temperature source Axillary, resp. rate 18, height 5\' 7"  (1.702 m), weight 52.8 kg, SpO2 100 %.  Weight trends: Filed Weights   09/24/18 1457 09/24/18 1900  Weight: 71 kg 52.8 kg    Physical Exam: General: dissheveled critically ill appearing  Head: Normocephalic, atraumatic.  Eyes: Anicteric  Nose: Mucous membranes moist, not inflammed, nonerythematous.  Throat: Oropharynx nonerythematous, no exudate appreciated.   Neck: Supple, trachea midline.  Lungs:  Normal effort, CTAB  Heart: S1S2 irregular  Abdomen:  Lower abdominal distension  Extremities: No pretibial edema.  Neurologic: Awake but confused  Skin: Extensive bilateral UE ecchymoses    Lab results: Basic Metabolic Panel: Recent Labs  Lab 09/24/18 1931 09/24/18 2245 09/25/18 0442  NA 161* 161* 157*  K 4.0 3.5 3.7  CL >130* >130* 129*  CO2 17* 19* 18*  GLUCOSE 179* 143* 197*  BUN 155* 138* 137*  CREATININE 6.66* 5.68* 5.60*  CALCIUM 8.4* 7.6* 7.9*    Liver Function Tests: Recent Labs  Lab 09/24/18 1454  09/24/18 1931  AST 48* 63*  ALT 11 17  ALKPHOS 179* 155*  BILITOT 1.9* 1.8*  PROT 6.7 6.4*  ALBUMIN 2.5* 2.4*   No results for input(s): LIPASE, AMYLASE in the last 168 hours. Recent Labs  Lab 09/24/18 1931  AMMONIA 42*    CBC: Recent Labs  Lab 09/24/18 1454 09/24/18 1931 09/25/18 0053 09/25/18 0442  WBC 9.9 5.6  --  8.2  NEUTROABS 7.6* 4.5  --   --   HGB 3.8* 7.1* 5.1* 6.8*  HCT 12.7* 21.7* 15.1* 19.9*  MCV 89.9 83.5  --  81.7  PLT 68* 38*  --  81*    Cardiac Enzymes: Recent Labs  Lab 09/24/18 1454 09/25/18 0442  CKTOTAL 73 43*  TROPONINI 0.05*  --     BNP: Invalid input(s): POCBNP  CBG: Recent Labs  Lab 09/24/18 2306 09/25/18 0311 09/25/18 0748  GLUCAP 144* 161* 138*    Microbiology: Results for orders placed or performed during the hospital encounter of 09/24/18  Blood Culture (routine x 2)     Status: None (Preliminary result)   Collection Time: 09/24/18  2:54 PM  Result Value Ref Range Status   Specimen Description BLOOD RIGHT [facility]  Final   Special Requests   Final    BOTTLES DRAWN AEROBIC AND ANAEROBIC Blood Culture results may not be optimal due to an excessive volume of blood received in culture bottles   Culture  Final    NO GROWTH < 24 HOURS Performed at [facility], [address] [place], [place] [address]    Report Status PENDING  Incomplete  MRSA PCR Screening     Status: None   Collection Time: 09/24/18  8:43 PM  Result Value Ref Range Status   MRSA by PCR NEGATIVE NEGATIVE Final    Comment:        The GeneXpert MRSA Assay (FDA approved for NASAL specimens only), is one component of a comprehensive MRSA colonization surveillance program. It is not intended to diagnose MRSA infection nor to guide or monitor treatment for MRSA infections. Performed at [facility], [address] [place], [place] [address]     Coagulation Studies: Recent Labs    09/24/18 1931  LABPROT 22.3*  INR 1.98     Urinalysis: Recent Labs    09/24/18 1455  COLORURINE AMBER*  LABSPEC 1.012  PHURINE 5.0  GLUCOSEU NEGATIVE  HGBUR SMALL*  BILIRUBINUR NEGATIVE  KETONESUR NEGATIVE  PROTEINUR NEGATIVE  NITRITE NEGATIVE  LEUKOCYTESUR MODERATE*      Imaging: Ct Abdomen Pelvis Wo Contrast  Result Date: 09/24/2018 CLINICAL DATA:  Altered mental status. Found on the floor. EXAM: CT ABDOMEN AND PELVIS WITHOUT CONTRAST TECHNIQUE: Multidetector CT imaging of the abdomen and pelvis was performed following the standard protocol without IV contrast. COMPARISON:  None. FINDINGS: Lower chest: Clear lung bases. Hepatobiliary: Multiple small gallstones in the dependent portion of the gallbladder, measuring up to 5 mm in maximum diameter each. No gallbladder wall thickening or pericholecystic fluid. Somewhat small liver with the exception of mildly prominent lateral segment left lobe and caudate lobe. The liver has mildly lobulated contours. Pancreas: Unremarkable. No pancreatic ductal dilatation or surrounding inflammatory changes. Spleen: Mildly prominent without splenomegaly. Adrenals/Urinary Tract: Mild to moderate dilatation of both renal collecting systems and moderate dilatation of both ureters to the level of the urinary bladder. Foley catheter in the bladder with associated air in the bladder. The bladder is distended, despite presence of the Foley catheter. No calculi seen. Normal appearing adrenal glands. Stomach/Bowel: Unremarkable stomach, small bowel and colon. No evidence of appendicitis. Vascular/Lymphatic: Atheromatous calcifications. Small focal infrarenal abdominal aortic aneurysm measuring 3.1 cm in maximum diameter on image number 42 series 2. No enlarged lymph nodes. Reproductive: Prostate is unremarkable. Other: Minimal free peritoneal fluid adjacent to the liver. Small to moderate-sized umbilical hernia containing fat. Musculoskeletal: Diffuse patchy sclerosis of the bones. IMPRESSION: 1. Mild to  moderate bilateral hydronephrosis and moderate bilateral hydroureter to the level of a mildly distended urinary bladder, despite presence of a Foley catheter. No obstructing calculi are seen. 2. Diffuse patchy bone sclerosis with appearance compatible with metastatic prostate cancer. 3. Cholelithiasis without evidence of cholecystitis. 4. Findings suggesting cirrhosis of the liver with minimal ascites. 5. 3.1 cm infrarenal abdominal aortic aneurysm. Recommend followup by ultrasound in 3 years. This recommendation follows ACR consensus guidelines: White Paper of the ACR Incidental Findings Committee II on Vascular Findings. Joellyn Rued Radiol 2013; [phone] Electronically Signed   By: Claudie Revering M.D.   On: 09/24/2018 19:10   Ct Head Wo Contrast  Result Date: 09/24/2018 CLINICAL DATA:  Altered mental status EXAM: CT HEAD WITHOUT CONTRAST TECHNIQUE: Contiguous axial images were obtained from the base of the skull through the vertex without intravenous contrast. COMPARISON:  None. FINDINGS: Brain: Diffusely enlarged ventricles and subarachnoid spaces. Patchy white matter low density in both cerebral hemispheres. No intracranial hemorrhage, mass lesion or CT evidence of  acute infarction. Vascular: No hyperdense vessel or unexpected calcification. Skull: Normal. Negative for fracture or focal lesion. Sinuses/Orbits: Status post bilateral cataract extraction. Unremarkable paranasal sinuses. Other: None. IMPRESSION: 1. No acute abnormality. 2. Moderate diffuse cerebral and cerebellar atrophy and mild chronic small vessel white matter ischemic changes in both cerebral hemispheres. Electronically Signed   By: Claudie Revering M.D.   On: 09/24/2018 19:01   Dg Chest Port 1 View  Result Date: 09/24/2018 CLINICAL DATA:  62 year old male with sepsis. EXAM: PORTABLE CHEST 1 VIEW COMPARISON:  Chest radiographs 04/20/2016 and earlier. FINDINGS: Pacer or resuscitation pads projecting over the central chest. Somewhat large lung  volumes. Mediastinal contours remain normal. Visualized tracheal air column is within normal limits. Allowing for portable technique the lungs are clear. No pneumothorax. Questionable heterogeneous bone mineralization throughout the visible chest and spine. This might be artifact due to technique. Paucity of bowel gas in the upper abdomen. IMPRESSION: 1.  No acute cardiopulmonary abnormality. 2. Questionable new generalized abnormal sclerosis in the visible ribs and spine. This may be artifact but sclerotic metastatic disease to bone is difficult to exclude. Electronically Signed   By: Genevie Ann M.D.   On: 09/24/2018 16:31      Assessment & Plan: Pt is a 62 y.o. male with a PMHx of COPD, tobacco abuse, who was admitted to [facility] on 09/24/2018 for evaluation of altered mental status.  It appears that the patient has been residing at a local hotel for approximately 3 months and was abusing ETOH.  Found to have severe metabolic deranagements at admission.   1.  Acute renal failure.  2.  Hypernatremia. 3.  Urinary retention/bilateral hydronephrosis.  4.  Severe anemia.   5.  Metabolic/lactic acidosis.   Plan: Patient was found to be critically ill upon admission.  He had severe metabolic derangements.  His baseline creatinine was 0.59 but this was from two years ago.  Since yesterday there has been some improvement in renal function.  BUN down to 137 with a creatinine of 5.6.  Continue IV fluid hydration for now.  No urgent indication to initiate dialysis as there was good urine output yesterday.  Continue D5W given serum sodium of 157.  Work-up of severe anemia as per gastroenterology and critical care.  Urology is also been consulted for bilateral hydronephrosis and urinary retention.  Overall prognosis appears to be quite guarded.  We will continue to monitor the patient's progress along with the critical care providers.

## 2018-09-26 ENCOUNTER — Encounter
Admission: EM | Disposition: A | Discharge status: Hospice | Payer: Self-pay | Source: Home / Self Care | Attending: Internal Medicine

## 2018-09-26 ENCOUNTER — Other Ambulatory Visit: Payer: Self-pay

## 2018-09-26 DIAGNOSIS — K7030 Alcoholic cirrhosis of liver without ascites: Secondary | ICD-10-CM

## 2018-09-26 DIAGNOSIS — N3289 Other specified disorders of bladder: Secondary | ICD-10-CM

## 2018-09-26 LAB — PSA, TOTAL AND FREE
PSA, Free Pct: 37.6 %
PSA, Free: 24.5 ng/mL
Prostate Specific Ag, Serum: 65.1 ng/mL — ABNORMAL HIGH (ref 0.0–4.0)

## 2018-09-26 LAB — CBC WITH DIFFERENTIAL/PLATELET
BASOS ABS: 0.1 10*3/uL (ref 0–0.1)
BASOS PCT: 0 %
Basophils Absolute: 0 10*3/uL (ref 0–0.1)
Basophils Relative: 1 %
EOS ABS: 0 10*3/uL (ref 0–0.7)
EOS PCT: 1 %
Eosinophils Absolute: 0.1 10*3/uL (ref 0–0.7)
Eosinophils Relative: 1 %
HCT: 19.2 % — ABNORMAL LOW (ref 40.0–52.0)
HCT: 22 % — ABNORMAL LOW (ref 40.0–52.0)
HEMOGLOBIN: 6.7 g/dL — AB (ref 13.0–18.0)
HEMOGLOBIN: 7.6 g/dL — AB (ref 13.0–18.0)
LYMPHS ABS: 0.9 10*3/uL — AB (ref 1.0–3.6)
Lymphocytes Relative: 21 %
Lymphocytes Relative: 18 %
Lymphs Abs: 1.6 10*3/uL (ref 1.0–3.6)
MCH: 28.9 pg (ref 26.0–34.0)
MCH: 28.4 pg (ref 26.0–34.0)
MCHC: 34.8 g/dL (ref 32.0–36.0)
MCHC: 34.7 g/dL (ref 32.0–36.0)
MCV: 82.9 fL (ref 80.0–100.0)
MCV: 81.8 fL (ref 80.0–100.0)
MONO ABS: 0.1 10*3/uL — AB (ref 0.2–1.0)
Monocytes Absolute: 0.2 10*3/uL (ref 0.2–1.0)
Monocytes Relative: 3 %
Monocytes Relative: 2 %
NEUTROS PCT: 75 %
NEUTROS PCT: 78 %
Neutro Abs: 5.7 10*3/uL (ref 1.4–6.5)
Neutro Abs: 3.8 10*3/uL (ref 1.4–6.5)
PLATELETS: 37 10*3/uL — AB (ref 150–440)
Platelets: 68 10*3/uL — ABNORMAL LOW (ref 150–440)
RBC: 2.32 MIL/uL — AB (ref 4.40–5.90)
RBC: 2.69 MIL/uL — AB (ref 4.40–5.90)
RDW: 17.8 % — ABNORMAL HIGH (ref 11.5–14.5)
RDW: 18.2 % — ABNORMAL HIGH (ref 11.5–14.5)
WBC: 7.5 10*3/uL (ref 3.8–10.6)
WBC: 5 10*3/uL (ref 3.8–10.6)

## 2018-09-26 LAB — GLUCOSE, CAPILLARY
GLUCOSE-CAPILLARY: 119 mg/dL — AB (ref 70–99)
GLUCOSE-CAPILLARY: 145 mg/dL — AB (ref 70–99)
GLUCOSE-CAPILLARY: 150 mg/dL — AB (ref 70–99)
GLUCOSE-CAPILLARY: 138 mg/dL — AB (ref 70–99)
Glucose-Capillary: 108 mg/dL — ABNORMAL HIGH (ref 70–99)
Glucose-Capillary: 89 mg/dL (ref 70–99)
Glucose-Capillary: 95 mg/dL (ref 70–99)

## 2018-09-26 LAB — CBC
HCT: 20.7 % — ABNORMAL LOW (ref 40.0–52.0)
Hemoglobin: 7.2 g/dL — ABNORMAL LOW (ref 13.0–18.0)
MCH: 29 pg (ref 26.0–34.0)
MCHC: 34.5 g/dL (ref 32.0–36.0)
MCV: 84.1 fL (ref 80.0–100.0)
PLATELETS: 42 10*3/uL — AB (ref 150–440)
RBC: 2.46 MIL/uL — ABNORMAL LOW (ref 4.40–5.90)
RDW: 17.6 % — AB (ref 11.5–14.5)
WBC: 6.1 10*3/uL (ref 3.8–10.6)

## 2018-09-26 LAB — SODIUM
SODIUM: 153 mmol/L — AB (ref 135–145)
SODIUM: 147 mmol/L — AB (ref 135–145)
Sodium: 148 mmol/L — ABNORMAL HIGH (ref 135–145)
Sodium: 150 mmol/L — ABNORMAL HIGH (ref 135–145)
Sodium: 152 mmol/L — ABNORMAL HIGH (ref 135–145)
Sodium: 152 mmol/L — ABNORMAL HIGH (ref 135–145)
Sodium: 152 mmol/L — ABNORMAL HIGH (ref 135–145)

## 2018-09-26 LAB — PREPARE RBC (CROSSMATCH)

## 2018-09-26 LAB — PROCALCITONIN: PROCALCITONIN: 0.9 ng/mL

## 2018-09-26 LAB — URINE CULTURE: Culture: NO GROWTH

## 2018-09-26 LAB — BASIC METABOLIC PANEL
ANION GAP: 7 (ref 5–15)
Anion gap: 6 (ref 5–15)
BUN: 114 mg/dL — ABNORMAL HIGH (ref 8–23)
BUN: 100 mg/dL — ABNORMAL HIGH (ref 8–23)
CALCIUM: 7.6 mg/dL — AB (ref 8.9–10.3)
CO2: 20 mmol/L — ABNORMAL LOW (ref 22–32)
CO2: 20 mmol/L — ABNORMAL LOW (ref 22–32)
Calcium: 7.5 mg/dL — ABNORMAL LOW (ref 8.9–10.3)
Chloride: 125 mmol/L — ABNORMAL HIGH (ref 98–111)
Chloride: 125 mmol/L — ABNORMAL HIGH (ref 98–111)
Creatinine, Ser: 3.89 mg/dL — ABNORMAL HIGH (ref 0.61–1.24)
Creatinine, Ser: 3.72 mg/dL — ABNORMAL HIGH (ref 0.61–1.24)
GFR, EST AFRICAN AMERICAN: 18 mL/min — AB (ref >60)
GFR, EST AFRICAN AMERICAN: 19 mL/min — AB (ref >60)
GFR, EST NON AFRICAN AMERICAN: 15 mL/min — AB (ref >60)
GFR, EST NON AFRICAN AMERICAN: 16 mL/min — AB (ref >60)
Glucose, Bld: 167 mg/dL — ABNORMAL HIGH (ref 70–99)
Glucose, Bld: 144 mg/dL — ABNORMAL HIGH (ref 70–99)
POTASSIUM: 3.4 mmol/L — AB (ref 3.5–5.1)
Potassium: 3 mmol/L — ABNORMAL LOW (ref 3.5–5.1)
Sodium: 151 mmol/L — ABNORMAL HIGH (ref 135–145)
Sodium: 152 mmol/L — ABNORMAL HIGH (ref 135–145)

## 2018-09-26 LAB — APTT: APTT: 34 s (ref 24–36)

## 2018-09-26 LAB — IRON AND TIBC
IRON: 109 ug/dL (ref 45–182)
Saturation Ratios: 94 % — ABNORMAL HIGH (ref 17.9–39.5)
TIBC: 116 ug/dL — ABNORMAL LOW (ref 250–450)
UIBC: 7 ug/dL

## 2018-09-26 LAB — PROTIME-INR
INR: 1.47
Prothrombin Time: 17.7 seconds — ABNORMAL HIGH (ref 11.4–15.2)

## 2018-09-26 LAB — FOLATE: Folate: 71.7 ng/mL (ref >5.9)

## 2018-09-26 LAB — FERRITIN: Ferritin: 991 ng/mL — ABNORMAL HIGH (ref 24–336)

## 2018-09-26 LAB — HIV ANTIBODY (ROUTINE TESTING W REFLEX): HIV Screen 4th Generation wRfx: NONREACTIVE

## 2018-09-26 LAB — VITAMIN B12: VITAMIN B 12: 460 pg/mL (ref 180–914)

## 2018-09-26 LAB — POTASSIUM: Potassium: 3.6 mmol/L (ref 3.5–5.1)

## 2018-09-26 LAB — LACTATE DEHYDROGENASE: LDH: 126 U/L (ref 98–192)

## 2018-09-26 LAB — PHOSPHORUS: Phosphorus: 3.6 mg/dL (ref 2.5–4.6)

## 2018-09-26 LAB — MAGNESIUM: Magnesium: 1.8 mg/dL (ref 1.7–2.4)

## 2018-09-26 SURGERY — EGD (ESOPHAGOGASTRODUODENOSCOPY)
Anesthesia: General | Laterality: Left

## 2018-09-26 MED ORDER — BELLADONNA ALKALOIDS-OPIUM 16.2-60 MG RE SUPP
1.0000 | Freq: Three times a day (TID) | RECTAL | Status: DC | PRN
  Administered 2018-09-28 – 2018-09-29 (×2): 1 via RECTAL
  Filled 2018-09-26 (×2): qty 1

## 2018-09-26 MED ORDER — SODIUM CHLORIDE 0.9% IV SOLUTION
Freq: Once | INTRAVENOUS | Status: AC
  Administered 2018-09-26: 11:00:00 via INTRAVENOUS

## 2018-09-26 MED ORDER — SODIUM CHLORIDE 0.9% IV SOLUTION: Freq: Once | INTRAVENOUS | Status: DC

## 2018-09-26 MED ORDER — VITAMIN C 500 MG PO TABS
500.0000 mg | ORAL_TABLET | Freq: Two times a day (BID) | ORAL | Status: DC
  Administered 2018-09-26 – 2018-09-28 (×5): 500 mg via ORAL
  Filled 2018-09-26 (×8): qty 1

## 2018-09-26 MED ORDER — MORPHINE SULFATE (PF) 2 MG/ML IV SOLN
2.0000 mg | INTRAVENOUS | Status: DC | PRN
  Administered 2018-09-26 – 2018-09-30 (×5): 2 mg via INTRAVENOUS
  Filled 2018-09-26 (×4): qty 1

## 2018-09-26 MED ORDER — ADULT MULTIVITAMIN W/MINERALS CH
1.0000 | ORAL_TABLET | Freq: Every day | ORAL | Status: DC
  Administered 2018-09-26 – 2018-09-28 (×3): 1 via ORAL
  Filled 2018-09-26 (×3): qty 1

## 2018-09-26 MED ORDER — DEXTROSE 5 % IV SOLN
INTRAVENOUS | Status: DC
  Administered 2018-09-26 – 2018-09-28 (×8): via INTRAVENOUS

## 2018-09-26 MED ORDER — ENSURE ENLIVE PO LIQD
237.0000 mL | Freq: Two times a day (BID) | ORAL | Status: DC
  Administered 2018-09-27 – 2018-09-28 (×2): 237 mL via ORAL

## 2018-09-26 MED ORDER — POTASSIUM CHLORIDE 10 MEQ/100ML IV SOLN
10.0000 meq | INTRAVENOUS | Status: AC
  Administered 2018-09-26 (×5): 10 meq via INTRAVENOUS
  Filled 2018-09-26 (×5): qty 100

## 2018-09-26 MED ORDER — SODIUM CHLORIDE 0.9% IV SOLUTION
Freq: Once | INTRAVENOUS | Status: AC
  Administered 2018-09-26: 04:00:00 via INTRAVENOUS

## 2018-09-26 MED ORDER — DEXTROSE-NACL 5-0.45 % IV SOLN
INTRAVENOUS | Status: DC
  Administered 2018-09-26: via INTRAVENOUS

## 2018-09-26 NOTE — Progress Notes (Signed)
Called to see the patient tonight.  His Foley catheter which have been placed the previous evening by Dr. Bernardo Heater appears to have clotted off.  They were able to push and 60 cc but not aspirated back.  Additionally, the catheter looks elongated like is not in place "spaghetti sign".  After several attempts to manipulate, I asked the nursing staff to remove the Foley catheter.   From behind my arrival, I attempted replace a 30 French coud tip hematuria catheter but met resistance of the prostate.  Notably, the patient has suspected prostate cancer.  I tried irrigating the catheter at the level beyond which I could not pass the catheter did not irrigate easily suspicious for intraprostatic rather within the lumen of the bladder.  The balloon was not inflated.  I then attempted to blindly place a sensor wire which advanced quite easily at which time the patient complained that he felt spasms and pain within his bladder and felt like the wire was indeed within his bladder.  I advanced a 5 [place] open-ended ureteral catheter over this wire and removed the wire.  I was able to inject and easily and aspirate back which it appeared to be mildly bloody urine.  The wires were placed in the 5 [place] open-ended ureteral catheter was removed.  I then used this as a guidewire to attempt a second re-advancement of the same 24 [place] coud catheter.  This time was successful and is able to help the catheter.  There was return of bloody urine at this point time.  The balloon was filled with 30 cc and pulled back to the bladder neck.  I then irrigated with 250 cc of normal saline and got back a small amount of clot.  The patient was then resumed on fast drip CBI.  Discussed with nurse, she will hand irrigate again later this evening to try to clear additional clot and continue CBI throughout the night.  Hollice Espy, MD

## 2018-09-26 NOTE — Clinical Social Work Note (Signed)
CSW consulted once for homeless issues and again for possible hospice home placement. CSW will await Palliative Care's consult findings prior to proceeding. Shela Leff MSW,LCSW [phone]

## 2018-09-26 NOTE — Progress Notes (Signed)
Vonda Antigua, MD [address], [place], [place], [place], [address] [address], [place], [place], [place], [address] Phone: [phone]  Fax: [phone]   Subjective: Patient with gross hematuria in Foley catheter throughout the night.  No evidence of GI bleeding.   Objective: Exam: Vital signs in last 24 hours: Vitals:   09/26/18 0700 09/26/18 0800 09/26/18 0900 09/26/18 1000  BP: 133/65  94/63 96/66  Pulse: 79  88 87  Resp: (!) 22  18 13   Temp:  97.6 F (36.4 C)    TempSrc:  Axillary    SpO2: 90%  97% 95%  Weight:      Height:       Weight change: -11.8 kg  Intake/Output Summary (Last 24 hours) at 09/26/2018 1106 Last data filed at 09/26/2018 1030 Gross per 24 hour  Intake 87549.08 ml  Output 96665 ml  Net -9115.92 ml    General: No acute distress, AAO x3 Abd: Soft, NT/ND, No HSM Skin: Warm, no rashes Neck: Supple, Trachea midline   Lab Results: Lab Results  Component Value Date   WBC 7.5 09/26/2018   HGB 6.7 (L) 09/26/2018   HCT 19.2 (L) 09/26/2018   MCV 82.9 09/26/2018   PLT 68 (L) 09/26/2018   Micro Results: Recent Results (from the past 240 hour(s))  Blood Culture (routine x 2)     Status: None (Preliminary result)   Collection Time: 09/24/18  2:54 PM  Result Value Ref Range Status   Specimen Description BLOOD RIGHT AC  Final   Special Requests   Final    BOTTLES DRAWN AEROBIC AND ANAEROBIC Blood Culture results may not be optimal due to an excessive volume of blood received in culture bottles   Culture   Final    NO GROWTH 2 DAYS Performed at [facility], [address] [place], [place] [address]    Report Status PENDING  Incomplete  Urine culture     Status: None   Collection Time: 09/24/18  2:55 PM  Result Value Ref Range Status   Specimen Description   Final    URINE, RANDOM Performed at [facility], [address] [place], [place] [address]    Special Requests   Final    NONE Performed at  [facility], [address] [place], [place] [address]    Culture   Final    NO GROWTH Performed at [facility], [place] [address] [place], [place] [address]    Report Status 09/26/2018 FINAL  Final  MRSA PCR Screening     Status: None   Collection Time: 09/24/18  8:43 PM  Result Value Ref Range Status   MRSA by PCR NEGATIVE NEGATIVE Final    Comment:        The GeneXpert MRSA Assay (FDA approved for NASAL specimens only), is one component of a comprehensive MRSA colonization surveillance program. It is not intended to diagnose MRSA infection nor to guide or monitor treatment for MRSA infections. Performed at [facility], [address] [place], [place] [address]    Studies/Results: Ct Abdomen Pelvis Wo Contrast  Result Date: 09/24/2018 CLINICAL DATA:  Altered mental status. Found on the floor. EXAM: CT ABDOMEN AND PELVIS WITHOUT CONTRAST TECHNIQUE: Multidetector CT imaging of the abdomen and pelvis was performed following the standard protocol without IV contrast. COMPARISON:  None. FINDINGS: Lower chest: Clear lung bases. Hepatobiliary: Multiple small gallstones in the dependent portion of the gallbladder, measuring up to 5 mm  in maximum diameter each. No gallbladder wall thickening or pericholecystic fluid. Somewhat small liver with the exception of mildly prominent lateral segment left lobe and caudate lobe. The liver has mildly lobulated contours. Pancreas: Unremarkable. No pancreatic ductal dilatation or surrounding inflammatory changes. Spleen: Mildly prominent without splenomegaly. Adrenals/Urinary Tract: Mild to moderate dilatation of both renal collecting systems and moderate dilatation of both ureters to the level of the urinary bladder. Foley catheter in the bladder with associated air in the bladder. The bladder is distended, despite presence of the Foley catheter. No calculi seen. Normal appearing adrenal glands. Stomach/Bowel: Unremarkable  stomach, small bowel and colon. No evidence of appendicitis. Vascular/Lymphatic: Atheromatous calcifications. Small focal infrarenal abdominal aortic aneurysm measuring 3.1 cm in maximum diameter on image number 42 series 2. No enlarged lymph nodes. Reproductive: Prostate is unremarkable. Other: Minimal free peritoneal fluid adjacent to the liver. Small to moderate-sized umbilical hernia containing fat. Musculoskeletal: Diffuse patchy sclerosis of the bones. IMPRESSION: 1. Mild to moderate bilateral hydronephrosis and moderate bilateral hydroureter to the level of a mildly distended urinary bladder, despite presence of a Foley catheter. No obstructing calculi are seen. 2. Diffuse patchy bone sclerosis with appearance compatible with metastatic prostate cancer. 3. Cholelithiasis without evidence of cholecystitis. 4. Findings suggesting cirrhosis of the liver with minimal ascites. 5. 3.1 cm infrarenal abdominal aortic aneurysm. Recommend followup by ultrasound in 3 years. This recommendation follows ACR consensus guidelines: White Paper of the ACR Incidental Findings Committee II on Vascular Findings. Joellyn Rued Radiol 2013; [phone] Electronically Signed   By: Claudie Revering M.D.   On: 09/24/2018 19:10   Ct Head Wo Contrast  Result Date: 09/24/2018 CLINICAL DATA:  Altered mental status EXAM: CT HEAD WITHOUT CONTRAST TECHNIQUE: Contiguous axial images were obtained from the base of the skull through the vertex without intravenous contrast. COMPARISON:  None. FINDINGS: Brain: Diffusely enlarged ventricles and subarachnoid spaces. Patchy white matter low density in both cerebral hemispheres. No intracranial hemorrhage, mass lesion or CT evidence of acute infarction. Vascular: No hyperdense vessel or unexpected calcification. Skull: Normal. Negative for fracture or focal lesion. Sinuses/Orbits: Status post bilateral cataract extraction. Unremarkable paranasal sinuses. Other: None. IMPRESSION: 1. No acute abnormality.  2. Moderate diffuse cerebral and cerebellar atrophy and mild chronic small vessel white matter ischemic changes in both cerebral hemispheres. Electronically Signed   By: Claudie Revering M.D.   On: 09/24/2018 19:01   Ct Chest Wo Contrast  Result Date: 09/25/2018 CLINICAL DATA:  Altered mental status, anemia, acute renal failure, thrombocytopenia. Widespread sclerotic osseous lesions, suspicious for metastatic disease. EXAM: CT CHEST WITHOUT CONTRAST TECHNIQUE: Multidetector CT imaging of the chest was performed following the standard protocol without IV contrast. COMPARISON:  Chest radiographs dated 09/24/2018. CTA chest dated 03/25/2016. FINDINGS: Cardiovascular: The heart is normal in size. No pericardial effusion. No evidence of thoracic aortic aneurysm. Atherosclerotic calcifications aortic root/arch. Three vessel coronary atherosclerosis. Mediastinum/Nodes: No suspicious mediastinal lymphadenopathy. Visualized thyroid is unremarkable. Lungs/Pleura: Biapical pleural-parenchymal scarring. Small right pleural effusion, partially loculated. Small left pleural effusion. No focal consolidation. No pleural effusion or pneumothorax. Upper Abdomen: Unchanged from recent CT, noting mild perihepatic ascites. Musculoskeletal: Widespread/diffuse sclerotic osseous metastases throughout the visualized axial and appendicular skeleton, suggestive of metastatic prostate cancer. IMPRESSION: Widespread/diffuse sclerotic osseous metastases throughout the visualized axial and appendicular skeleton, suggestive of metastatic prostate cancer. Small bilateral pleural effusions, partially loculated on the right. Mild perihepatic ascites, incompletely visualized. Aortic Atherosclerosis (ICD10-I70.0). Electronically Signed   By: Julian Hy M.D.   On:  09/25/2018 23:59   Dg Chest Port 1 View  Result Date: 09/24/2018 CLINICAL DATA:  62 year old male with sepsis. EXAM: PORTABLE CHEST 1 VIEW COMPARISON:  Chest radiographs 04/20/2016  and earlier. FINDINGS: Pacer or resuscitation pads projecting over the central chest. Somewhat large lung volumes. Mediastinal contours remain normal. Visualized tracheal air column is within normal limits. Allowing for portable technique the lungs are clear. No pneumothorax. Questionable heterogeneous bone mineralization throughout the visible chest and spine. This might be artifact due to technique. Paucity of bowel gas in the upper abdomen. IMPRESSION: 1.  No acute cardiopulmonary abnormality. 2. Questionable new generalized abnormal sclerosis in the visible ribs and spine. This may be artifact but sclerotic metastatic disease to bone is difficult to exclude. Electronically Signed   By: Genevie Ann M.D.   On: 09/24/2018 16:31   Medications:  Scheduled Meds: . sodium chloride   Intravenous Once  . sodium chloride   Intravenous Once  . docusate sodium  100 mg Oral BID  . folic acid  1 mg Intravenous Daily  . insulin aspart  0-9 Units Subcutaneous Q4H  . lidocaine  1 application Urethral Once  . [START ON 09/28/2018] pantoprazole  40 mg Intravenous Q12H  . sodium chloride flush  10-40 mL Intracatheter Q12H  . thiamine injection  100 mg Intravenous Daily   Continuous Infusions: . cefTRIAXone (ROCEPHIN)  IV 2 g (09/25/18 1855)  . dexmedetomidine (PRECEDEX) IV infusion Stopped (09/25/18 1145)  . dextrose 125 mL/hr at 09/26/18 0733  . norepinephrine (LEVOPHED) 16mg  / 276mL infusion Stopped (09/26/18 0451)  . potassium chloride 10 mEq (09/26/18 1038)   PRN Meds:.LORazepam, morphine injection, ondansetron **OR** ondansetron (ZOFRAN) IV, opium-belladonna, sodium chloride flush   Assessment: Principal Problem:   Alcohol abuse Active Problems:   Acute anemia   COPD (chronic obstructive pulmonary disease) (HCC)    Plan: Patient's gross hematuria explains his anemia on presentation No signs of active GI bleeding Therefore, we will not proceed with endoscopy at this time as source of anemia is his  hematuria  In addition, endoscopy at this time would have higher risks than benefits given there is no evidence of active GI bleeding and given that there is another source of his anemia, and given his ongoing acute medical issues including renal failure, hypernatremia  Change PPI to 40 mg IV twice daily and then switch to p.o. in 1 to 2 days Octreotide has been discontinued by primary team  No clinical symptoms of variceal or upper GI bleeding prior to or since presentation Continues to CBCs and transfuse PRN  GI service will sign off Please page GI with any signs of active GI bleeding or any other questions   LOS: 2 days   Vonda Antigua, MD 09/26/2018, 11:06 AM

## 2018-09-26 NOTE — Progress Notes (Signed)
[facility]  [phone] [phone]  ID: Joseph Trevino OB: 02/06/1956  MR#: [id number]  NLG#:[id number]  Patient Care Team: Patient, [name] as PCP - General (General Practice)  CHIEF COMPLAINT: Widespread sclerotic bony lesions highly suspicious for metastatic prostate cancer.  Severe anemia secondary to bladder bleed.  Thrombocytopenia.  INTERVAL HISTORY: Patient appears to be resting comfortably and difficult to arouse.  No significant changes per nursing.  REVIEW OF SYSTEMS:   Review of Systems  Unable to perform ROS: Critical illness    PAST MEDICAL HISTORY: Past Medical History:  Diagnosis Date  . COPD (chronic obstructive pulmonary disease) ([place])   . Tobacco abuse     PAST SURGICAL HISTORY: History reviewed. No pertinent surgical history.  FAMILY HISTORY: Family History  Problem Relation Age of Onset  . Hypertension Neg Hx   . Diabetes Neg Hx     ADVANCED DIRECTIVES (Y/N):  @ADVDIR @  HEALTH MAINTENANCE: Social History   Tobacco Use  . Smoking status: Current Every Day Smoker    Packs/day: 1.00    Years: 35.00    Pack years: 35.00    Types: Cigarettes  . Smokeless tobacco: Never Used  Substance Use Topics  . Alcohol use: Yes    Alcohol/week: 0.0 standard drinks    Comment: 2-3 times per week  . Drug use: No     Colonoscopy:  PAP:  Bone density:  Lipid panel:  No Known Allergies  Current Facility-Administered Medications  Medication Dose Route Frequency Provider Last Rate Last Dose  . 0.9 %  sodium chloride infusion (Manually program via Guardrails IV Fluids)   Intravenous Once Awilda Bill, NP      . 0.9 %  sodium chloride infusion (Manually program via Guardrails IV Fluids)   Intravenous Once Awilda Bill, NP      . cefTRIAXone (ROCEPHIN) 2 g in sodium chloride 0.9 % 100 mL IVPB  2 g Intravenous q1800 Flora Lipps, MD 200 mL/hr at 09/25/18 1855 2 g at 09/25/18 1855  . dexmedetomidine  (PRECEDEX) 400 MCG/100ML (4 mcg/mL) infusion  0.4-1.2 mcg/kg/hr Intravenous Continuous Awilda Bill, NP   Stopped at 09/25/18 1145  . dextrose 5 % solution   Intravenous Continuous Holley Raring, [place], MD 125 mL/hr at 09/26/18 1354    . docusate sodium (COLACE) capsule 100 mg  100 mg Oral BID Epifanio Lesches, MD      . feeding supplement (ENSURE ENLIVE) (ENSURE ENLIVE) liquid 237 mL  237 mL Oral BID BM Kasa, Kurian, MD      . folic acid injection 1 mg  1 mg Intravenous Daily Awilda Bill, NP   1 mg at 09/26/18 0748  . insulin aspart (novoLOG) injection 0-9 Units  0-9 Units Subcutaneous Q4H Awilda Bill, NP   1 Units at 09/26/18 1141  . lidocaine (XYLOCAINE) 2 % jelly 1 application  1 application Urethral Once Awilda Bill, NP      . LORazepam (ATIVAN) injection 2-3 mg  2-3 mg Intravenous Q1H PRN Flora Lipps, MD      . morphine 2 MG/ML injection 2 mg  2 mg Intravenous Q2H PRN Flora Lipps, MD   2 mg at 09/26/18 1403  . multivitamin with minerals tablet 1 tablet  1 tablet Oral Daily Flora Lipps, MD   1 tablet at 09/26/18 1141  . norepinephrine (LEVOPHED) 16 mg in dextrose 5 % 250 mL (0.064 mg/mL) infusion  0-40 mcg/min Intravenous Titrated Awilda Bill, NP  Stopped at 09/26/18 0451  . ondansetron (ZOFRAN) tablet 4 mg  4 mg Oral Q6H PRN Epifanio Lesches, MD       Or  . ondansetron (ZOFRAN) injection 4 mg  4 mg Intravenous Q6H PRN Epifanio Lesches, MD      . opium-belladonna (B&O SUPPRETTES) 16.2-60 MG suppository 1 suppository  1 suppository Rectal Q8H PRN Stoioff, Ronda Fairly, MD      . Derrill Memo ON 09/28/2018] pantoprazole (PROTONIX) injection 40 mg  40 mg Intravenous Q12H Epifanio Lesches, MD      . potassium chloride 10 mEq in 100 mL IVPB  10 mEq Intravenous Q1 Hr x 5 Ellington, Abby K, RPH 100 mL/hr at 09/26/18 1356 10 mEq at 09/26/18 1356  . sodium chloride flush (NS) 0.9 % injection 10-40 mL  10-40 mL Intracatheter Q12H Epifanio Lesches, MD   10 mL at 09/26/18  0749  . sodium chloride flush (NS) 0.9 % injection 10-40 mL  10-40 mL Intracatheter PRN Epifanio Lesches, MD      . thiamine (B-1) injection 100 mg  100 mg Intravenous Daily Awilda Bill, NP   100 mg at 09/26/18 0748  . vitamin C (ASCORBIC ACID) tablet 500 mg  500 mg Oral BID Flora Lipps, MD   500 mg at 09/26/18 1141    OBJECTIVE: Vitals:   09/26/18 1345 09/26/18 1400  BP: (!) 87/66 103/78  Pulse: 75 84  Resp: 14 18  Temp:  97.6 F (36.4 C)  SpO2: 94% 97%     Body mass index is 20.44 kg/m.    ECOG FS:4 - Bedbound  General: Cachectic, no acute distress. Eyes: Pink conjunctiva, anicteric sclera. HEENT: Normocephalic, moist mucous membranes, clear oropharnyx. Lungs: Clear to auscultation bilaterally. Heart: Regular rate and rhythm. No rubs, murmurs, or gallops. Abdomen: Soft, nontender, nondistended. No organomegaly noted, normoactive bowel sounds. Musculoskeletal: No edema, cyanosis, or clubbing. Neuro: Lethargic.  Cranial nerves grossly intact. Skin: No rashes or petechiae noted.   LAB RESULTS:  Lab Results  Component Value Date   NA 150 (H) 09/26/2018   K 3.0 (L) 09/26/2018   CL 125 (H) 09/26/2018   CO2 20 (L) 09/26/2018   GLUCOSE 144 (H) 09/26/2018   BUN 100 (H) 09/26/2018   CREATININE 3.72 (H) 09/26/2018   CALCIUM 7.6 (L) 09/26/2018   PROT 6.4 (L) 09/24/2018   ALBUMIN 2.4 (L) 09/24/2018   AST 63 (H) 09/24/2018   ALT 17 09/24/2018   ALKPHOS 155 (H) 09/24/2018   BILITOT 1.8 (H) 09/24/2018   GFRNONAA 16 (L) 09/26/2018   GFRAA 19 (L) 09/26/2018    Lab Results  Component Value Date   WBC 7.5 09/26/2018   NEUTROABS 5.7 09/26/2018   HGB 6.7 (L) 09/26/2018   HCT 19.2 (L) 09/26/2018   MCV 82.9 09/26/2018   PLT 68 (L) 09/26/2018     STUDIES: Ct Abdomen Pelvis Wo Contrast  Result Date: 09/24/2018 CLINICAL DATA:  Altered mental status. Found on the floor. EXAM: CT ABDOMEN AND PELVIS WITHOUT CONTRAST TECHNIQUE: Multidetector CT imaging of the abdomen and  pelvis was performed following the standard protocol without IV contrast. COMPARISON:  None. FINDINGS: Lower chest: Clear lung bases. Hepatobiliary: Multiple small gallstones in the dependent portion of the gallbladder, measuring up to 5 mm in maximum diameter each. No gallbladder wall thickening or pericholecystic fluid. Somewhat small liver with the exception of mildly prominent lateral segment left lobe and caudate lobe. The liver has mildly lobulated contours. Pancreas: Unremarkable. No pancreatic ductal dilatation or surrounding  inflammatory changes. Spleen: Mildly prominent without splenomegaly. Adrenals/Urinary Tract: Mild to moderate dilatation of both renal collecting systems and moderate dilatation of both ureters to the level of the urinary bladder. Foley catheter in the bladder with associated air in the bladder. The bladder is distended, despite presence of the Foley catheter. No calculi seen. Normal appearing adrenal glands. Stomach/Bowel: Unremarkable stomach, small bowel and colon. No evidence of appendicitis. Vascular/Lymphatic: Atheromatous calcifications. Small focal infrarenal abdominal aortic aneurysm measuring 3.1 cm in maximum diameter on image number 42 series 2. No enlarged lymph nodes. Reproductive: Prostate is unremarkable. Other: Minimal free peritoneal fluid adjacent to the liver. Small to moderate-sized umbilical hernia containing fat. Musculoskeletal: Diffuse patchy sclerosis of the bones. IMPRESSION: 1. Mild to moderate bilateral hydronephrosis and moderate bilateral hydroureter to the level of a mildly distended urinary bladder, despite presence of a Foley catheter. No obstructing calculi are seen. 2. Diffuse patchy bone sclerosis with appearance compatible with metastatic prostate cancer. 3. Cholelithiasis without evidence of cholecystitis. 4. Findings suggesting cirrhosis of the liver with minimal ascites. 5. 3.1 cm infrarenal abdominal aortic aneurysm. Recommend followup by  ultrasound in 3 years. This recommendation follows ACR consensus guidelines: White Paper of the ACR Incidental Findings Committee II on Vascular Findings. Joellyn Rued Radiol 2013; [phone] Electronically Signed   By: Claudie Revering M.D.   On: 09/24/2018 19:10   Ct Head Wo Contrast  Result Date: 09/24/2018 CLINICAL DATA:  Altered mental status EXAM: CT HEAD WITHOUT CONTRAST TECHNIQUE: Contiguous axial images were obtained from the base of the skull through the vertex without intravenous contrast. COMPARISON:  None. FINDINGS: Brain: Diffusely enlarged ventricles and subarachnoid spaces. Patchy white matter low density in both cerebral hemispheres. No intracranial hemorrhage, mass lesion or CT evidence of acute infarction. Vascular: No hyperdense vessel or unexpected calcification. Skull: Normal. Negative for fracture or focal lesion. Sinuses/Orbits: Status post bilateral cataract extraction. Unremarkable paranasal sinuses. Other: None. IMPRESSION: 1. No acute abnormality. 2. Moderate diffuse cerebral and cerebellar atrophy and mild chronic small vessel white matter ischemic changes in both cerebral hemispheres. Electronically Signed   By: Claudie Revering M.D.   On: 09/24/2018 19:01   Ct Chest Wo Contrast  Result Date: 09/25/2018 CLINICAL DATA:  Altered mental status, anemia, acute renal failure, thrombocytopenia. Widespread sclerotic osseous lesions, suspicious for metastatic disease. EXAM: CT CHEST WITHOUT CONTRAST TECHNIQUE: Multidetector CT imaging of the chest was performed following the standard protocol without IV contrast. COMPARISON:  Chest radiographs dated 09/24/2018. CTA chest dated 03/25/2016. FINDINGS: Cardiovascular: The heart is normal in size. No pericardial effusion. No evidence of thoracic aortic aneurysm. Atherosclerotic calcifications aortic root/arch. Three vessel coronary atherosclerosis. Mediastinum/Nodes: No suspicious mediastinal lymphadenopathy. Visualized thyroid is unremarkable.  Lungs/Pleura: Biapical pleural-parenchymal scarring. Small right pleural effusion, partially loculated. Small left pleural effusion. No focal consolidation. No pleural effusion or pneumothorax. Upper Abdomen: Unchanged from recent CT, noting mild perihepatic ascites. Musculoskeletal: Widespread/diffuse sclerotic osseous metastases throughout the visualized axial and appendicular skeleton, suggestive of metastatic prostate cancer. IMPRESSION: Widespread/diffuse sclerotic osseous metastases throughout the visualized axial and appendicular skeleton, suggestive of metastatic prostate cancer. Small bilateral pleural effusions, partially loculated on the right. Mild perihepatic ascites, incompletely visualized. Aortic Atherosclerosis (ICD10-I70.0). Electronically Signed   By: Julian Hy M.D.   On: 09/25/2018 23:59   Dg Chest Port 1 View  Result Date: 09/24/2018 CLINICAL DATA:  62 year old male with sepsis. EXAM: PORTABLE CHEST 1 VIEW COMPARISON:  Chest radiographs 04/20/2016 and earlier. FINDINGS: Pacer or resuscitation pads projecting over the central  chest. Somewhat large lung volumes. Mediastinal contours remain normal. Visualized tracheal air column is within normal limits. Allowing for portable technique the lungs are clear. No pneumothorax. Questionable heterogeneous bone mineralization throughout the visible chest and spine. This might be artifact due to technique. Paucity of bowel gas in the upper abdomen. IMPRESSION: 1.  No acute cardiopulmonary abnormality. 2. Questionable new generalized abnormal sclerosis in the visible ribs and spine. This may be artifact but sclerotic metastatic disease to bone is difficult to exclude. Electronically Signed   By: Genevie Ann M.D.   On: 09/24/2018 16:31    ASSESSMENT: Widespread sclerotic bony lesions highly suspicious for metastatic prostate cancer.  Severe anemia secondary to bladder bleed.  Thrombocytopenia.  PLAN:    1.  Sclerotic bony lesions: Highly  suspicious for metastatic disease.  Given no other pathology seen on CT scans and PSA significantly elevated at 65.1, this is highly suspicious for widely metastatic prostate cancer.  Once patient's acute issues resolved, will consider biopsy for confirmation.   Despite stage IV disease, he would likely not tolerate systemic treatment and his compliance would be suspect if given oral treatment.  He may benefit from Lupron injection every 3 to 6 months.  This can be done as an outpatient after biopsy and patient's overall performance status improves.   2.  Severe anemia: Patient continues to have active bleeding from his bladder.  Repeat transfusion pending for today.  Folate, B12, and iron stores are within normal limits.  His ferritin is elevated likely secondary to acute phase reactant.  There is no evidence of hemolysis.  3.  Thrombocytopenia: Most likely secondary to patient's heavy alcohol use and cirrhosis.  Patient does not have splenomegaly.  Will follow.  Lloyd Huger, MD   09/26/2018 2:14 PM

## 2018-09-26 NOTE — Consult Note (Signed)
[facility] Consult   Reason for Consult: Follow-up for this 62 year old man with alcohol abuse who is in the intensive care unit. Referring Physician: Posey Pronto Patient Identification: Joseph Trevino MRN:  [id number] Principal Diagnosis: Alcohol abuse Diagnosis:   Patient Active Problem List   Diagnosis Date Noted  . COPD (chronic obstructive pulmonary disease) ([place]) [J44.9] 09/25/2018  . Alcohol abuse [F10.10] 09/25/2018  . Acute anemia [D64.9] 09/24/2018  . Shortness of breath [R06.02] 03/25/2016    Total Time spent with patient: 20 minutes  Subjective:   Joseph Trevino is a 62 y.o. male patient admitted with "what is the matter?".  HPI: Patient seen chart reviewed.  62 year old man in the intensive care unit.  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  Despite this the patient denies any pain.  Denies any symptoms at all.  He was able to guess that he was in the hospital but otherwise did not have much of an idea about what brought him into the hospital.  He asked me for something to drink and I directed him to talk to nursing about that.  Past Psychiatric History: No known past psychiatric history other than a history of documented alcohol abuse  Risk to Self:   Risk to Others:   Prior Inpatient Therapy:   Prior Outpatient Therapy:    Past Medical History:  Past Medical History:  Diagnosis Date  . COPD (chronic obstructive pulmonary disease) ([place])   . Tobacco abuse    History reviewed. No pertinent surgical history. Family History:  Family History  Problem Relation Age of Onset  . Hypertension Neg Hx   . Diabetes Neg Hx    Family Psychiatric  History: None known Social History:  Social History   Substance and Sexual Activity  Alcohol Use Yes  . Alcohol/week: 0.0 standard drinks   Comment: 2-3 times per week     Social History   Substance and Sexual Activity   Drug Use No    Social History   Socioeconomic History  . Marital status: Widowed    Spouse name: Not on file  . Number of children: Not on file  . Years of education: Not on file  . Highest education level: Not on file  Occupational History  . Not on file  Social Needs  . Financial resource strain: Not on file  . Food insecurity:    Worry: Not on file    Inability: Not on file  . Transportation needs:    Medical: Not on file    Non-medical: Not on file  Tobacco Use  . Smoking status: Current Every Day Smoker    Packs/day: 1.00    Years: 35.00    Pack years: 35.00    Types: Cigarettes  . Smokeless tobacco: Never Used  Substance and Sexual Activity  . Alcohol use: Yes    Alcohol/week: 0.0 standard drinks    Comment: 2-3 times per week  . Drug use: No  . Sexual activity: Not Currently  Lifestyle  . Physical activity:    Days per week: Not on file    Minutes per session: Not on file  . Stress: Not on file  Relationships  . Social connections:    Talks on phone: Not on file    Gets together: Not on file    Attends religious service: Not on file    Active member of club or organization: Not on  file    Attends meetings of clubs or organizations: Not on file    Relationship status: Not on file  Other Topics Concern  . Not on file  Social History Narrative  . Not on file   Additional Social History:    Allergies:  No Known Allergies  Labs:  Results for orders placed or performed during the hospital encounter of 09/24/18 (from the past 48 hour(s))  Lactic acid, plasma     Status: Abnormal   Collection Time: 09/24/18  7:31 PM  Result Value Ref Range   Lactic Acid, Venous 4.5 (HH) 0.5 - 1.9 mmol/L    Comment: CRITICAL RESULT CALLED TO, READ BACK BY AND VERIFIED WITH JOSH [facility] 09/24/18 2020 JML Performed at [facility], [address] [place], [place] [address]   Comprehensive metabolic panel     Status: Abnormal   Collection Time: 09/24/18   7:31 PM  Result Value Ref Range   Sodium 161 (HH) 135 - 145 mmol/L    Comment: CRITICAL RESULT CALLED TO, READ BACK BY AND VERIFIED WITH JOSH [facility] 09/24/18 2020 JML    Potassium 4.0 3.5 - 5.1 mmol/L   Chloride >130 (HH) 98 - 111 mmol/L    Comment: CRITICAL RESULT CALLED TO, READ BACK BY AND VERIFIED WITH JOSH [facility] 09/24/18 2020 JML    CO2 17 (L) 22 - 32 mmol/L   Glucose, Bld 179 (H) 70 - 99 mg/dL   BUN 155 (H) 8 - 23 mg/dL    Comment: RESULT CONFIRMED BY MANUAL DILUTION JML/SRC   Creatinine, Ser 6.66 (H) 0.61 - 1.24 mg/dL   Calcium 8.4 (L) 8.9 - 10.3 mg/dL   Total Protein 6.4 (L) 6.5 - 8.1 g/dL   Albumin 2.4 (L) 3.5 - 5.0 g/dL   AST 63 (H) 15 - 41 U/L   ALT 17 0 - 44 U/L   Alkaline Phosphatase 155 (H) 38 - 126 U/L   Total Bilirubin 1.8 (H) 0.3 - 1.2 mg/dL   GFR calc non Af Amer 8 (L) >60 mL/min   GFR calc Af Amer 9 (L) >60 mL/min    Comment: (NOTE) The eGFR has been calculated using the CKD EPI equation. This calculation has not been validated in all clinical situations. eGFR's persistently <60 mL/min signify possible Chronic Kidney Disease.    Anion gap UNABLE TO RESULT DUE TO NON NUMERIC VALUE [name] 5 - 15    Comment: Performed at [facility], [address] [place], [place] [address]  CBC with Differential/Platelet     Status: Abnormal   Collection Time: 09/24/18  7:31 PM  Result Value Ref Range   WBC 5.6 3.8 - 10.6 K/uL   RBC 2.59 (L) 4.40 - 5.90 MIL/uL   Hemoglobin 7.1 (L) 13.0 - 18.0 g/dL    Comment: RESULT REPEATED AND VERIFIED   HCT 21.7 (L) 40.0 - 52.0 %   MCV 83.5 80.0 - 100.0 fL    Comment: RESULT REPEATED AND VERIFIED   MCH 27.2 26.0 - 34.0 pg   MCHC 32.6 32.0 - 36.0 g/dL   RDW 17.2 (H) 11.5 - 14.5 %   Platelets 38 (L) 150 - 440 K/uL   Neutrophils Relative % 79 %   Neutro Abs 4.5 1.4 - 6.5 K/uL   Lymphocytes Relative 18 %   Lymphs Abs 1.0 1.0 - 3.6 K/uL   Monocytes Relative 2 %   Monocytes Absolute 0.1 (L) 0.2 - 1.0 K/uL    Eosinophils Relative 0 %  Eosinophils Absolute 0.0 0 - 0.7 K/uL   Basophils Relative 1 %   Basophils Absolute 0.0 0 - 0.1 K/uL    Comment: Performed at [facility], [address] [place], [place] [address]  Protime-INR     Status: Abnormal   Collection Time: 09/24/18  7:31 PM  Result Value Ref Range   Prothrombin Time 22.3 (H) 11.4 - 15.2 seconds   INR 1.98     Comment: Performed at [facility], [address] [place], [place] [address]  APTT     Status: Abnormal   Collection Time: 09/24/18  7:31 PM  Result Value Ref Range   aPTT 44 (H) 24 - 36 seconds    Comment:        IF BASELINE aPTT IS ELEVATED, SUGGEST PATIENT RISK ASSESSMENT BE USED TO DETERMINE APPROPRIATE ANTICOAGULANT THERAPY. Performed at [facility], [address] [place], [place] [address]   Ethanol     Status: None   Collection Time: 09/24/18  7:31 PM  Result Value Ref Range   Alcohol, Ethyl (B) <10 <10 mg/dL    Comment: (NOTE) Lowest detectable limit for serum alcohol is 10 mg/dL. For medical purposes only. Performed at [facility], [place], [place] [address]   Procalcitonin - Baseline     Status: None   Collection Time: 09/24/18  7:31 PM  Result Value Ref Range   Procalcitonin 1.11 ng/mL    Comment:        Interpretation: PCT > 0.5 ng/mL and <= 2 ng/mL: Systemic infection (sepsis) is possible, but other conditions are known to elevate PCT as well. (NOTE)       Sepsis PCT Algorithm           Lower Respiratory Tract                                      Infection PCT Algorithm    ----------------------------     ----------------------------         PCT < 0.25 ng/mL                PCT < 0.10 ng/mL         Strongly encourage             Strongly discourage   discontinuation of antibiotics    initiation of antibiotics    ----------------------------     -----------------------------       PCT 0.25 - 0.50 ng/mL            PCT 0.10 - 0.25  ng/mL               OR       >80% decrease in PCT            Discourage initiation of                                            antibiotics      Encourage discontinuation           of antibiotics    ----------------------------     -----------------------------         PCT >= 0.50 ng/mL              PCT 0.26 - 0.50 ng/mL  AND       <80% decrease in PCT             Encourage initiation of                                             antibiotics       Encourage continuation           of antibiotics    ----------------------------     -----------------------------        PCT >= 0.50 ng/mL                  PCT > 0.50 ng/mL               AND         increase in PCT                  Strongly encourage                                      initiation of antibiotics    Strongly encourage escalation           of antibiotics                                     -----------------------------                                           PCT <= 0.25 ng/mL                                                 OR                                        > 80% decrease in PCT                                     Discontinue / Do not initiate                                             antibiotics Performed at [facility], [address] [place], [place] [address]   Acetaminophen level     Status: Abnormal   Collection Time: 09/24/18  7:31 PM  Result Value Ref Range   Acetaminophen (Tylenol), Serum <10 (L) 10 - 30 ug/mL    Comment: (NOTE) Therapeutic concentrations vary significantly. A range of 10-30 ug/mL  may be an effective concentration for many patients. However, some  are best treated at concentrations outside of this range. Acetaminophen concentrations >150 ug/mL at 4 hours after ingestion  and >50 ug/mL at 12 hours after ingestion are often associated with  toxic reactions. Performed at [facility], [address] [place], [place] [address]   Salicylate  level     Status: None   Collection Time: 09/24/18  7:31 PM  Result Value Ref Range   Salicylate Lvl <1.2 2.8 - 30.0 mg/dL    Comment: Performed at [facility], [address] [place], [place] [address]  Ammonia     Status: Abnormal   Collection Time: 09/24/18  7:31 PM  Result Value Ref Range   Ammonia 42 (H) 9 - 35 umol/L    Comment: Performed at [facility], [address] [place], [place] [address]  Blood gas, arterial     Status: Abnormal   Collection Time: 09/24/18  7:40 PM  Result Value Ref Range   FIO2 0.21    Delivery systems ROOM AIR    pH, Arterial 7.45 7.350 - 7.450   pCO2 arterial 23 (L) 32.0 - 48.0 mmHg   pO2, Arterial 109 (H) 83.0 - 108.0 mmHg   Bicarbonate 16.0 (L) 20.0 - 28.0 mmol/L   Acid-base deficit 6.0 (H) 0.0 - 2.0 mmol/L   O2 Saturation 98.5 %   Patient temperature 37.0    Collection site LEFT RADIAL    Sample type ARTERIAL DRAW    Allens test (pass/fail) PASS PASS    Comment: Performed at [facility], [address], [place], [place] [address]  Prepare Pheresed Platelets     Status: None   Collection Time: 09/24/18  8:21 PM  Result Value Ref Range   Unit Number [id number]    Blood Component Type PLTPHER LR2    Unit division 00    Status of Unit ISSUED,FINAL    Transfusion Status      OK TO TRANSFUSE Performed at [facility], [address] [place], [place] [address]   MRSA PCR Screening     Status: None   Collection Time: 09/24/18  8:43 PM  Result Value Ref Range   MRSA by PCR NEGATIVE NEGATIVE    Comment:        The GeneXpert MRSA Assay (FDA approved for NASAL specimens only), is one component of a comprehensive MRSA colonization surveillance program. It is not intended to diagnose MRSA infection nor to guide or monitor treatment for MRSA infections. Performed at [facility], [address] [place], [place] [address]   Basic metabolic panel     Status: Abnormal   Collection  Time: 09/24/18 10:45 PM  Result Value Ref Range   Sodium 161 (HH) 135 - 145 mmol/L    Comment: CRITICAL RESULT CALLED TO, READ BACK BY AND VERIFIED WITH JOSH [facility] 09/24/18 2310 JML    Potassium 3.5 3.5 - 5.1 mmol/L   Chloride >130 (HH) 98 - 111 mmol/L    Comment: CRITICAL RESULT CALLED TO, READ BACK BY AND VERIFIED WITH JOSH WILLIAMSON 09/24/18 2310 JML    CO2 19 (L) 22 - 32 mmol/L   Glucose, Bld 143 (H) 70 - 99 mg/dL   BUN 138 (H) 8 - 23 mg/dL    Comment: RESULT CONFIRMED BY MANUAL DILUTION JML/SRC   Creatinine, Ser 5.68 (H) 0.61 - 1.24 mg/dL   Calcium 7.6 (L) 8.9 - 10.3 mg/dL   GFR calc non Af Amer 10 (L) >60 mL/min   GFR calc Af Amer 11 (L) >60 mL/min    Comment: (NOTE) The eGFR has been calculated using the CKD EPI equation. This calculation has not been validated in all clinical situations. eGFR's persistently <  60 mL/min signify possible Chronic Kidney Disease.    Anion gap UNABLE TO CALCULATE DUE TO NON NUMERIC VALUE 5 - 15    Comment: Performed at [facility], [address] [place], [place] [address]  Lactic acid, plasma     Status: Abnormal   Collection Time: 09/24/18 10:45 PM  Result Value Ref Range   Lactic Acid, Venous 2.8 (HH) 0.5 - 1.9 mmol/L    Comment: CRITICAL RESULT CALLED TO, READ BACK BY AND VERIFIED WITH JOSH WILLIAMSON ON 09/24/18 AT 2334 [facility] Performed at [facility], [address] [place], [place] [address]   Glucose, capillary     Status: Abnormal   Collection Time: 09/24/18 11:06 PM  Result Value Ref Range   Glucose-Capillary 144 (H) 70 - 99 mg/dL  HIV Antibody (routine testing w rflx)     Status: None   Collection Time: 09/25/18 12:53 AM  Result Value Ref Range   HIV Screen 4th Generation wRfx Non Reactive Non Reactive    Comment: (NOTE) Performed At: [facility] [address] [place], [place] [id number] Rush Farmer MD [id number]   Hemoglobin and hematocrit, blood     Status: Abnormal   Collection  Time: 09/25/18 12:53 AM  Result Value Ref Range   Hemoglobin 5.1 (L) 13.0 - 18.0 g/dL   HCT 15.1 (L) 40.0 - 52.0 %    Comment: Performed at [facility], [address] [place], [place] [address]  Prepare RBC     Status: None   Collection Time: 09/25/18  2:26 AM  Result Value Ref Range   Order Confirmation      ORDER PROCESSED BY BLOOD BANK Performed at [facility], [address], [place], [place] [address]   Prepare Pheresed Platelets     Status: None (Preliminary result)   Collection Time: 09/25/18  2:28 AM  Result Value Ref Range   Unit Number [id number]    Blood Component Type PLTP LR2 PAS    Unit division 00    Status of Unit ISSUED    Transfusion Status      OK TO TRANSFUSE Performed at [facility], [address] [place], [place] [address]   Glucose, capillary     Status: Abnormal   Collection Time: 09/25/18  3:11 AM  Result Value Ref Range   Glucose-Capillary 161 (H) 70 - 99 mg/dL  Basic metabolic panel     Status: Abnormal   Collection Time: 09/25/18  4:42 AM  Result Value Ref Range   Sodium 157 (H) 135 - 145 mmol/L   Potassium 3.7 3.5 - 5.1 mmol/L   Chloride 129 (H) 98 - 111 mmol/L   CO2 18 (L) 22 - 32 mmol/L   Glucose, Bld 197 (H) 70 - 99 mg/dL   BUN 137 (H) 8 - 23 mg/dL    Comment: RESULT CONFIRMED BY MANUAL DILUTION SDR   Creatinine, Ser 5.60 (H) 0.61 - 1.24 mg/dL   Calcium 7.9 (L) 8.9 - 10.3 mg/dL   GFR calc non Af Amer 10 (L) >60 mL/min   GFR calc Af Amer 11 (L) >60 mL/min    Comment: (NOTE) The eGFR has been calculated using the CKD EPI equation. This calculation has not been validated in all clinical situations. eGFR's persistently <60 mL/min signify possible Chronic Kidney Disease.    Anion gap 10 5 - 15    Comment: Performed at [facility], [address] [place],  [address]  CBC  Status: Abnormal   Collection Time: 09/25/18  4:42 AM  Result Value Ref Range   WBC 8.2 3.8 - 10.6 K/uL    RBC 2.43 (L) 4.40 - 5.90 MIL/uL   Hemoglobin 6.8 (L) 13.0 - 18.0 g/dL    Comment: RESULT REPEATED AND VERIFIED   HCT 19.9 (L) 40.0 - 52.0 %   MCV 81.7 80.0 - 100.0 fL   MCH 28.1 26.0 - 34.0 pg   MCHC 34.4 32.0 - 36.0 g/dL   RDW 17.6 (H) 11.5 - 14.5 %   Platelets 81 (L) 150 - 440 K/uL    Comment: Performed at [facility], [address] [place], [place] [address]  Procalcitonin     Status: None   Collection Time: 09/25/18  4:42 AM  Result Value Ref Range   Procalcitonin 1.17 ng/mL    Comment:        Interpretation: PCT > 0.5 ng/mL and <= 2 ng/mL: Systemic infection (sepsis) is possible, but other conditions are known to elevate PCT as well. (NOTE)       Sepsis PCT Algorithm           Lower Respiratory Tract                                      Infection PCT Algorithm    ----------------------------     ----------------------------         PCT < 0.25 ng/mL                PCT < 0.10 ng/mL         Strongly encourage             Strongly discourage   discontinuation of antibiotics    initiation of antibiotics    ----------------------------     -----------------------------       PCT 0.25 - 0.50 ng/mL            PCT 0.10 - 0.25 ng/mL               OR       >80% decrease in PCT            Discourage initiation of                                            antibiotics      Encourage discontinuation           of antibiotics    ----------------------------     -----------------------------         PCT >= 0.50 ng/mL              PCT 0.26 - 0.50 ng/mL                AND       <80% decrease in PCT             Encourage initiation of                                             antibiotics       Encourage continuation           of antibiotics    ----------------------------     -----------------------------  PCT >= 0.50 ng/mL                  PCT > 0.50 ng/mL               AND         increase in PCT                  Strongly encourage                                       initiation of antibiotics    Strongly encourage escalation           of antibiotics                                     -----------------------------                                           PCT <= 0.25 ng/mL                                                 OR                                        > 80% decrease in PCT                                     Discontinue / Do not initiate                                             antibiotics Performed at [facility], [address] [place], [place] [address]   CK     Status: Abnormal   Collection Time: 09/25/18  4:42 AM  Result Value Ref Range   Total CK 43 (L) 49 - 397 U/L    Comment: Performed at [facility], [address] [place], [place] [address]  PSA, total and free     Status: Abnormal   Collection Time: 09/25/18  4:42 AM  Result Value Ref Range   PSA, Free 24.50 N/A ng/mL    Comment: Roche ECLIA methodology.   PSA, Free Pct 37.6 %    Comment: (NOTE) The table below lists the probability of prostate cancer for men with non-suspicious DRE results and total PSA between 4 and 10 ng/mL, by patient age Ricci Barker, Thomas, 101:7510).                  % Free PSA       50-64 yr        65-75 yr                  0.00-10.00%        56%  55%                 10.01-15.00%        24%             35%                 15.01-20.00%        17%             23%                 20.01-25.00%        10%             20%                      >25.00%         5%              9% Please note:  [place] et al did not make specific              recommendations regarding the use of              percent free PSA for any other population              of men. Performed At: [facility] [place], [place] [id number] Rush Farmer MD [id number]    Prostate Specific Ag, Serum 65.1 (H) 0.0 - 4.0 ng/mL    Comment: (NOTE) Roche ECLIA methodology. According to the American Urological  Association, Serum PSA should decrease and remain at undetectable levels after radical prostatectomy. The AUA defines biochemical recurrence as an initial PSA value 0.2 ng/mL or greater followed by a subsequent confirmatory PSA value 0.2 ng/mL or greater. Values obtained with different assay methods or kits cannot be used interchangeably. Results cannot be interpreted as absolute evidence of the presence or absence of malignant disease.   Glucose, capillary     Status: Abnormal   Collection Time: 09/25/18  7:48 AM  Result Value Ref Range   Glucose-Capillary 138 (H) 70 - 99 mg/dL  Basic metabolic panel     Status: Abnormal   Collection Time: 09/25/18  9:52 AM  Result Value Ref Range   Sodium 155 (H) 135 - 145 mmol/L   Potassium 3.8 3.5 - 5.1 mmol/L   Chloride 126 (H) 98 - 111 mmol/L   CO2 18 (L) 22 - 32 mmol/L   Glucose, Bld 133 (H) 70 - 99 mg/dL   BUN 134 (H) 8 - 23 mg/dL    Comment: RESULT CONFIRMED BY MANUAL DILUTION...[facility]   Creatinine, Ser 5.32 (H) 0.61 - 1.24 mg/dL   Calcium 7.9 (L) 8.9 - 10.3 mg/dL   GFR calc non Af Amer 11 (L) >60 mL/min   GFR calc Af Amer 12 (L) >60 mL/min    Comment: (NOTE) The eGFR has been calculated using the CKD EPI equation. This calculation has not been validated in all clinical situations. eGFR's persistently <60 mL/min signify possible Chronic Kidney Disease.    Anion gap 11 5 - 15    Comment: Performed at [facility], [address] [place], [place] [address]  Hemoglobin and hematocrit, blood     Status: Abnormal   Collection Time: 09/25/18  9:52 AM  Result Value Ref Range   Hemoglobin 7.9 (L) 13.0 - 18.0 g/dL   HCT 22.7 (L) 40.0 - 52.0 %    Comment: Performed at [facility], [address] [place], [place] [address]  Glucose, capillary     Status: Abnormal   Collection Time: 09/25/18 11:49 AM  Result Value Ref Range   Glucose-Capillary 122 (H) 70 - 99 mg/dL  Glucose, capillary     Status: Abnormal   Collection  Time: 09/25/18  4:57 PM  Result Value Ref Range   Glucose-Capillary 166 (H) 70 - 99 mg/dL  Basic metabolic panel     Status: Abnormal   Collection Time: 09/25/18  6:00 PM  Result Value Ref Range   Sodium 151 (H) 135 - 145 mmol/L   Potassium 3.4 (L) 3.5 - 5.1 mmol/L   Chloride 125 (H) 98 - 111 mmol/L   CO2 19 (L) 22 - 32 mmol/L   Glucose, Bld 177 (H) 70 - 99 mg/dL   BUN 115 (H) 8 - 23 mg/dL    Comment: RESULT CONFIRMED BY MANUAL DILUTION AKT   Creatinine, Ser 4.24 (H) 0.61 - 1.24 mg/dL   Calcium 7.4 (L) 8.9 - 10.3 mg/dL   GFR calc non Af Amer 14 (L) >60 mL/min   GFR calc Af Amer 16 (L) >60 mL/min    Comment: (NOTE) The eGFR has been calculated using the CKD EPI equation. This calculation has not been validated in all clinical situations. eGFR's persistently <60 mL/min signify possible Chronic Kidney Disease.    Anion gap 7 5 - 15    Comment: Performed at  [facility], [address] [place], [place] [address]  Hemoglobin and hematocrit, blood     Status: Abnormal   Collection Time: 09/25/18  6:00 PM  Result Value Ref Range   Hemoglobin 7.8 (L) 13.0 - 18.0 g/dL   HCT 22.2 (L) 40.0 - 52.0 %    Comment: Performed at [facility], [address] [place], [place] [address]  Glucose, capillary     Status: Abnormal   Collection Time: 09/25/18  7:49 PM  Result Value Ref Range   Glucose-Capillary 145 (H) 70 - 99 mg/dL  Vitamin B12     Status: None   Collection Time: 09/25/18  8:00 PM  Result Value Ref Range   Vitamin B-12 460 180 - 914 pg/mL    Comment: (NOTE) This assay is not validated for testing neonatal or myeloproliferative syndrome specimens for Vitamin B12 levels. Performed at [facility], [place] [address] [place], [place] [address]   Basic metabolic panel     Status: Abnormal   Collection Time: 09/25/18  8:36 PM  Result Value Ref Range   Sodium 151 (H) 135 - 145 mmol/L   Potassium 3.4 (L) 3.5 - 5.1 mmol/L   Chloride 125 (H) 98 - 111 mmol/L    CO2 20 (L) 22 - 32 mmol/L   Glucose, Bld 167 (H) 70 - 99 mg/dL   BUN 114 (H) 8 - 23 mg/dL    Comment: RESULT CONFIRMED BY MANUAL DILUTION AKT   Creatinine, Ser 3.89 (H) 0.61 - 1.24 mg/dL   Calcium 7.5 (L) 8.9 - 10.3 mg/dL   GFR calc non Af Amer 15 (L) >60 mL/min   GFR calc Af Amer 18 (L) >60 mL/min    Comment: (NOTE) The eGFR has been calculated using the CKD EPI equation. This calculation has not been validated in all clinical situations. eGFR's persistently <60 mL/min signify possible Chronic Kidney Disease.    Anion gap 6 5 - 15    Comment: Performed at [facility], [place], [place] [address]  Lactate dehydrogenase  Status: None   Collection Time: 09/25/18  8:36 PM  Result Value Ref Range   LDH 126 98 - 192 U/L    Comment: Performed at [facility], [address] [place], [place] [address]  Ferritin     Status: Abnormal   Collection Time: 09/25/18  8:36 PM  Result Value Ref Range   Ferritin 991 (H) 24 - 336 ng/mL    Comment: Performed at [facility], [address] [place], [place] [address]  Iron and TIBC     Status: Abnormal   Collection Time: 09/25/18  8:36 PM  Result Value Ref Range   Iron 109 45 - 182 ug/dL   TIBC 116 (L) 250 - 450 ug/dL   Saturation Ratios 94 (H) 17.9 - 39.5 %   UIBC 7 ug/dL    Comment: Performed at [facility], [address], [place], [place] [address]  Folate     Status: None   Collection Time: 09/25/18  8:36 PM  Result Value Ref Range   Folate 71.7 >5.9 ng/mL    Comment: RESULT CONFIRMED BY MANUAL DILUTION AKT Performed at [facility], [address] [place], [place] [address]   Lactic acid, plasma     Status: None   Collection Time: 09/25/18  9:45 PM  Result Value Ref Range   Lactic Acid, Venous 1.8 0.5 - 1.9 mmol/L    Comment: Performed at [facility], [address] [place], [place] [address]  Glucose, capillary     Status: None   Collection Time:  09/25/18 11:44 PM  Result Value Ref Range   Glucose-Capillary 89 70 - 99 mg/dL  Sodium     Status: Abnormal   Collection Time: 09/26/18 12:30 AM  Result Value Ref Range   Sodium 152 (H) 135 - 145 mmol/L    Comment: Performed at [facility], [address] [place],  [address]  Glucose, capillary     Status: None   Collection Time: 09/26/18  2:43 AM  Result Value Ref Range   Glucose-Capillary 95 70 - 99 mg/dL  Procalcitonin     Status: None   Collection Time: 09/26/18  2:49 AM  Result Value Ref Range   Procalcitonin 0.90 ng/mL    Comment:        Interpretation: PCT > 0.5 ng/mL and <= 2 ng/mL: Systemic infection (sepsis) is possible, but other conditions are known to elevate PCT as well. (NOTE)       Sepsis PCT Algorithm           Lower Respiratory Tract                                      Infection PCT Algorithm    ----------------------------     ----------------------------         PCT < 0.25 ng/mL                PCT < 0.10 ng/mL         Strongly encourage             Strongly discourage   discontinuation of antibiotics    initiation of antibiotics    ----------------------------     -----------------------------       PCT 0.25 - 0.50 ng/mL            PCT 0.10 - 0.25 ng/mL  OR       >80% decrease in PCT            Discourage initiation of                                            antibiotics      Encourage discontinuation           of antibiotics    ----------------------------     -----------------------------         PCT >= 0.50 ng/mL              PCT 0.26 - 0.50 ng/mL                AND       <80% decrease in PCT             Encourage initiation of                                             antibiotics       Encourage continuation           of antibiotics    ----------------------------     -----------------------------        PCT >= 0.50 ng/mL                  PCT > 0.50 ng/mL               AND         increase in PCT                   Strongly encourage                                      initiation of antibiotics    Strongly encourage escalation           of antibiotics                                     -----------------------------                                           PCT <= 0.25 ng/mL                                                 OR                                        > 80% decrease in PCT                                     Discontinue / Do not initiate  antibiotics Performed at [facility], [address] [place], [place] [address]   CBC     Status: Abnormal   Collection Time: 09/26/18  2:49 AM  Result Value Ref Range   WBC 6.1 3.8 - 10.6 K/uL   RBC 2.46 (L) 4.40 - 5.90 MIL/uL   Hemoglobin 7.2 (L) 13.0 - 18.0 g/dL   HCT 20.7 (L) 40.0 - 52.0 %   MCV 84.1 80.0 - 100.0 fL   MCH 29.0 26.0 - 34.0 pg   MCHC 34.5 32.0 - 36.0 g/dL   RDW 17.6 (H) 11.5 - 14.5 %   Platelets 42 (L) 150 - 440 K/uL    Comment: Performed at [facility], [address] [place], [place] [address]  Sodium     Status: Abnormal   Collection Time: 09/26/18  2:49 AM  Result Value Ref Range   Sodium 153 (H) 135 - 145 mmol/L    Comment: Performed at [facility], [address] [place], [place] [address]  Protime-INR     Status: Abnormal   Collection Time: 09/26/18  3:53 AM  Result Value Ref Range   Prothrombin Time 17.7 (H) 11.4 - 15.2 seconds   INR 1.47     Comment: Performed at [facility], [address] [place], [place] [address]  APTT     Status: None   Collection Time: 09/26/18  3:53 AM  Result Value Ref Range   aPTT 34 24 - 36 seconds    Comment: Performed at [facility], [address] [place], [place] [address]  Glucose, capillary     Status: Abnormal   Collection Time: 09/26/18  4:00 AM  Result Value Ref Range   Glucose-Capillary 108 (H) 70 - 99 mg/dL  Sodium     Status: Abnormal   Collection Time: 09/26/18   4:05 AM  Result Value Ref Range   Sodium 152 (H) 135 - 145 mmol/L    Comment: Performed at [facility], [address] [place], [place] [address]  Sodium     Status: Abnormal   Collection Time: 09/26/18  5:26 AM  Result Value Ref Range   Sodium 152 (H) 135 - 145 mmol/L    Comment: Performed at [facility], [address] [place],  [address]  Glucose, capillary     Status: Abnormal   Collection Time: 09/26/18  7:14 AM  Result Value Ref Range   Glucose-Capillary 119 (H) 70 - 99 mg/dL  Basic metabolic panel     Status: Abnormal   Collection Time: 09/26/18  8:14 AM  Result Value Ref Range   Sodium 152 (H) 135 - 145 mmol/L   Potassium 3.0 (L) 3.5 - 5.1 mmol/L   Chloride 125 (H) 98 - 111 mmol/L   CO2 20 (L) 22 - 32 mmol/L   Glucose, Bld 144 (H) 70 - 99 mg/dL   BUN 100 (H) 8 - 23 mg/dL   Creatinine, Ser 3.72 (H) 0.61 - 1.24 mg/dL   Calcium 7.6 (L) 8.9 - 10.3 mg/dL   GFR calc non Af Amer 16 (L) >60 mL/min   GFR calc Af Amer 19 (L) >60 mL/min    Comment: (NOTE) The eGFR has been calculated using the CKD EPI equation. This calculation has not been validated in all clinical situations. eGFR's persistently <60 mL/min signify possible Chronic Kidney Disease.    Anion gap 7 5 - 15    Comment: Performed at [facility], [address],  [place], [place] [address]  CBC with Differential/Platelet     Status: Abnormal   Collection Time: 09/26/18  8:14 AM  Result Value Ref Range   WBC 7.5 3.8 - 10.6 K/uL   RBC 2.32 (L) 4.40 - 5.90 MIL/uL   Hemoglobin 6.7 (L) 13.0 - 18.0 g/dL   HCT 19.2 (L) 40.0 - 52.0 %   MCV 82.9 80.0 - 100.0 fL   MCH 28.9 26.0 - 34.0 pg   MCHC 34.8 32.0 - 36.0 g/dL   RDW 17.8 (H) 11.5 - 14.5 %   Platelets 68 (L) 150 - 440 K/uL   Neutrophils Relative % 75 %   Neutro Abs 5.7 1.4 - 6.5 K/uL   Lymphocytes Relative 21 %   Lymphs Abs 1.6 1.0 - 3.6 K/uL   Monocytes Relative 3 %   Monocytes Absolute 0.2 0.2 - 1.0 K/uL   Eosinophils  Relative 1 %   Eosinophils Absolute 0.0 0 - 0.7 K/uL   Basophils Relative 0 %   Basophils Absolute 0.0 0 - 0.1 K/uL    Comment: Performed at [facility], [address] [place], [place] [address]  Magnesium     Status: None   Collection Time: 09/26/18  8:14 AM  Result Value Ref Range   Magnesium 1.8 1.7 - 2.4 mg/dL    Comment: Performed at [facility], [address] [place], [place] [address]  Phosphorus     Status: None   Collection Time: 09/26/18  8:14 AM  Result Value Ref Range   Phosphorus 3.6 2.5 - 4.6 mg/dL    Comment: Performed at [facility], [address] [place], [place] [address]  Prepare RBC     Status: None   Collection Time: 09/26/18 10:19 AM  Result Value Ref Range   Order Confirmation      ORDER PROCESSED BY BLOOD BANK Performed at [facility], [address] [place], [place] [address]   Glucose, capillary     Status: Abnormal   Collection Time: 09/26/18 11:32 AM  Result Value Ref Range   Glucose-Capillary 145 (H) 70 - 99 mg/dL  Sodium     Status: Abnormal   Collection Time: 09/26/18 12:18 PM  Result Value Ref Range   Sodium 150 (H) 135 - 145 mmol/L    Comment: Performed at [facility], [address] [place], [place] [address]  Glucose, capillary     Status: Abnormal   Collection Time: 09/26/18  4:20 PM  Result Value Ref Range   Glucose-Capillary 150 (H) 70 - 99 mg/dL    Current Facility-Administered Medications  Medication Dose Route Frequency Provider Last Rate Last Dose  . 0.9 %  sodium chloride infusion (Manually program via Guardrails IV Fluids)   Intravenous Once Awilda Bill, NP      . 0.9 %  sodium chloride infusion (Manually program via Guardrails IV Fluids)   Intravenous Once Awilda Bill, NP      . cefTRIAXone (ROCEPHIN) 2 g in sodium chloride 0.9 % 100 mL IVPB  2 g Intravenous q1800 Flora Lipps, MD 200 mL/hr at 09/26/18 1651 2 g at 09/26/18 1651  . dexmedetomidine (PRECEDEX)  400 MCG/100ML (4 mcg/mL) infusion  0.4-1.2 mcg/kg/hr Intravenous Continuous Awilda Bill, NP   Stopped at 09/25/18 1145  . dextrose 5 % solution   Intravenous Continuous Holley Raring, [place], MD 125 mL/hr at 09/26/18 1354    . docusate sodium (COLACE) capsule 100 mg  100 mg Oral BID Epifanio Lesches,  MD      . feeding supplement (ENSURE ENLIVE) (ENSURE ENLIVE) liquid 237 mL  237 mL Oral BID BM Kasa, Kurian, MD      . folic acid injection 1 mg  1 mg Intravenous Daily Awilda Bill, NP   1 mg at 09/26/18 0748  . insulin aspart (novoLOG) injection 0-9 Units  0-9 Units Subcutaneous Q4H Awilda Bill, NP   1 Units at 09/26/18 1651  . lidocaine (XYLOCAINE) 2 % jelly 1 application  1 application Urethral Once Awilda Bill, NP      . LORazepam (ATIVAN) injection 2-3 mg  2-3 mg Intravenous Q1H PRN Flora Lipps, MD      . morphine 2 MG/ML injection 2 mg  2 mg Intravenous Q2H PRN Flora Lipps, MD   2 mg at 09/26/18 1403  . multivitamin with minerals tablet 1 tablet  1 tablet Oral Daily Flora Lipps, MD   1 tablet at 09/26/18 1141  . norepinephrine (LEVOPHED) 16 mg in dextrose 5 % 250 mL (0.064 mg/mL) infusion  0-40 mcg/min Intravenous Titrated Awilda Bill, NP   Stopped at 09/26/18 0451  . ondansetron (ZOFRAN) tablet 4 mg  4 mg Oral Q6H PRN Epifanio Lesches, MD       Or  . ondansetron (ZOFRAN) injection 4 mg  4 mg Intravenous Q6H PRN Epifanio Lesches, MD      . opium-belladonna (B&O SUPPRETTES) 16.2-60 MG suppository 1 suppository  1 suppository Rectal Q8H PRN Stoioff, Ronda Fairly, MD      . Derrill Memo ON 09/28/2018] pantoprazole (PROTONIX) injection 40 mg  40 mg Intravenous Q12H Epifanio Lesches, MD      . sodium chloride flush (NS) 0.9 % injection 10-40 mL  10-40 mL Intracatheter Q12H Epifanio Lesches, MD   10 mL at 09/26/18 0749  . sodium chloride flush (NS) 0.9 % injection 10-40 mL  10-40 mL Intracatheter PRN Epifanio Lesches, MD      . thiamine (B-1) injection 100 mg  100 mg  Intravenous Daily Awilda Bill, NP   100 mg at 09/26/18 0748  . vitamin C (ASCORBIC ACID) tablet 500 mg  500 mg Oral BID Flora Lipps, MD   500 mg at 09/26/18 1141    Musculoskeletal: Strength & Muscle Tone: decreased and atrophy Gait & Station: unsteady Patient leans: N/A  Psychiatric Specialty Exam: Physical Exam  Nursing note and vitals reviewed. Constitutional: He appears well-developed.  HENT:  Head: Normocephalic and atraumatic.  Eyes: Pupils are equal, round, and reactive to light. Conjunctivae are normal.  Neck: Normal range of motion.  Cardiovascular: Normal heart sounds.  Respiratory: Effort normal. No respiratory distress.  GI: Soft.  Musculoskeletal: Normal range of motion.  Neurological: He is alert.  Skin: Skin is warm and dry.  Psychiatric: His affect is blunt. His speech is tangential. He is not agitated and not aggressive. Thought content is not paranoid. Cognition and memory are impaired. He expresses no homicidal and no suicidal ideation.    Review of Systems  Constitutional: Negative.   HENT: Negative.   Eyes: Negative.   Respiratory: Negative.   Cardiovascular: Negative.   Gastrointestinal: Negative.   Musculoskeletal: Negative.   Skin: Negative.   Neurological: Negative.   Psychiatric/Behavioral: Negative.     Blood pressure (!) 78/58, pulse 70, temperature (!) 97.3 F (36.3 C), temperature source Oral, resp. rate 17, height 5' 7"  (1.702 m), weight 59.2 kg, SpO2 97 %.Body mass index is 20.44 kg/m.  General Appearance: Disheveled  Eye Contact:  Fair  Speech:  Slow  Volume:  Decreased  Mood:  Depressed  Affect:  Constricted  Thought Process:  Coherent  Orientation:  Full (Time, Place, and Person)  Thought Content:  Logical  Suicidal Thoughts:  No  Homicidal Thoughts:  No  Memory:  Immediate;   Fair Recent;   Poor Remote;   Poor  Judgement:  Poor  Insight:  Shallow  Psychomotor Activity:  Decreased  Concentration:  Concentration: Poor   Recall:  Poor  Fund of Knowledge:  Poor  Language:  Poor  Akathisia:  No  Handed:  Right  AIMS (if indicated):     Assets:  Resilience  ADL's:  Impaired  Cognition:  Impaired,  Moderate  Sleep:        Treatment Plan Summary: Plan The patient fortunately does not appear to be suffering.  I would not be surprised if he had developed some alcohol persisting dementia given his demeanor but I did not undertake to actually test him for it today.  Patient does not require any specific psychiatric treatment at this point.  I will sign off unless there are further needs.  Disposition: Patient does not meet criteria for psychiatric inpatient admission. Supportive therapy provided about ongoing stressors.  Alethia Berthold, MD 09/26/2018 5:08 PM

## 2018-09-26 NOTE — Progress Notes (Signed)
   CHIEF COMPLAINT:   Mental status changes   Subjective  Pain all over No SOB Weaned off pressors  Na down to 152  CT abd and chest-bony mets, liver cirrhosis C/w probable metastatic prostate   Continue bladder irrigation with gross hemturia    Objective   VITALS: BP 98/64   Pulse 79   Temp 97.7 F (36.5 C) (Axillary)   Resp 14   Ht 5\' 7"  (1.702 m)   Wt 59.2 kg   SpO2 100%   BMI 20.44 kg/m    Review of Systems:  Gen:  Denies  fever, sweats, chills weigh loss +pain all over HEENT: Denies blurred vision, double vision, ear pain, eye pain, hearing loss, nose bleeds, sore throat Cardiac:  No dizziness, chest pain or heaviness, chest tightness,edema, No JVD Resp: no SIB Gi: Denies swallowing difficulty, stomach pain, nausea or vomiting, diarrhea, constipation, bowel incontinence Other:  All other systems negative  Physical Examination:   GENERAL:NAD, no fevers, chills, no weakness no fatigue HEAD: Normocephalic, atraumatic.  EYES: Pupils equal, round, reactive to light. Extraocular muscles intact. No scleral icterus.  MOUTH: Moist mucosal membrane. Dentition intact. No abscess noted.  EAR, NOSE, THROAT: Clear without exudates. No external lesions.  NECK: Supple. No thyromegaly. No nodules. No JVD.  PULMONARY: CTA B/L no wheezing, rhonchi, crackles CARDIOVASCULAR: S1 and S2. Regular rate and rhythm. No murmurs, rubs, or gallops. No edema. Pedal pulses 2+ bilaterally.  GASTROINTESTINAL: Soft, nontender, nondistended. No masses. Positive bowel sounds. No hepatosplenomegaly.  MUSCULOSKELETAL: No swelling, clubbing, or edema. Range of motion full in all extremities.  NEUROLOGIC: Cranial nerves II through XII are intact. No gross focal neurological deficits. Sensation intact. Reflexes intact.  SKIN: No ulceration, lesions, rashes, or cyanosis. Skin warm and dry. Turgor intact.  PSYCHIATRIC: Mood, affect within normal limits. The patient is awake, alert and oriented x 3.  Insight, judgment intact.  ALL OTHER ROS ARE NEGATIVE   I personally reviewed Labs under Results section.   Septic shock ?UTI -use vasopressors to keep MAP>65 -follow ABG and LA -follow up cultures -emperic ABX -consider stress dose steroids -aggressive IV fluid Resuscitation   Severe Anemia/acidosis Transfuse as needed  Wide spread mets probable prostate cancer Follow up oncology recs   Gross hematuria Needs SD monitoring Follow up urology recs  ETOH abuse with liver cirrhosis High risk for DT's CIWA protocol Stop PPI and octreotide   Palliative care consulted     Taziyah Iannuzzi Patricia Pesa, M.D.  [facility]  Medical Director [place] Director [facility] Cardio-Pulmonary Department

## 2018-09-26 NOTE — Progress Notes (Signed)
Pharmacy Electrolyte Monitoring Consult:  Pharmacy consulted to assist in monitoring and replacing electrolytes in this 62 y.o. male admitted on 09/24/2018 with Code Sepsis   Labs:  Sodium (mmol/L)  Date Value  09/26/2018 148 (H)   Potassium (mmol/L)  Date Value  09/26/2018 3.6   Magnesium (mg/dL)  Date Value  09/26/2018 1.8   Phosphorus (mg/dL)  Date Value  09/26/2018 3.6   Calcium (mg/dL)  Date Value  09/26/2018 7.6 (L)   Albumin (g/dL)  Date Value  09/24/2018 2.4 (L)    Assessment/Plan: 10/02 @1800  Potassium 3.6 and therefore will not give additional supplementation at this time.  Will monitor electrolytes with morning labs.  Pharmacy to follow patient and replace electrolytes per consult.  Forrest Moron, PharmD 09/26/2018 6:47 PM

## 2018-09-26 NOTE — Consult Note (Signed)
[place]  [phone] [phone]   Name: Joseph Trevino Date: 09/26/2018 MRN: [id number]  DOB: 18-May-1956  Patient Care Team: Patient, [name] as PCP - General (General Practice)    REASON FOR CONSULTATION: Palliative Care consult requested for this 62 y.o. male for goals of medical treatment in patient with multiple medical problems including COPD, tobacco and alcohol abuse, who was admitted on 9/30 with altered mental status after being found unresponsive in his hotel room, apparently surrounded by empty alcohol bottles and covered in stool.  He initially refused transport to the ER but was involuntarily committed.  Initial work-up revealed multiple metabolic derangements, acute renal failure, severe anemia with hematuria, and UTI.  CT scan revealed widespread sclerotic osseous metastases likely in the setting of prostate cancer.  Palliative care was consulted to help establish medical goals.   SOCIAL HISTORY:  Patient was living at the [facility] in [place] for several weeks prior to his admission to the hospital. He has a history of being homeless. He has a son and daughter, from whom he says he is estranged. He was   ADVANCE DIRECTIVES:  Unknown  CODE STATUS: Full code  PAST MEDICAL HISTORY: Past Medical History:  Diagnosis Date  . COPD (chronic obstructive pulmonary disease) ([place])   . Tobacco abuse     PAST SURGICAL HISTORY: History reviewed. No pertinent surgical history.  HEMATOLOGY/ONCOLOGY HISTORY:   No history exists.    ALLERGIES:  has No Known Allergies.  MEDICATIONS:  Current Facility-Administered Medications  Medication Dose Route Frequency Provider Last Rate Last Dose  . 0.9 %  sodium chloride infusion (Manually program via Guardrails IV Fluids)   Intravenous Once Awilda Bill, NP      . 0.9 %  sodium chloride infusion (Manually program via Guardrails IV Fluids)   Intravenous  Once Awilda Bill, NP      . cefTRIAXone (ROCEPHIN) 2 g in sodium chloride 0.9 % 100 mL IVPB  2 g Intravenous q1800 Flora Lipps, MD 200 mL/hr at 09/25/18 1855 2 g at 09/25/18 1855  . dexmedetomidine (PRECEDEX) 400 MCG/100ML (4 mcg/mL) infusion  0.4-1.2 mcg/kg/hr Intravenous Continuous Awilda Bill, NP   Stopped at 09/25/18 1145  . dextrose 5 % solution   Intravenous Continuous Holley Raring, [place], MD 125 mL/hr at 09/26/18 1354    . docusate sodium (COLACE) capsule 100 mg  100 mg Oral BID Epifanio Lesches, MD      . feeding supplement (ENSURE ENLIVE) (ENSURE ENLIVE) liquid 237 mL  237 mL Oral BID BM Kasa, Kurian, MD      . folic acid injection 1 mg  1 mg Intravenous Daily Awilda Bill, NP   1 mg at 09/26/18 0748  . insulin aspart (novoLOG) injection 0-9 Units  0-9 Units Subcutaneous Q4H Awilda Bill, NP   1 Units at 09/26/18 1141  . lidocaine (XYLOCAINE) 2 % jelly 1 application  1 application Urethral Once Awilda Bill, NP      . LORazepam (ATIVAN) injection 2-3 mg  2-3 mg Intravenous Q1H PRN Flora Lipps, MD      . morphine 2 MG/ML injection 2 mg  2 mg Intravenous Q2H PRN Flora Lipps, MD   2 mg at 09/26/18 1403  . multivitamin with minerals tablet 1 tablet  1 tablet Oral Daily Flora Lipps, MD   1 tablet at 09/26/18 1141  . norepinephrine (LEVOPHED) 16 mg in dextrose 5 % 250 mL (  0.064 mg/mL) infusion  0-40 mcg/min Intravenous Titrated Awilda Bill, NP   Stopped at 09/26/18 0451  . ondansetron (ZOFRAN) tablet 4 mg  4 mg Oral Q6H PRN Epifanio Lesches, MD       Or  . ondansetron (ZOFRAN) injection 4 mg  4 mg Intravenous Q6H PRN Epifanio Lesches, MD      . opium-belladonna (B&O SUPPRETTES) 16.2-60 MG suppository 1 suppository  1 suppository Rectal Q8H PRN Stoioff, Ronda Fairly, MD      . Derrill Memo ON 09/28/2018] pantoprazole (PROTONIX) injection 40 mg  40 mg Intravenous Q12H Epifanio Lesches, MD      . sodium chloride flush (NS) 0.9 % injection 10-40 mL  10-40 mL  Intracatheter Q12H Epifanio Lesches, MD   10 mL at 09/26/18 0749  . sodium chloride flush (NS) 0.9 % injection 10-40 mL  10-40 mL Intracatheter PRN Epifanio Lesches, MD      . thiamine (B-1) injection 100 mg  100 mg Intravenous Daily Awilda Bill, NP   100 mg at 09/26/18 0748  . vitamin C (ASCORBIC ACID) tablet 500 mg  500 mg Oral BID Flora Lipps, MD   500 mg at 09/26/18 1141    VITAL SIGNS: BP 103/62   Pulse 96   Temp 97.6 F (36.4 C) (Axillary)   Resp 20   Ht _0  (1.702 m)   Wt 130 lb 8.2 oz (59.2 kg)   SpO2 98%   BMI 20.44 kg/m  Filed Weights   09/24/18 1457 09/24/18 1900 09/26/18 0354  Weight: 156 lb 8.4 oz (71 kg) 116 lb 6.5 oz (52.8 kg) 130 lb 8.2 oz (59.2 kg)    Estimated body mass index is 20.44 kg/m as calculated from the following:   Height as of this encounter: _1  (1.702 m).   Weight as of this encounter: 130 lb 8.2 oz (59.2 kg).  LABS: CBC:    Component Value Date/Time   WBC 7.5 09/26/2018 0814   HGB 6.7 (L) 09/26/2018 0814   HCT 19.2 (L) 09/26/2018 0814   PLT 68 (L) 09/26/2018 0814   MCV 82.9 09/26/2018 0814   NEUTROABS 5.7 09/26/2018 0814   LYMPHSABS 1.6 09/26/2018 0814   MONOABS 0.2 09/26/2018 0814   EOSABS 0.0 09/26/2018 0814   BASOSABS 0.0 09/26/2018 0814   Comprehensive Metabolic Panel:    Component Value Date/Time   NA 150 (H) 09/26/2018 1218   K 3.0 (L) 09/26/2018 0814   CL 125 (H) 09/26/2018 0814   CO2 20 (L) 09/26/2018 0814   BUN 100 (H) 09/26/2018 0814   CREATININE 3.72 (H) 09/26/2018 0814   GLUCOSE 144 (H) 09/26/2018 0814   CALCIUM 7.6 (L) 09/26/2018 0814   AST 63 (H) 09/24/2018 1931   ALT 17 09/24/2018 1931   ALKPHOS 155 (H) 09/24/2018 1931   BILITOT 1.8 (H) 09/24/2018 1931   PROT 6.4 (L) 09/24/2018 1931   ALBUMIN 2.4 (L) 09/24/2018 1931    RADIOGRAPHIC STUDIES: Ct Abdomen Pelvis Wo Contrast  Result Date: 09/24/2018 CLINICAL DATA:  Altered mental status. Found on the floor. EXAM: CT ABDOMEN AND PELVIS WITHOUT  CONTRAST TECHNIQUE: Multidetector CT imaging of the abdomen and pelvis was performed following the standard protocol without IV contrast. COMPARISON:  None. FINDINGS: Lower chest: Clear lung bases. Hepatobiliary: Multiple small gallstones in the dependent portion of the gallbladder, measuring up to 5 mm in maximum diameter each. No gallbladder wall thickening or pericholecystic fluid. Somewhat small liver with the exception of mildly prominent lateral segment left  lobe and caudate lobe. The liver has mildly lobulated contours. Pancreas: Unremarkable. No pancreatic ductal dilatation or surrounding inflammatory changes. Spleen: Mildly prominent without splenomegaly. Adrenals/Urinary Tract: Mild to moderate dilatation of both renal collecting systems and moderate dilatation of both ureters to the level of the urinary bladder. Foley catheter in the bladder with associated air in the bladder. The bladder is distended, despite presence of the Foley catheter. No calculi seen. Normal appearing adrenal glands. Stomach/Bowel: Unremarkable stomach, small bowel and colon. No evidence of appendicitis. Vascular/Lymphatic: Atheromatous calcifications. Small focal infrarenal abdominal aortic aneurysm measuring 3.1 cm in maximum diameter on image number 42 series 2. No enlarged lymph nodes. Reproductive: Prostate is unremarkable. Other: Minimal free peritoneal fluid adjacent to the liver. Small to moderate-sized umbilical hernia containing fat. Musculoskeletal: Diffuse patchy sclerosis of the bones. IMPRESSION: 1. Mild to moderate bilateral hydronephrosis and moderate bilateral hydroureter to the level of a mildly distended urinary bladder, despite presence of a Foley catheter. No obstructing calculi are seen. 2. Diffuse patchy bone sclerosis with appearance compatible with metastatic prostate cancer. 3. Cholelithiasis without evidence of cholecystitis. 4. Findings suggesting cirrhosis of the liver with minimal ascites. 5. 3.1 cm  infrarenal abdominal aortic aneurysm. Recommend followup by ultrasound in 3 years. This recommendation follows ACR consensus guidelines: White Paper of the ACR Incidental Findings Committee II on Vascular Findings. Joellyn Rued Radiol 2013; [id number] Electronically Signed   By: Claudie Revering M.D.   On: 09/24/2018 19:10   Ct Head Wo Contrast  Result Date: 09/24/2018 CLINICAL DATA:  Altered mental status EXAM: CT HEAD WITHOUT CONTRAST TECHNIQUE: Contiguous axial images were obtained from the base of the skull through the vertex without intravenous contrast. COMPARISON:  None. FINDINGS: Brain: Diffusely enlarged ventricles and subarachnoid spaces. Patchy white matter low density in both cerebral hemispheres. No intracranial hemorrhage, mass lesion or CT evidence of acute infarction. Vascular: No hyperdense vessel or unexpected calcification. Skull: Normal. Negative for fracture or focal lesion. Sinuses/Orbits: Status post bilateral cataract extraction. Unremarkable paranasal sinuses. Other: None. IMPRESSION: 1. No acute abnormality. 2. Moderate diffuse cerebral and cerebellar atrophy and mild chronic small vessel white matter ischemic changes in both cerebral hemispheres. Electronically Signed   By: Claudie Revering M.D.   On: 09/24/2018 19:01   Ct Chest Wo Contrast  Result Date: 09/25/2018 CLINICAL DATA:  Altered mental status, anemia, acute renal failure, thrombocytopenia. Widespread sclerotic osseous lesions, suspicious for metastatic disease. EXAM: CT CHEST WITHOUT CONTRAST TECHNIQUE: Multidetector CT imaging of the chest was performed following the standard protocol without IV contrast. COMPARISON:  Chest radiographs dated 09/24/2018. CTA chest dated 03/25/2016. FINDINGS: Cardiovascular: The heart is normal in size. No pericardial effusion. No evidence of thoracic aortic aneurysm. Atherosclerotic calcifications aortic root/arch. Three vessel coronary atherosclerosis. Mediastinum/Nodes: No suspicious mediastinal  lymphadenopathy. Visualized thyroid is unremarkable. Lungs/Pleura: Biapical pleural-parenchymal scarring. Small right pleural effusion, partially loculated. Small left pleural effusion. No focal consolidation. No pleural effusion or pneumothorax. Upper Abdomen: Unchanged from recent CT, noting mild perihepatic ascites. Musculoskeletal: Widespread/diffuse sclerotic osseous metastases throughout the visualized axial and appendicular skeleton, suggestive of metastatic prostate cancer. IMPRESSION: Widespread/diffuse sclerotic osseous metastases throughout the visualized axial and appendicular skeleton, suggestive of metastatic prostate cancer. Small bilateral pleural effusions, partially loculated on the right. Mild perihepatic ascites, incompletely visualized. Aortic Atherosclerosis (ICD10-I70.0). Electronically Signed   By: Julian Hy M.D.   On: 09/25/2018 23:59   Dg Chest Port 1 View  Result Date: 09/24/2018 CLINICAL DATA:  62 year old male with sepsis. EXAM: PORTABLE CHEST  1 VIEW COMPARISON:  Chest radiographs 04/20/2016 and earlier. FINDINGS: Pacer or resuscitation pads projecting over the central chest. Somewhat large lung volumes. Mediastinal contours remain normal. Visualized tracheal air column is within normal limits. Allowing for portable technique the lungs are clear. No pneumothorax. Questionable heterogeneous bone mineralization throughout the visible chest and spine. This might be artifact due to technique. Paucity of bowel gas in the upper abdomen. IMPRESSION: 1.  No acute cardiopulmonary abnormality. 2. Questionable new generalized abnormal sclerosis in the visible ribs and spine. This may be artifact but sclerotic metastatic disease to bone is difficult to exclude. Electronically Signed   By: Genevie Ann M.D.   On: 09/24/2018 16:31    PERFORMANCE STATUS (ECOG) : 2 - Symptomatic, <50% confined to bed  Review of Systems Unable to complete  Physical Exam General: Frail,  ill-appearing Lungs: Clear to auscultation bilaterally. Heart: Regular rate and rhythm. Abdomen: Soft, nontender, nondistended. No organomegaly noted, normoactive bowel sounds. Musculoskeletal: No edema, cyanosis, or clubbing. Neuro: Alert, oriented to person and place only. Skin: petechiae to chest. Psych: Tangential speech pattern  IMPRESSION: I met with patient in the ICU.  I attempted to engage with him in a conversation about his current medical problems and goals.  However, patient's speech and thought processes seem to be tangential and erratic at times.  He seems to have very poor understanding of his current health problems and was unable to relate to me any information previously given to him by the other medical providers that have consulted today.  His cognitive baseline is unknown. It is possible that might have underlying cognitive impairment given chronic alcohol use.   I discussed with patient the probable diagnosis of cancer.  He told me "99% of people have cancer.  You get it from rubbing a dollar bill."  I question patient's capacity for medical decision-making.  Case discussed with Dr. Mortimer Fries. I would recommend formal evaluation by regarding patient's capacity.   Patient seems somewhat suspicious when asking him about his social support or if he knew anyone who could help him with medical decision making.  Patient says that he has a son and daughter but that he has not seen nor talked with them in many years.  He has a spouse listed in the EMR but patient says that he was divorced and that his ex-wife later died.  He denies having any friends or caregivers.  Patient does tell me that his son's name is [employer].  His daughter's name is Ekansh Sherk.  Patient does not recall his daughter's middle name.  We will try to see if we can locate the patient's children.  PLAN: 1. Recommend formal capacity evaluation 2. Continue supportive care 3. Will speak with SW to see if  it is possible to locate family  Time Total: 60 minutes  Visit consisted of counseling and education dealing with the complex and emotionally intense issues of symptom management and palliative care in the setting of serious and potentially life-threatening illness.Greater than 50%  of this time was spent counseling and coordinating care related to the above assessment and plan.  Signed by: Altha Harm, [place], NP-C, [place] (Work Cell)

## 2018-09-26 NOTE — Progress Notes (Signed)
[facility]  ROUNDING NOTE   Subjective:  Patient much more awake and alert. Still has significant acute renal failure. Sodium still high at 152. No new creatinine today.   Objective:  Vital signs in last 24 hours:  Temp:  [96.6 F (35.9 C)-97.9 F (36.6 C)] 97.7 F (36.5 C) (10/02 0630) Pulse Rate:  [53-100] 79 (10/02 0630) Resp:  [11-22] 14 (10/02 0630) BP: (88-119)/(59-97) 98/64 (10/02 0630) SpO2:  [73 %-100 %] 100 % (10/02 0630) Weight:  [59.2 kg] 59.2 kg (10/02 0354)  Weight change: -11.8 kg Filed Weights   09/24/18 1457 09/24/18 1900 09/26/18 0354  Weight: 71 kg 52.8 kg 59.2 kg    Intake/Output: I/O last 3 completed shifts: In: 80913 [I.V.:176.5; Blood:1343.3; BWGYK:59935; IV Piggyback:1303.2] Out: 70177 [Urine:85355]   Intake/Output this shift:  No intake/output data recorded.  Physical Exam: General: Critically ill appearing  Head: Normocephalic, atraumatic. DRy oral mucosal membranes  Eyes: Anicteric  Neck: Supple, trachea midline  Lungs:  Clear to auscultation, normal effort  Heart: S1S2 no rubs  Abdomen:  Soft, lower abdominal distension noted  Extremities: no peripheral edema.  Neurologic: Awake, alert, following commands  Skin: B/L UE ecchymoses       Basic Metabolic Panel: Recent Labs  Lab 09/24/18 2245 09/25/18 0442 09/25/18 0952 09/25/18 1800 09/25/18 2036 09/26/18 0030 09/26/18 0249 09/26/18 0405 09/26/18 0526  NA 161* 157* 155* 151* 151* 152* 153* 152* 152*  K 3.5 3.7 3.8 3.4* 3.4*  --   --   --   --   CL >130* 129* 126* 125* 125*  --   --   --   --   CO2 19* 18* 18* 19* 20*  --   --   --   --   GLUCOSE 143* 197* 133* 177* 167*  --   --   --   --   BUN 138* 137* 134* 115* 114*  --   --   --   --   CREATININE 5.68* 5.60* 5.32* 4.24* 3.89*  --   --   --   --   CALCIUM 7.6* 7.9* 7.9* 7.4* 7.5*  --   --   --   --     Liver Function Tests: Recent Labs  Lab 09/24/18 1454 09/24/18 1931  AST 48* 63*  ALT 11 17   ALKPHOS 179* 155*  BILITOT 1.9* 1.8*  PROT 6.7 6.4*  ALBUMIN 2.5* 2.4*   No results for input(s): LIPASE, AMYLASE in the last 168 hours. Recent Labs  Lab 09/24/18 1931  AMMONIA 42*    CBC: Recent Labs  Lab 09/24/18 1454 09/24/18 1931 09/25/18 0053 09/25/18 0442 09/25/18 0952 09/25/18 1800 09/26/18 0249  WBC 9.9 5.6  --  8.2  --   --  6.1  NEUTROABS 7.6* 4.5  --   --   --   --   --   HGB 3.8* 7.1* 5.1* 6.8* 7.9* 7.8* 7.2*  HCT 12.7* 21.7* 15.1* 19.9* 22.7* 22.2* 20.7*  MCV 89.9 83.5  --  81.7  --   --  84.1  PLT 68* 38*  --  81*  --   --  42*    Cardiac Enzymes: Recent Labs  Lab 09/24/18 1454 09/25/18 0442  CKTOTAL 73 43*  TROPONINI 0.05*  --     BNP: Invalid input(s): POCBNP  CBG: Recent Labs  Lab 09/25/18 1949 09/25/18 2344 09/26/18 0243 09/26/18 0400 09/26/18 0714  GLUCAP 145* 89 95 108* 119*  Microbiology: Results for orders placed or performed during the hospital encounter of 09/24/18  Blood Culture (routine x 2)     Status: None (Preliminary result)   Collection Time: 09/24/18  2:54 PM  Result Value Ref Range Status   Specimen Description BLOOD RIGHT AC  Final   Special Requests   Final    BOTTLES DRAWN AEROBIC AND ANAEROBIC Blood Culture results may not be optimal due to an excessive volume of blood received in culture bottles   Culture   Final    NO GROWTH 2 DAYS Performed at [facility], [address], [place], [place] [address]    Report Status PENDING  Incomplete  MRSA PCR Screening     Status: None   Collection Time: 09/24/18  8:43 PM  Result Value Ref Range Status   MRSA by PCR NEGATIVE NEGATIVE Final    Comment:        The GeneXpert MRSA Assay (FDA approved for NASAL specimens only), is one component of a comprehensive MRSA colonization surveillance program. It is not intended to diagnose MRSA infection nor to guide or monitor treatment for MRSA infections. Performed at [facility], [address] [place],  [address]     Coagulation Studies: Recent Labs    09/24/18 1931 09/26/18 0353  LABPROT 22.3* 17.7*  INR 1.98 1.47    Urinalysis: Recent Labs    09/24/18 1455  COLORURINE AMBER*  LABSPEC 1.012  PHURINE 5.0  GLUCOSEU NEGATIVE  HGBUR SMALL*  BILIRUBINUR NEGATIVE  KETONESUR NEGATIVE  PROTEINUR NEGATIVE  NITRITE NEGATIVE  LEUKOCYTESUR MODERATE*      Imaging: Ct Abdomen Pelvis Wo Contrast  Result Date: 09/24/2018 CLINICAL DATA:  Altered mental status. Found on the floor. EXAM: CT ABDOMEN AND PELVIS WITHOUT CONTRAST TECHNIQUE: Multidetector CT imaging of the abdomen and pelvis was performed following the standard protocol without IV contrast. COMPARISON:  None. FINDINGS: Lower chest: Clear lung bases. Hepatobiliary: Multiple small gallstones in the dependent portion of the gallbladder, measuring up to 5 mm in maximum diameter each. No gallbladder wall thickening or pericholecystic fluid. Somewhat small liver with the exception of mildly prominent lateral segment left lobe and caudate lobe. The liver has mildly lobulated contours. Pancreas: Unremarkable. No pancreatic ductal dilatation or surrounding inflammatory changes. Spleen: Mildly prominent without splenomegaly. Adrenals/Urinary Tract: Mild to moderate dilatation of both renal collecting systems and moderate dilatation of both ureters to the level of the urinary bladder. Foley catheter in the bladder with associated air in the bladder. The bladder is distended, despite presence of the Foley catheter. No calculi seen. Normal appearing adrenal glands. Stomach/Bowel: Unremarkable stomach, small bowel and colon. No evidence of appendicitis. Vascular/Lymphatic: Atheromatous calcifications. Small focal infrarenal abdominal aortic aneurysm measuring 3.1 cm in maximum diameter on image number 42 series 2. No enlarged lymph nodes. Reproductive: Prostate is unremarkable. Other: Minimal free peritoneal fluid adjacent to the  liver. Small to moderate-sized umbilical hernia containing fat. Musculoskeletal: Diffuse patchy sclerosis of the bones. IMPRESSION: 1. Mild to moderate bilateral hydronephrosis and moderate bilateral hydroureter to the level of a mildly distended urinary bladder, despite presence of a Foley catheter. No obstructing calculi are seen. 2. Diffuse patchy bone sclerosis with appearance compatible with metastatic prostate cancer. 3. Cholelithiasis without evidence of cholecystitis. 4. Findings suggesting cirrhosis of the liver with minimal ascites. 5. 3.1 cm infrarenal abdominal aortic aneurysm. Recommend followup by ultrasound in 3 years. This recommendation follows ACR consensus guidelines: White Paper of the ACR Incidental Findings Committee II  on Vascular Findings. Joellyn Rued Radiol 2013; [phone] Electronically Signed   By: Claudie Revering M.D.   On: 09/24/2018 19:10   Ct Head Wo Contrast  Result Date: 09/24/2018 CLINICAL DATA:  Altered mental status EXAM: CT HEAD WITHOUT CONTRAST TECHNIQUE: Contiguous axial images were obtained from the base of the skull through the vertex without intravenous contrast. COMPARISON:  None. FINDINGS: Brain: Diffusely enlarged ventricles and subarachnoid spaces. Patchy white matter low density in both cerebral hemispheres. No intracranial hemorrhage, mass lesion or CT evidence of acute infarction. Vascular: No hyperdense vessel or unexpected calcification. Skull: Normal. Negative for fracture or focal lesion. Sinuses/Orbits: Status post bilateral cataract extraction. Unremarkable paranasal sinuses. Other: None. IMPRESSION: 1. No acute abnormality. 2. Moderate diffuse cerebral and cerebellar atrophy and mild chronic small vessel white matter ischemic changes in both cerebral hemispheres. Electronically Signed   By: Claudie Revering M.D.   On: 09/24/2018 19:01   Ct Chest Wo Contrast  Result Date: 09/25/2018 CLINICAL DATA:  Altered mental status, anemia, acute renal failure,  thrombocytopenia. Widespread sclerotic osseous lesions, suspicious for metastatic disease. EXAM: CT CHEST WITHOUT CONTRAST TECHNIQUE: Multidetector CT imaging of the chest was performed following the standard protocol without IV contrast. COMPARISON:  Chest radiographs dated 09/24/2018. CTA chest dated 03/25/2016. FINDINGS: Cardiovascular: The heart is normal in size. No pericardial effusion. No evidence of thoracic aortic aneurysm. Atherosclerotic calcifications aortic root/arch. Three vessel coronary atherosclerosis. Mediastinum/Nodes: No suspicious mediastinal lymphadenopathy. Visualized thyroid is unremarkable. Lungs/Pleura: Biapical pleural-parenchymal scarring. Small right pleural effusion, partially loculated. Small left pleural effusion. No focal consolidation. No pleural effusion or pneumothorax. Upper Abdomen: Unchanged from recent CT, noting mild perihepatic ascites. Musculoskeletal: Widespread/diffuse sclerotic osseous metastases throughout the visualized axial and appendicular skeleton, suggestive of metastatic prostate cancer. IMPRESSION: Widespread/diffuse sclerotic osseous metastases throughout the visualized axial and appendicular skeleton, suggestive of metastatic prostate cancer. Small bilateral pleural effusions, partially loculated on the right. Mild perihepatic ascites, incompletely visualized. Aortic Atherosclerosis (ICD10-I70.0). Electronically Signed   By: Julian Hy M.D.   On: 09/25/2018 23:59   Dg Chest Port 1 View  Result Date: 09/24/2018 CLINICAL DATA:  62 year old male with sepsis. EXAM: PORTABLE CHEST 1 VIEW COMPARISON:  Chest radiographs 04/20/2016 and earlier. FINDINGS: Pacer or resuscitation pads projecting over the central chest. Somewhat large lung volumes. Mediastinal contours remain normal. Visualized tracheal air column is within normal limits. Allowing for portable technique the lungs are clear. No pneumothorax. Questionable heterogeneous bone mineralization  throughout the visible chest and spine. This might be artifact due to technique. Paucity of bowel gas in the upper abdomen. IMPRESSION: 1.  No acute cardiopulmonary abnormality. 2. Questionable new generalized abnormal sclerosis in the visible ribs and spine. This may be artifact but sclerotic metastatic disease to bone is difficult to exclude. Electronically Signed   By: Genevie Ann M.D.   On: 09/24/2018 16:31     Medications:   . cefTRIAXone (ROCEPHIN)  IV 2 g (09/25/18 1855)  . dexmedetomidine (PRECEDEX) IV infusion Stopped (09/25/18 1145)  . dextrose 125 mL/hr at 09/26/18 0733  . norepinephrine (LEVOPHED) 16mg  / 28mL infusion Stopped (09/26/18 0451)   . sodium chloride   Intravenous Once  . sodium chloride   Intravenous Once  . docusate sodium  100 mg Oral BID  . folic acid  1 mg Intravenous Daily  . insulin aspart  0-9 Units Subcutaneous Q4H  . lidocaine  1 application Urethral Once  . [START ON 09/28/2018] pantoprazole  40 mg Intravenous Q12H  . sodium chloride  flush  10-40 mL Intracatheter Q12H  . thiamine injection  100 mg Intravenous Daily   LORazepam, morphine injection, ondansetron **OR** ondansetron (ZOFRAN) IV, sodium chloride flush  Assessment/ Plan:  62 y.o. male with a PMHx of COPD, tobacco abuse, who was admitted to [facility] on 09/24/2018 for evaluation of altered mental status.  It appears that the patient has been residing at a local hotel for approximately 3 months and was abusing ETOH.  Found to have severe metabolic deranagements at admission.   1.  Acute renal failure.  2.  Hypernatremia. 3.  Urinary retention/bilateral hydronephrosis.  4.  Severe anemia.   5.  Metabolic/lactic acidosis.   Plan: Patient remains critically ill at the moment.  His most recent creatinine was down to 3.89.  He still has significant hypernatremia.  Increase D5W to 125 cc/h.  Patient does appear to be making urine.  He is on continuous bladder irrigation.  Continue to monitor serum  electrolytes.  No indication for dialysis as BUN and creatinine are improving.  However overall prognosis is guarded as he appears to be have diffusely metastatic disease.  Appreciate additional consult and input.  We will continue to monitor along with you.   LOS: 2 Ivonna Kinnick 10/2/20197:59 AM

## 2018-09-26 NOTE — Progress Notes (Signed)
Pt turned by writing RN as he allowed this shift, he prefers to be touched as little as possible and seems sore all over.  He repeatedly refused a bath or bed change, and also refused to allow his gown to be changed.  He averaged approx 3000 cc/hour in and out of CBI this shift, output continues red.  If irrigation runs too slowly or clots at all writing RN was able to palpate hardened distended bladder in lower ABD and he would repeatedly say he needed to "get up and piss." PT unable to be redirected and does not tend to recall instruction.  He rests quietly with NAD as long as irrigation ran well.  Minimal clotting easily removed t/o shift. Manager of [employer] called to check on patient this shift; he reports patient has lived there for a year a more.  Unable to give out any info d/t no password.  Afterward pt refused to give me permission to speak with this person or any other who called. Writing RN also asked patient if I could call anyone for him this morning and he stated there was no one. Pt reports a fair appetite and ate with total assist from me.

## 2018-09-26 NOTE — Progress Notes (Signed)
Pharmacy Electrolyte Monitoring Consult:  Pharmacy consulted to assist in monitoring and replacing electrolytes in this 62 y.o. male admitted on 09/24/2018 with Code Sepsis   Labs:  Sodium (mmol/L)  Date Value  09/26/2018 150 (H)   Potassium (mmol/L)  Date Value  09/26/2018 3.0 (L)   Magnesium (mg/dL)  Date Value  09/26/2018 1.8   Phosphorus (mg/dL)  Date Value  09/26/2018 3.6   Calcium (mg/dL)  Date Value  09/26/2018 7.6 (L)   Albumin (g/dL)  Date Value  09/24/2018 2.4 (L)    Assessment/Plan: Potassium 10 mEq IV x 5 runs ordered. Patient receiving last run now. With patient's renal function, will check potassium at 1800.  Will order electrolytes with morning labs.  Pharmacy to follow patient and replace electrolytes per consult.  Tawnya Crook, PharmD Pharmacy Resident  09/26/2018 2:24 PM

## 2018-09-26 NOTE — Progress Notes (Addendum)
Initial Nutrition Assessment  DOCUMENTATION CODES:   Severe malnutrition in context of chronic illness  INTERVENTION:   Ensure Enlive po BID, each supplement provides 350 kcal and 20 grams of protein  MVI, thiamine and folic acid daily in setting of etoh abuse  Vitamin C 582m po BID  Magic cup TID with meals, each supplement provides 290 kcal and 9 grams of protein  Pt at high refeeding risk; recommend monitor K, Mg and P labs daily until stable.   NUTRITION DIAGNOSIS:   Severe Malnutrition related to chronic illness(COPD, etoh abuse, probable prostate cancer with bone mets ) as evidenced by severe fat depletion, severe muscle depletion.  GOAL:   Patient will meet greater than or equal to 90% of their needs  MONITOR:   PO intake, Supplement acceptance, Labs, Weight trends, Skin, I & O's  REASON FOR ASSESSMENT:   Malnutrition Screening Tool    ASSESSMENT:   62y.o. male with a PMHx of COPD, tobacco abuse, [facility].  It appears that the patient has been residing at a local hotel for approximately 3 months and was abusing ETOH.  Pt found to have cirrhosis and probable prostate cancer with bone mets   Met with pt in room today. Pt is a poor historian but reports that he can not remember when he last ate. Pt reports that he is hungry today. Pt initiated on a clear liquid diet but never received a tray. Plan is to advance diet as tolerated. Pt is willing to drink supplements. Pt on CIWA protocol. Pt unsure if he has had any weight loss. There is no recent weight history in chart but it appears that pt has lost 26lbs(17%) since 2017. 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 Will initiate vitamin C today. Pt started on dextrose today and at high refeed risk; recommend monitor K, Mg and P labs. Palliative care consult pending.   Medications reviewed and include: colace, folic acid,  insulin, MVI, protonix, thiamine, ceftriaxone, 5% dextrose _0 /hr, KCl  Labs reviewed: Na 152(H), K 3.0(L), BUN 100(H), creat 3.72(H), P 3.6 wnl, Mg 1.8 wnl Hgb 6.7(L), Hct 19.2(L) cbgs- 95, 108, 119, 145 x 24 hrs  NUTRITION - FOCUSED PHYSICAL EXAM:    Most Recent Value  Orbital Region  Severe depletion  Upper Arm Region  Severe depletion  Thoracic and Lumbar Region  Severe depletion  Buccal Region  Severe depletion  Temple Region  Severe depletion  Clavicle Bone Region  Severe depletion  Clavicle and Acromion Bone Region  Severe depletion  Scapular Bone Region  Severe depletion  Dorsal Hand  Severe depletion  Patellar Region  Moderate depletion  Anterior Thigh Region  Moderate depletion  Posterior Calf Region  Moderate depletion  Edema (RD Assessment)  Mild  Hair  Reviewed  Eyes  Reviewed  Mouth  Reviewed  Skin  Reviewed [ecchymosis and petchiae]  Nails  Reviewed     Diet Order:   Diet Order            Diet clear liquid Room service appropriate? Yes; Fluid consistency: Thin  Diet effective now             EDUCATION NEEDS:   Not appropriate for education at this time  Skin:  Skin Assessment: Reviewed RN Assessment(ecchymosis and petchiae )  Last BM:  10/2- type 2  Height:   Ht Readings from Last 1 Encounters:  09/24/18  5' 7" (1.702 m)    Weight:   Wt Readings from Last 1 Encounters:  09/26/18 59.2 kg    Ideal Body Weight:  67.3 kg  BMI:  Body mass index is 20.44 kg/m.  Estimated Nutritional Needs:   Kcal:  1900-2200kcal/day   Protein:  84-95g/day   Fluid:  >1.8L/day   Koleen Distance MS, RD, LDN Pager #- [phone] Office#- [phone] After Hours Pager: [phone]

## 2018-09-26 NOTE — Progress Notes (Signed)
[facility] NAME: Joseph Trevino    MR#:  [id number]  DATE OF BIRTH:  10-27-1956  SUBJECTIVE:  CHIEF COMPLAINT:   Chief Complaint  Patient presents with  . Code Sepsis  Patient remains quite ill  REVIEW OF SYSTEMS:  CONSTITUTIONAL: No fever, fatigue or weakness.  EYES: No blurred or double vision.  EARS, NOSE, AND THROAT: No tinnitus or ear pain.  RESPIRATORY: No cough, shortness of breath, wheezing or hemoptysis.  CARDIOVASCULAR: No chest pain, orthopnea, edema.  GASTROINTESTINAL: No nausea, vomiting, diarrhea or abdominal pain.  GENITOURINARY: No dysuria, hematuria.  ENDOCRINE: No polyuria, nocturia,  HEMATOLOGY: No anemia, easy bruising or bleeding SKIN: No rash or lesion. MUSCULOSKELETAL: No joint pain or arthritis.   NEUROLOGIC: No tingling, numbness, weakness.  PSYCHIATRY: No anxiety or depression.   ROS  DRUG ALLERGIES:  No Known Allergies  VITALS:  Blood pressure 103/78, pulse 84, temperature 97.6 F (36.4 C), temperature source Axillary, resp. rate 18, height 5\' 7"  (1.702 m), weight 59.2 kg, SpO2 97 %.  PHYSICAL EXAMINATION:  GENERAL:  62 y.o.-year-old patient lying in the bed with no acute distress.  EYES: Pupils equal, round, reactive to light and accommodation. No scleral icterus. Extraocular muscles intact.  HEENT: Head atraumatic, normocephalic. Oropharynx and nasopharynx clear.  NECK:  Supple, no jugular venous distention. No thyroid enlargement, no tenderness.  LUNGS: Normal breath sounds bilaterally, no wheezing, rales,rhonchi or crepitation. No use of accessory muscles of respiration.  CARDIOVASCULAR: S1, S2 normal. No murmurs, rubs, or gallops.  ABDOMEN: Soft, nontender, nondistended. Bowel sounds present. No organomegaly or mass.  EXTREMITIES: No pedal edema, cyanosis, or clubbing.  NEUROLOGIC: Cranial nerves II through XII are intact. Muscle strength 5/5 in all extremities. Sensation intact. Gait not  checked.  PSYCHIATRIC: The patient is alert and oriented x 3.  SKIN: No obvious rash, lesion, or ulcer.   Physical Exam LABORATORY PANEL:   CBC Recent Labs  Lab 09/26/18 0814  WBC 7.5  HGB 6.7*  HCT 19.2*  PLT 68*   ------------------------------------------------------------------------------------------------------------------  Chemistries  Recent Labs  Lab 09/24/18 1931  09/26/18 0814 09/26/18 1218  NA 161*   < > 152* 150*  K 4.0   < > 3.0*  --   CL >130*   < > 125*  --   CO2 17*   < > 20*  --   GLUCOSE 179*   < > 144*  --   BUN 155*   < > 100*  --   CREATININE 6.66*   < > 3.72*  --   CALCIUM 8.4*   < > 7.6*  --   MG  --   --  1.8  --   AST 63*  --   --   --   ALT 17  --   --   --   ALKPHOS 155*  --   --   --   BILITOT 1.8*  --   --   --    < > = values in this interval not displayed.   ------------------------------------------------------------------------------------------------------------------  Cardiac Enzymes Recent Labs  Lab 09/24/18 1454  TROPONINI 0.05*   ------------------------------------------------------------------------------------------------------------------  RADIOLOGY:  Ct Abdomen Pelvis Wo Contrast  Result Date: 09/24/2018 CLINICAL DATA:  Altered mental status. Found on the floor. EXAM: CT ABDOMEN AND PELVIS WITHOUT CONTRAST TECHNIQUE: Multidetector CT imaging of the abdomen and pelvis was performed following the standard protocol without IV contrast. COMPARISON:  None. FINDINGS: Lower chest: Clear lung  bases. Hepatobiliary: Multiple small gallstones in the dependent portion of the gallbladder, measuring up to 5 mm in maximum diameter each. No gallbladder wall thickening or pericholecystic fluid. Somewhat small liver with the exception of mildly prominent lateral segment left lobe and caudate lobe. The liver has mildly lobulated contours. Pancreas: Unremarkable. No pancreatic ductal dilatation or surrounding inflammatory changes. Spleen:  Mildly prominent without splenomegaly. Adrenals/Urinary Tract: Mild to moderate dilatation of both renal collecting systems and moderate dilatation of both ureters to the level of the urinary bladder. Foley catheter in the bladder with associated air in the bladder. The bladder is distended, despite presence of the Foley catheter. No calculi seen. Normal appearing adrenal glands. Stomach/Bowel: Unremarkable stomach, small bowel and colon. No evidence of appendicitis. Vascular/Lymphatic: Atheromatous calcifications. Small focal infrarenal abdominal aortic aneurysm measuring 3.1 cm in maximum diameter on image number 42 series 2. No enlarged lymph nodes. Reproductive: Prostate is unremarkable. Other: Minimal free peritoneal fluid adjacent to the liver. Small to moderate-sized umbilical hernia containing fat. Musculoskeletal: Diffuse patchy sclerosis of the bones. IMPRESSION: 1. Mild to moderate bilateral hydronephrosis and moderate bilateral hydroureter to the level of a mildly distended urinary bladder, despite presence of a Foley catheter. No obstructing calculi are seen. 2. Diffuse patchy bone sclerosis with appearance compatible with metastatic prostate cancer. 3. Cholelithiasis without evidence of cholecystitis. 4. Findings suggesting cirrhosis of the liver with minimal ascites. 5. 3.1 cm infrarenal abdominal aortic aneurysm. Recommend followup by ultrasound in 3 years. This recommendation follows ACR consensus guidelines: White Paper of the ACR Incidental Findings Committee II on Vascular Findings. Joellyn Rued Radiol 2013; [phone] Electronically Signed   By: Claudie Revering M.D.   On: 09/24/2018 19:10   Ct Head Wo Contrast  Result Date: 09/24/2018 CLINICAL DATA:  Altered mental status EXAM: CT HEAD WITHOUT CONTRAST TECHNIQUE: Contiguous axial images were obtained from the base of the skull through the vertex without intravenous contrast. COMPARISON:  None. FINDINGS: Brain: Diffusely enlarged ventricles and  subarachnoid spaces. Patchy white matter low density in both cerebral hemispheres. No intracranial hemorrhage, mass lesion or CT evidence of acute infarction. Vascular: No hyperdense vessel or unexpected calcification. Skull: Normal. Negative for fracture or focal lesion. Sinuses/Orbits: Status post bilateral cataract extraction. Unremarkable paranasal sinuses. Other: None. IMPRESSION: 1. No acute abnormality. 2. Moderate diffuse cerebral and cerebellar atrophy and mild chronic small vessel white matter ischemic changes in both cerebral hemispheres. Electronically Signed   By: Claudie Revering M.D.   On: 09/24/2018 19:01   Ct Chest Wo Contrast  Result Date: 09/25/2018 CLINICAL DATA:  Altered mental status, anemia, acute renal failure, thrombocytopenia. Widespread sclerotic osseous lesions, suspicious for metastatic disease. EXAM: CT CHEST WITHOUT CONTRAST TECHNIQUE: Multidetector CT imaging of the chest was performed following the standard protocol without IV contrast. COMPARISON:  Chest radiographs dated 09/24/2018. CTA chest dated 03/25/2016. FINDINGS: Cardiovascular: The heart is normal in size. No pericardial effusion. No evidence of thoracic aortic aneurysm. Atherosclerotic calcifications aortic root/arch. Three vessel coronary atherosclerosis. Mediastinum/Nodes: No suspicious mediastinal lymphadenopathy. Visualized thyroid is unremarkable. Lungs/Pleura: Biapical pleural-parenchymal scarring. Small right pleural effusion, partially loculated. Small left pleural effusion. No focal consolidation. No pleural effusion or pneumothorax. Upper Abdomen: Unchanged from recent CT, noting mild perihepatic ascites. Musculoskeletal: Widespread/diffuse sclerotic osseous metastases throughout the visualized axial and appendicular skeleton, suggestive of metastatic prostate cancer. IMPRESSION: Widespread/diffuse sclerotic osseous metastases throughout the visualized axial and appendicular skeleton, suggestive of metastatic  prostate cancer. Small bilateral pleural effusions, partially loculated on the right. Mild perihepatic  ascites, incompletely visualized. Aortic Atherosclerosis (ICD10-I70.0). Electronically Signed   By: Julian Hy M.D.   On: 09/25/2018 23:59   Dg Chest Port 1 View  Result Date: 09/24/2018 CLINICAL DATA:  62 year old male with sepsis. EXAM: PORTABLE CHEST 1 VIEW COMPARISON:  Chest radiographs 04/20/2016 and earlier. FINDINGS: Pacer or resuscitation pads projecting over the central chest. Somewhat large lung volumes. Mediastinal contours remain normal. Visualized tracheal air column is within normal limits. Allowing for portable technique the lungs are clear. No pneumothorax. Questionable heterogeneous bone mineralization throughout the visible chest and spine. This might be artifact due to technique. Paucity of bowel gas in the upper abdomen. IMPRESSION: 1.  No acute cardiopulmonary abnormality. 2. Questionable new generalized abnormal sclerosis in the visible ribs and spine. This may be artifact but sclerotic metastatic disease to bone is difficult to exclude. Electronically Signed   By: Genevie Ann M.D.   On: 09/24/2018 16:31    ASSESSMENT AND PLAN:  Patient 62 year old admitted with severe anemia acute renal failure hypothermia  *Acute septic shock  Compounded by acute severe anemia Continue vasopressor support with weaning as tolerated, empiric antibiotics, IV fluids for rehydration   *Acute Severe anemia  Suspected due to gross hematuria  Seen by gastroenterology, was thought not to have variceal or upper GI bleeding/recommended continued CBCs with transfusing as needed/gastroenterology has signed off Patient will need further work-up once stable from ICU standpoint  *Acute gross hematuria Etiology unknown Urology input appreciated, strict I&O monitoring, CBC daily, transfuse as needed  *Acute renal failure Resolving Secondary to multifactorial process which includes urinary  retention/bilateral hydronephrosis, dehydration, and compounded by severe anemia IV fluids for rehydration, avoid nephrotoxic agents, strict I&O monitoring Nephrology and urology input greatly appreciated  *Acute severe hypernatremia due to severe dehydration Continue IV fluids for rehydration, BMP in the morning  *Chronic alcoholism with cirrhosis, thrombocytopenia, electrolyte abnormalities Continue alcohol withdrawal protocol, PPI and octreotide discontinued   *Acute severe hypothermia Secondary to septic shock and severe anemia  Resolved   *Acute newly diagnosed atrial fibrillation Unknown duration Likely secondary to severe anemia, renal failure, hyponatremia  Follow-up on echocardiogram  *Acute incidentally found sclerotic bony lesions Suspicious for metastatic disease Oncology input appreciated-once patient stabilized patient will need biopsy for confirmation, may benefit from Lupron injections every 3-6 months which may be done as an outpatient after biopsy PSA greater than 65 suspicious for metastatic prostate cancer  Long-term prognosis is poor-palliative care consultation recommended  All the records are reviewed and case discussed with Care Management/Social Workerr. Management plans discussed with the patient, family and they are in agreement.  CODE STATUS: full  TOTAL TIME TAKING CARE OF THIS PATIENT: 35 minutes.     POSSIBLE D/C IN 5 DAYS, DEPENDING ON CLINICAL CONDITION.   Avel Peace Zidan Helget M.D on 09/26/2018   Between 7am to 6pm - Pager - [phone]  After 6pm go to [email or web address] - password [email or web address] [facility]  Office  [phone]  CC: Primary care physician; Patient, [name]  Note: This dictation was prepared with Dragon dictation along with smaller phrase technology. Any transcriptional errors that result from this process are unintentional.

## 2018-09-26 NOTE — Progress Notes (Signed)
Urology Consult Follow Up  Subjective: No complaints this morning.  Spoke with nursing and he required manual irrigation one time overnight.  Anti-infectives: Anti-infectives (From admission, onward)   Start     Dose/Rate Route Frequency Ordered Stop   09/25/18 1800  cefTRIAXone (ROCEPHIN) 2 g in sodium chloride 0.9 % 100 mL IVPB     2 g 200 mL/hr over 30 Minutes Intravenous Daily-1800 09/25/18 0956     09/25/18 0830  vancomycin (VANCOCIN) IVPB 1000 mg/200 mL premix  Status:  Discontinued     1,000 mg 200 mL/hr over 60 Minutes Intravenous every 72 hours 09/24/18 1938 09/25/18 0956   09/25/18 0000  ceFEPIme (MAXIPIME) 1 g in sodium chloride 0.9 % 100 mL IVPB  Status:  Discontinued     1 g 200 mL/hr over 30 Minutes Intravenous Every 24 hours 09/24/18 1731 09/24/18 2018   09/24/18 2100  cefTRIAXone (ROCEPHIN) 2 g in sodium chloride 0.9 % 100 mL IVPB  Status:  Discontinued     2 g 200 mL/hr over 30 Minutes Intravenous Every 24 hours 09/24/18 2018 09/25/18 0956   09/24/18 1936  vancomycin variable dose per unstable renal function (pharmacist dosing)  Status:  Discontinued      Does not apply See admin instructions 09/24/18 1938 09/25/18 1037   09/24/18 1600  ceFEPIme (MAXIPIME) 2 g in sodium chloride 0.9 % 100 mL IVPB     2 g 200 mL/hr over 30 Minutes Intravenous  Once 09/24/18 1556 09/24/18 1735   09/24/18 1600  metroNIDAZOLE (FLAGYL) IVPB 500 mg  Status:  Discontinued     500 mg 100 mL/hr over 60 Minutes Intravenous Every 8 hours 09/24/18 1556 09/24/18 2018   09/24/18 1600  vancomycin (VANCOCIN) IVPB 1000 mg/200 mL premix     1,000 mg 200 mL/hr over 60 Minutes Intravenous  Once 09/24/18 1556 09/24/18 1904      Current Facility-Administered Medications  Medication Dose Route Frequency Provider Last Rate Last Dose  . 0.9 %  sodium chloride infusion (Manually program via Guardrails IV Fluids)   Intravenous Once Awilda Bill, NP      . 0.9 %  sodium chloride infusion (Manually  program via Guardrails IV Fluids)   Intravenous Once Awilda Bill, NP      . cefTRIAXone (ROCEPHIN) 2 g in sodium chloride 0.9 % 100 mL IVPB  2 g Intravenous q1800 Charlett Nose, RPH 200 mL/hr at 09/25/18 1855 2 g at 09/25/18 1855  . dexmedetomidine (PRECEDEX) 400 MCG/100ML (4 mcg/mL) infusion  0.4-1.2 mcg/kg/hr Intravenous Continuous Awilda Bill, NP   Stopped at 09/25/18 1145  . dextrose 5 % solution   Intravenous Continuous Holley Raring, [place], MD 125 mL/hr at 09/26/18 0733    . docusate sodium (COLACE) capsule 100 mg  100 mg Oral BID Epifanio Lesches, MD      . folic acid injection 1 mg  1 mg Intravenous Daily Awilda Bill, NP   1 mg at 09/26/18 0748  . insulin aspart (novoLOG) injection 0-9 Units  0-9 Units Subcutaneous Q4H Awilda Bill, NP   1 Units at 09/25/18 2050  . lidocaine (XYLOCAINE) 2 % jelly 1 application  1 application Urethral Once Awilda Bill, NP      . LORazepam (ATIVAN) injection 2-3 mg  2-3 mg Intravenous Q1H PRN Flora Lipps, MD      . morphine 2 MG/ML injection 2 mg  2 mg Intravenous Q2H PRN Flora Lipps, MD   2 mg at 09/26/18  1751  . norepinephrine (LEVOPHED) 16 mg in dextrose 5 % 250 mL (0.064 mg/mL) infusion  0-40 mcg/min Intravenous Titrated Awilda Bill, NP   Stopped at 09/26/18 0451  . ondansetron (ZOFRAN) tablet 4 mg  4 mg Oral Q6H PRN Epifanio Lesches, MD       Or  . ondansetron (ZOFRAN) injection 4 mg  4 mg Intravenous Q6H PRN Epifanio Lesches, MD      . Derrill Memo ON 09/28/2018] pantoprazole (PROTONIX) injection 40 mg  40 mg Intravenous Q12H Epifanio Lesches, MD      . sodium chloride flush (NS) 0.9 % injection 10-40 mL  10-40 mL Intracatheter Q12H Epifanio Lesches, MD   10 mL at 09/26/18 0749  . sodium chloride flush (NS) 0.9 % injection 10-40 mL  10-40 mL Intracatheter PRN Epifanio Lesches, MD      . thiamine (B-1) injection 100 mg  100 mg Intravenous Daily Awilda Bill, NP   100 mg at 09/26/18 0748      Objective: Vital signs in last 24 hours: Temp:  [96.6 F (35.9 C)-97.9 F (36.6 C)] 97.7 F (36.5 C) (10/02 0630) Pulse Rate:  [53-100] 79 (10/02 0630) Resp:  [11-22] 14 (10/02 0630) BP: (88-119)/(59-97) 98/64 (10/02 0630) SpO2:  [73 %-100 %] 100 % (10/02 0630) Weight:  [59.2 kg] 59.2 kg (10/02 0354)  Intake/Output from previous day: 10/01 0701 - 10/02 0700 In: 78460.7 [I.V.:60.7; Blood:300; IV Piggyback:100] Out: 02585 [Urine:84515] Intake/Output this shift: No intake/output data recorded.   Physical Exam: CBI effluent pink-tinged on moderately high inflow.  Lab Results:  Recent Labs    09/25/18 0442  09/25/18 1800 09/26/18 0249  WBC 8.2  --   --  6.1  HGB 6.8*   < > 7.8* 7.2*  HCT 19.9*   < > 22.2* 20.7*  PLT 81*  --   --  42*   < > = values in this interval not displayed.   BMET Recent Labs    09/25/18 1800 09/25/18 2036  09/26/18 0405 09/26/18 0526  NA 151* 151*   < > 152* 152*  K 3.4* 3.4*  --   --   --   CL 125* 125*  --   --   --   CO2 19* 20*  --   --   --   GLUCOSE 177* 167*  --   --   --   BUN 115* 114*  --   --   --   CREATININE 4.24* 3.89*  --   --   --   CALCIUM 7.4* 7.5*  --   --   --    < > = values in this interval not displayed.   PT/INR Recent Labs    09/24/18 1931 09/26/18 0353  LABPROT 22.3* 17.7*  INR 1.98 1.47   ABG Recent Labs    09/24/18 1940  PHART 7.45  HCO3 16.0*    Studies/Results: Ct Abdomen Pelvis Wo Contrast  Result Date: 09/24/2018 CLINICAL DATA:  Altered mental status. Found on the floor. EXAM: CT ABDOMEN AND PELVIS WITHOUT CONTRAST TECHNIQUE: Multidetector CT imaging of the abdomen and pelvis was performed following the standard protocol without IV contrast. COMPARISON:  None. FINDINGS: Lower chest: Clear lung bases. Hepatobiliary: Multiple small gallstones in the dependent portion of the gallbladder, measuring up to 5 mm in maximum diameter each. No gallbladder wall thickening or pericholecystic fluid.  Somewhat small liver with the exception of mildly prominent lateral segment left lobe and caudate lobe. The liver  has mildly lobulated contours. Pancreas: Unremarkable. No pancreatic ductal dilatation or surrounding inflammatory changes. Spleen: Mildly prominent without splenomegaly. Adrenals/Urinary Tract: Mild to moderate dilatation of both renal collecting systems and moderate dilatation of both ureters to the level of the urinary bladder. Foley catheter in the bladder with associated air in the bladder. The bladder is distended, despite presence of the Foley catheter. No calculi seen. Normal appearing adrenal glands. Stomach/Bowel: Unremarkable stomach, small bowel and colon. No evidence of appendicitis. Vascular/Lymphatic: Atheromatous calcifications. Small focal infrarenal abdominal aortic aneurysm measuring 3.1 cm in maximum diameter on image number 42 series 2. No enlarged lymph nodes. Reproductive: Prostate is unremarkable. Other: Minimal free peritoneal fluid adjacent to the liver. Small to moderate-sized umbilical hernia containing fat. Musculoskeletal: Diffuse patchy sclerosis of the bones. IMPRESSION: 1. Mild to moderate bilateral hydronephrosis and moderate bilateral hydroureter to the level of a mildly distended urinary bladder, despite presence of a Foley catheter. No obstructing calculi are seen. 2. Diffuse patchy bone sclerosis with appearance compatible with metastatic prostate cancer. 3. Cholelithiasis without evidence of cholecystitis. 4. Findings suggesting cirrhosis of the liver with minimal ascites. 5. 3.1 cm infrarenal abdominal aortic aneurysm. Recommend followup by ultrasound in 3 years. This recommendation follows ACR consensus guidelines: White Paper of the ACR Incidental Findings Committee II on Vascular Findings. Joellyn Rued Radiol 2013; [phone] Electronically Signed   By: Claudie Revering M.D.   On: 09/24/2018 19:10   Ct Head Wo Contrast  Result Date: 09/24/2018 CLINICAL DATA:   Altered mental status EXAM: CT HEAD WITHOUT CONTRAST TECHNIQUE: Contiguous axial images were obtained from the base of the skull through the vertex without intravenous contrast. COMPARISON:  None. FINDINGS: Brain: Diffusely enlarged ventricles and subarachnoid spaces. Patchy white matter low density in both cerebral hemispheres. No intracranial hemorrhage, mass lesion or CT evidence of acute infarction. Vascular: No hyperdense vessel or unexpected calcification. Skull: Normal. Negative for fracture or focal lesion. Sinuses/Orbits: Status post bilateral cataract extraction. Unremarkable paranasal sinuses. Other: None. IMPRESSION: 1. No acute abnormality. 2. Moderate diffuse cerebral and cerebellar atrophy and mild chronic small vessel white matter ischemic changes in both cerebral hemispheres. Electronically Signed   By: Claudie Revering M.D.   On: 09/24/2018 19:01   Ct Chest Wo Contrast  Result Date: 09/25/2018 CLINICAL DATA:  Altered mental status, anemia, acute renal failure, thrombocytopenia. Widespread sclerotic osseous lesions, suspicious for metastatic disease. EXAM: CT CHEST WITHOUT CONTRAST TECHNIQUE: Multidetector CT imaging of the chest was performed following the standard protocol without IV contrast. COMPARISON:  Chest radiographs dated 09/24/2018. CTA chest dated 03/25/2016. FINDINGS: Cardiovascular: The heart is normal in size. No pericardial effusion. No evidence of thoracic aortic aneurysm. Atherosclerotic calcifications aortic root/arch. Three vessel coronary atherosclerosis. Mediastinum/Nodes: No suspicious mediastinal lymphadenopathy. Visualized thyroid is unremarkable. Lungs/Pleura: Biapical pleural-parenchymal scarring. Small right pleural effusion, partially loculated. Small left pleural effusion. No focal consolidation. No pleural effusion or pneumothorax. Upper Abdomen: Unchanged from recent CT, noting mild perihepatic ascites. Musculoskeletal: Widespread/diffuse sclerotic osseous metastases  throughout the visualized axial and appendicular skeleton, suggestive of metastatic prostate cancer. IMPRESSION: Widespread/diffuse sclerotic osseous metastases throughout the visualized axial and appendicular skeleton, suggestive of metastatic prostate cancer. Small bilateral pleural effusions, partially loculated on the right. Mild perihepatic ascites, incompletely visualized. Aortic Atherosclerosis (ICD10-I70.0). Electronically Signed   By: Julian Hy M.D.   On: 09/25/2018 23:59   Dg Chest Port 1 View  Result Date: 09/24/2018 CLINICAL DATA:  62 year old male with sepsis. EXAM: PORTABLE CHEST 1 VIEW COMPARISON:  Chest radiographs  04/20/2016 and earlier. FINDINGS: Pacer or resuscitation pads projecting over the central chest. Somewhat large lung volumes. Mediastinal contours remain normal. Visualized tracheal air column is within normal limits. Allowing for portable technique the lungs are clear. No pneumothorax. Questionable heterogeneous bone mineralization throughout the visible chest and spine. This might be artifact due to technique. Paucity of bowel gas in the upper abdomen. IMPRESSION: 1.  No acute cardiopulmonary abnormality. 2. Questionable new generalized abnormal sclerosis in the visible ribs and spine. This may be artifact but sclerotic metastatic disease to bone is difficult to exclude. Electronically Signed   By: Genevie Ann M.D.   On: 09/24/2018 16:31     Assessment: Gross hematuria undetermined etiology.  CBI effluent pink however inflow is fairly brisk.  Plan: Titrate to keep effluent pink-tinged.  Manually irrigate as needed.  Will continue to follow    LOS: 2 days    Abbie Sons 09/26/2018

## 2018-09-27 DIAGNOSIS — E43 Unspecified severe protein-calorie malnutrition: Secondary | ICD-10-CM

## 2018-09-27 DIAGNOSIS — R579 Shock, unspecified: Secondary | ICD-10-CM

## 2018-09-27 DIAGNOSIS — G9341 Metabolic encephalopathy: Secondary | ICD-10-CM

## 2018-09-27 DIAGNOSIS — N320 Bladder-neck obstruction: Secondary | ICD-10-CM

## 2018-09-27 DIAGNOSIS — Z515 Encounter for palliative care: Secondary | ICD-10-CM

## 2018-09-27 LAB — SODIUM
SODIUM: 147 mmol/L — AB (ref 135–145)
Sodium: 145 mmol/L (ref 135–145)
Sodium: 144 mmol/L (ref 135–145)

## 2018-09-27 LAB — PREPARE PLATELET PHERESIS: Unit division: 0

## 2018-09-27 LAB — BASIC METABOLIC PANEL
Anion gap: 6 (ref 5–15)
BUN: 79 mg/dL — AB (ref 8–23)
CO2: 20 mmol/L — AB (ref 22–32)
Calcium: 7.2 mg/dL — ABNORMAL LOW (ref 8.9–10.3)
Chloride: 120 mmol/L — ABNORMAL HIGH (ref 98–111)
Creatinine, Ser: 3.02 mg/dL — ABNORMAL HIGH (ref 0.61–1.24)
GFR calc Af Amer: 24 mL/min — ABNORMAL LOW (ref >60)
GFR calc non Af Amer: 21 mL/min — ABNORMAL LOW (ref >60)
GLUCOSE: 136 mg/dL — AB (ref 70–99)
POTASSIUM: 3.4 mmol/L — AB (ref 3.5–5.1)
Sodium: 146 mmol/L — ABNORMAL HIGH (ref 135–145)

## 2018-09-27 LAB — CBC
HCT: 17.8 % — ABNORMAL LOW (ref 40.0–52.0)
Hemoglobin: 6.3 g/dL — ABNORMAL LOW (ref 13.0–18.0)
MCH: 28.1 pg (ref 26.0–34.0)
MCHC: 35.2 g/dL (ref 32.0–36.0)
MCV: 79.9 fL — AB (ref 80.0–100.0)
PLATELETS: 57 10*3/uL — AB (ref 150–440)
RBC: 2.23 MIL/uL — AB (ref 4.40–5.90)
RDW: 18.3 % — AB (ref 11.5–14.5)
WBC: 4.6 10*3/uL (ref 3.8–10.6)

## 2018-09-27 LAB — PREPARE RBC (CROSSMATCH)

## 2018-09-27 LAB — BPAM PLATELET PHERESIS
BLOOD PRODUCT EXPIRATION DATE: 201910032359
ISSUE DATE / TIME: 201910020501
UNIT TYPE AND RH: 6200

## 2018-09-27 LAB — CBC WITH DIFFERENTIAL/PLATELET
BASOS PCT: 0 %
Band Neutrophils: 1 %
Basophils Absolute: 0 10*3/uL (ref 0–0.1)
Blasts: 0 %
EOS PCT: 1 %
Eosinophils Absolute: 0.1 10*3/uL (ref 0–0.7)
HCT: 21.1 % — ABNORMAL LOW (ref 40.0–52.0)
Hemoglobin: 7.5 g/dL — ABNORMAL LOW (ref 13.0–18.0)
LYMPHS ABS: 0.9 10*3/uL — AB (ref 1.0–3.6)
Lymphocytes Relative: 17 %
MCH: 29.4 pg (ref 26.0–34.0)
MCHC: 35.6 g/dL (ref 32.0–36.0)
MCV: 82.5 fL (ref 80.0–100.0)
METAMYELOCYTES PCT: 0 %
MONO ABS: 0.1 10*3/uL — AB (ref 0.2–1.0)
MONOS PCT: 2 %
Myelocytes: 0 %
NEUTROS ABS: 3.9 10*3/uL (ref 1.4–6.5)
NEUTROS PCT: 79 %
NRBC: 1 /100{WBCs} — AB
Other: 0 %
PLATELETS: 54 10*3/uL — AB (ref 150–440)
Promyelocytes Relative: 0 %
RBC: 2.56 MIL/uL — AB (ref 4.40–5.90)
RDW: 18.7 % — AB (ref 11.5–14.5)
WBC: 5 10*3/uL (ref 3.8–10.6)

## 2018-09-27 LAB — GLUCOSE, CAPILLARY
GLUCOSE-CAPILLARY: 118 mg/dL — AB (ref 70–99)
GLUCOSE-CAPILLARY: 113 mg/dL — AB (ref 70–99)
Glucose-Capillary: 128 mg/dL — ABNORMAL HIGH (ref 70–99)
Glucose-Capillary: 127 mg/dL — ABNORMAL HIGH (ref 70–99)
Glucose-Capillary: 121 mg/dL — ABNORMAL HIGH (ref 70–99)

## 2018-09-27 LAB — PHOSPHORUS: Phosphorus: 3.5 mg/dL (ref 2.5–4.6)

## 2018-09-27 LAB — APTT: aPTT: 36 seconds (ref 24–36)

## 2018-09-27 LAB — PROTIME-INR
INR: 1.3
PROTHROMBIN TIME: 16.1 s — AB (ref 11.4–15.2)

## 2018-09-27 LAB — PLATELET COUNT: Platelets: 80 10*3/uL — ABNORMAL LOW (ref 150–440)

## 2018-09-27 LAB — MAGNESIUM: Magnesium: 1.8 mg/dL (ref 1.7–2.4)

## 2018-09-27 LAB — HAPTOGLOBIN: HAPTOGLOBIN: 148 mg/dL (ref 34–200)

## 2018-09-27 MED ORDER — SODIUM CHLORIDE 0.9% IV SOLUTION: Freq: Once | INTRAVENOUS | Status: DC

## 2018-09-27 MED ORDER — SODIUM CHLORIDE 0.9% IV SOLUTION
Freq: Once | INTRAVENOUS | Status: AC
  Administered 2018-09-27: 11:00:00 via INTRAVENOUS

## 2018-09-27 MED ORDER — INSULIN ASPART 100 UNIT/ML ~~LOC~~ SOLN: 0.0000 [IU] | Freq: Three times a day (TID) | SUBCUTANEOUS | Status: DC

## 2018-09-27 MED ORDER — POTASSIUM CHLORIDE CRYS ER 20 MEQ PO TBCR
40.0000 meq | EXTENDED_RELEASE_TABLET | Freq: Once | ORAL | Status: AC
  Administered 2018-09-27: 40 meq via ORAL
  Filled 2018-09-27: qty 2

## 2018-09-27 NOTE — Progress Notes (Signed)
Pharmacy Electrolyte Monitoring Consult:  Pharmacy consulted to assist in monitoring and replacing electrolytes in this 63 y.o. male admitted on 09/24/2018 with Code Sepsis   Labs:  Sodium (mmol/L)  Date Value  09/27/2018 147 (H)   Potassium (mmol/L)  Date Value  09/27/2018 3.4 (L)   Magnesium (mg/dL)  Date Value  09/27/2018 1.8   Phosphorus (mg/dL)  Date Value  09/27/2018 3.5   Calcium (mg/dL)  Date Value  09/27/2018 7.2 (L)   Albumin (g/dL)  Date Value  09/24/2018 2.4 (L)    Assessment/Plan: Will give potassium 37mEq PO x1.   Will order electrolytes with morning labs.  Pharmacy to follow patient and replace electrolytes per consult.  Ocie Doyne, PharmD Candidate  09/27/2018 11:22 AM

## 2018-09-27 NOTE — Progress Notes (Signed)
[facility]  ROUNDING NOTE   Subjective:  Sodium down to 146 today. Also creatinine is trending down and currently 3.02. Good urine output noted.   Objective:  Vital signs in last 24 hours:  Temp:  [97.3 F (36.3 C)-98.1 F (36.7 C)] 97.8 F (36.6 C) (10/03 0800) Pulse Rate:  [65-100] 82 (10/03 0900) Resp:  [12-28] 17 (10/03 0900) BP: (78-135)/(52-95) 90/64 (10/03 0900) SpO2:  [84 %-100 %] 98 % (10/03 0900) Weight:  [65.5 kg] 65.5 kg (10/03 0453)  Weight change: 6.3 kg Filed Weights   09/24/18 1900 09/26/18 0354 09/27/18 0453  Weight: 52.8 kg 59.2 kg 65.5 kg    Intake/Output: I/O last 3 completed shifts: In: 528413.2 [P.O.:118; I.V.:2683.5; Blood:702.8; Other:129000; IV Piggyback:800] Out: 440102 [Urine:126415]   Intake/Output this shift:  Total I/O In: 241.7 [P.O.:118; I.V.:123.7] Out: -   Physical Exam: General: Critically ill appearing  Head: Normocephalic, atraumatic. Dry oral mucosal membranes  Eyes: Anicteric  Neck: Supple, trachea midline  Lungs:  Clear to auscultation, normal effort  Heart: S1S2 no rubs  Abdomen:  Soft, lower abdominal distension noted  Extremities: no peripheral edema.  Neurologic: Awake, alert, following commands  Skin: B/L UE ecchymoses       Basic Metabolic Panel: Recent Labs  Lab 09/25/18 0952 09/25/18 1800 09/25/18 2036  09/26/18 0814 09/26/18 1218 09/26/18 1659 09/26/18 1809 09/27/18 0447  NA 155* 151* 151*   < > 152* 150* 147* 148* 146*  K 3.8 3.4* 3.4*  --  3.0*  --   --  3.6 3.4*  CL 126* 125* 125*  --  125*  --   --   --  120*  CO2 18* 19* 20*  --  20*  --   --   --  20*  GLUCOSE 133* 177* 167*  --  144*  --   --   --  136*  BUN 134* 115* 114*  --  100*  --   --   --  79*  CREATININE 5.32* 4.24* 3.89*  --  3.72*  --   --   --  3.02*  CALCIUM 7.9* 7.4* 7.5*  --  7.6*  --   --   --  7.2*  MG  --   --   --   --  1.8  --   --   --  1.8  PHOS  --   --   --   --  3.6  --   --   --  3.5   < > = values  in this interval not displayed.    Liver Function Tests: Recent Labs  Lab 09/24/18 1454 09/24/18 1931  AST 48* 63*  ALT 11 17  ALKPHOS 179* 155*  BILITOT 1.9* 1.8*  PROT 6.7 6.4*  ALBUMIN 2.5* 2.4*   No results for input(s): LIPASE, AMYLASE in the last 168 hours. Recent Labs  Lab 09/24/18 1931  AMMONIA 42*    CBC: Recent Labs  Lab 09/24/18 1454 09/24/18 1931  09/25/18 0442  09/25/18 1800 09/26/18 0249 09/26/18 0814 09/26/18 1809 09/27/18 0447  WBC 9.9 5.6  --  8.2  --   --  6.1 7.5 5.0 4.6  NEUTROABS 7.6* 4.5  --   --   --   --   --  5.7 3.8  --   HGB 3.8* 7.1*   < > 6.8*   < > 7.8* 7.2* 6.7* 7.6* 6.3*  HCT 12.7* 21.7*   < > 19.9*   < >  22.2* 20.7* 19.2* 22.0* 17.8*  MCV 89.9 83.5  --  81.7  --   --  84.1 82.9 81.8 79.9*  PLT 68* 38*  --  81*  --   --  42* 68* 37* 57*   < > = values in this interval not displayed.    Cardiac Enzymes: Recent Labs  Lab 09/24/18 1454 09/25/18 0442  CKTOTAL 73 43*  TROPONINI 0.05*  --     BNP: Invalid input(s): POCBNP  CBG: Recent Labs  Lab 09/26/18 1620 09/26/18 1935 09/26/18 2331 09/27/18 0428 09/27/18 0707  GLUCAP 150* 138* 89 118* 128*    Microbiology: Results for orders placed or performed during the hospital encounter of 09/24/18  Blood Culture (routine x 2)     Status: None (Preliminary result)   Collection Time: 09/24/18  2:54 PM  Result Value Ref Range Status   Specimen Description BLOOD RIGHT AC  Final   Special Requests   Final    BOTTLES DRAWN AEROBIC AND ANAEROBIC Blood Culture results may not be optimal due to an excessive volume of blood received in culture bottles   Culture   Final    NO GROWTH 3 DAYS Performed at [facility], [address], [place], [place] [address]    Report Status PENDING  Incomplete  Urine culture     Status: None   Collection Time: 09/24/18  2:55 PM  Result Value Ref Range Status   Specimen Description   Final    URINE, RANDOM Performed at [facility], [address] [place], [place] [address]    Special Requests   Final    NONE Performed at [facility], [address], [place], [place] [address]    Culture   Final    NO GROWTH Performed at [facility], [place] [address] [place], [place] [address]    Report Status 09/26/2018 FINAL  Final  MRSA PCR Screening     Status: None   Collection Time: 09/24/18  8:43 PM  Result Value Ref Range Status   MRSA by PCR NEGATIVE NEGATIVE Final    Comment:        The GeneXpert MRSA Assay (FDA approved for NASAL specimens only), is one component of a comprehensive MRSA colonization surveillance program. It is not intended to diagnose MRSA infection nor to guide or monitor treatment for MRSA infections. Performed at [facility], [address] [place], [place] [address]     Coagulation Studies: Recent Labs    09/24/18 1931 09/26/18 0353 09/27/18 0612  LABPROT 22.3* 17.7* 16.1*  INR 1.98 1.47 1.30    Urinalysis: Recent Labs    09/24/18 1455  COLORURINE AMBER*  LABSPEC 1.012  PHURINE 5.0  GLUCOSEU NEGATIVE  HGBUR SMALL*  BILIRUBINUR NEGATIVE  KETONESUR NEGATIVE  PROTEINUR NEGATIVE  NITRITE NEGATIVE  LEUKOCYTESUR MODERATE*      Imaging: Ct Chest Wo Contrast  Result Date: 09/25/2018 CLINICAL DATA:  Altered mental status, anemia, acute renal failure, thrombocytopenia. Widespread sclerotic osseous lesions, suspicious for metastatic disease. EXAM: CT CHEST WITHOUT CONTRAST TECHNIQUE: Multidetector CT imaging of the chest was performed following the standard protocol without IV contrast. COMPARISON:  Chest radiographs dated 09/24/2018. CTA chest dated 03/25/2016. FINDINGS: Cardiovascular: The heart is normal in size. No pericardial effusion. No evidence of thoracic aortic aneurysm. Atherosclerotic calcifications aortic root/arch. Three vessel coronary atherosclerosis. Mediastinum/Nodes: No suspicious mediastinal lymphadenopathy. Visualized  thyroid is unremarkable. Lungs/Pleura: Biapical pleural-parenchymal scarring. Small right pleural effusion, partially loculated. Small  left pleural effusion. No focal consolidation. No pleural effusion or pneumothorax. Upper Abdomen: Unchanged from recent CT, noting mild perihepatic ascites. Musculoskeletal: Widespread/diffuse sclerotic osseous metastases throughout the visualized axial and appendicular skeleton, suggestive of metastatic prostate cancer. IMPRESSION: Widespread/diffuse sclerotic osseous metastases throughout the visualized axial and appendicular skeleton, suggestive of metastatic prostate cancer. Small bilateral pleural effusions, partially loculated on the right. Mild perihepatic ascites, incompletely visualized. Aortic Atherosclerosis (ICD10-I70.0). Electronically Signed   By: Julian Hy M.D.   On: 09/25/2018 23:59     Medications:   . cefTRIAXone (ROCEPHIN)  IV 2 g (09/26/18 1651)  . dexmedetomidine (PRECEDEX) IV infusion Stopped (09/25/18 1145)  . dextrose 125 mL/hr at 09/27/18 0700  . norepinephrine (LEVOPHED) 16mg  / 222mL infusion Stopped (09/26/18 0451)   . sodium chloride   Intravenous Once  . sodium chloride   Intravenous Once  . sodium chloride   Intravenous Once  . sodium chloride   Intravenous Once  . docusate sodium  100 mg Oral BID  . feeding supplement (ENSURE ENLIVE)  237 mL Oral BID BM  . folic acid  1 mg Intravenous Daily  . insulin aspart  0-9 Units Subcutaneous Q4H  . lidocaine  1 application Urethral Once  . multivitamin with minerals  1 tablet Oral Daily  . [START ON 09/28/2018] pantoprazole  40 mg Intravenous Q12H  . sodium chloride flush  10-40 mL Intracatheter Q12H  . thiamine injection  100 mg Intravenous Daily  . vitamin C  500 mg Oral BID   LORazepam, morphine injection, ondansetron **OR** ondansetron (ZOFRAN) IV, opium-belladonna, sodium chloride flush  Assessment/ Plan:  62 y.o. male with a PMHx of COPD, tobacco abuse, who was admitted  to [facility] on 09/24/2018 for evaluation of altered mental status.  It appears that the patient has been residing at a local hotel for approximately 3 months and was abusing ETOH.  Found to have severe metabolic deranagements at admission.   1.  Acute renal failure.  2.  Hypernatremia. 3.  Urinary retention/bilateral hydronephrosis.  4.  Severe anemia.   5.  Metabolic/lactic acidosis.   Plan: Creatinine continues to trend down slowly.  Creatinine currently 3.02 with a BUN of 79.  Good urine output noted.  Therefore no acute indication for dialysis.  Continue D5W as sodium remains slightly high.  Urology is also following on the case given bilateral hydronephrosis.  Hemoglobin down to 6.3 and patient receiving blood transfusion this a.m.  Avoid nephrotoxins as possible.   LOS: 3 Miranda Garber 10/3/20199:19 AM

## 2018-09-27 NOTE — Progress Notes (Signed)
Urology Consult Follow Up  Subjective: Catheter clotted last night and required replacement by Dr. Erlene Quan.  Presently inflow moderate with pink effluent.  Anti-infectives: Anti-infectives (From admission, onward)   Start     Dose/Rate Route Frequency Ordered Stop   09/25/18 1800  cefTRIAXone (ROCEPHIN) 2 g in sodium chloride 0.9 % 100 mL IVPB     2 g 200 mL/hr over 30 Minutes Intravenous Daily-1800 09/25/18 0956 09/29/18 1759   09/25/18 0830  vancomycin (VANCOCIN) IVPB 1000 mg/200 mL premix  Status:  Discontinued     1,000 mg 200 mL/hr over 60 Minutes Intravenous every 72 hours 09/24/18 1938 09/25/18 0956   09/25/18 0000  ceFEPIme (MAXIPIME) 1 g in sodium chloride 0.9 % 100 mL IVPB  Status:  Discontinued     1 g 200 mL/hr over 30 Minutes Intravenous Every 24 hours 09/24/18 1731 09/24/18 2018   09/24/18 2100  cefTRIAXone (ROCEPHIN) 2 g in sodium chloride 0.9 % 100 mL IVPB  Status:  Discontinued     2 g 200 mL/hr over 30 Minutes Intravenous Every 24 hours 09/24/18 2018 09/25/18 0956   09/24/18 1936  vancomycin variable dose per unstable renal function (pharmacist dosing)  Status:  Discontinued      Does not apply See admin instructions 09/24/18 1938 09/25/18 1037   09/24/18 1600  ceFEPIme (MAXIPIME) 2 g in sodium chloride 0.9 % 100 mL IVPB     2 g 200 mL/hr over 30 Minutes Intravenous  Once 09/24/18 1556 09/24/18 1735   09/24/18 1600  metroNIDAZOLE (FLAGYL) IVPB 500 mg  Status:  Discontinued     500 mg 100 mL/hr over 60 Minutes Intravenous Every 8 hours 09/24/18 1556 09/24/18 2018   09/24/18 1600  vancomycin (VANCOCIN) IVPB 1000 mg/200 mL premix     1,000 mg 200 mL/hr over 60 Minutes Intravenous  Once 09/24/18 1556 09/24/18 1904      Current Facility-Administered Medications  Medication Dose Route Frequency Provider Last Rate Last Dose  . 0.9 %  sodium chloride infusion (Manually program via Guardrails IV Fluids)   Intravenous Once Awilda Bill, NP      . 0.9 %  sodium  chloride infusion (Manually program via Guardrails IV Fluids)   Intravenous Once Awilda Bill, NP      . 0.9 %  sodium chloride infusion (Manually program via Guardrails IV Fluids)   Intravenous Once Darel Hong D, NP      . 0.9 %  sodium chloride infusion (Manually program via Guardrails IV Fluids)   Intravenous Once Darel Hong D, NP      . cefTRIAXone (ROCEPHIN) 2 g in sodium chloride 0.9 % 100 mL IVPB  2 g Intravenous q1800 Flora Lipps, MD 200 mL/hr at 09/26/18 1651 2 g at 09/26/18 1651  . dexmedetomidine (PRECEDEX) 400 MCG/100ML (4 mcg/mL) infusion  0.4-1.2 mcg/kg/hr Intravenous Continuous Awilda Bill, NP   Stopped at 09/25/18 1145  . dextrose 5 % solution   Intravenous Continuous Lateef, Munsoor, MD 125 mL/hr at 09/27/18 0600    . docusate sodium (COLACE) capsule 100 mg  100 mg Oral BID Epifanio Lesches, MD      . feeding supplement (ENSURE ENLIVE) (ENSURE ENLIVE) liquid 237 mL  237 mL Oral BID BM Kasa, Kurian, MD      . folic acid injection 1 mg  1 mg Intravenous Daily Awilda Bill, NP   1 mg at 09/26/18 0748  . insulin aspart (novoLOG) injection 0-9 Units  0-9 Units Subcutaneous Q4H  Awilda Bill, NP   1 Units at 09/26/18 2035  . lidocaine (XYLOCAINE) 2 % jelly 1 application  1 application Urethral Once Awilda Bill, NP      . LORazepam (ATIVAN) injection 2-3 mg  2-3 mg Intravenous Q1H PRN Flora Lipps, MD      . morphine 2 MG/ML injection 2 mg  2 mg Intravenous Q2H PRN Flora Lipps, MD   2 mg at 09/26/18 1403  . multivitamin with minerals tablet 1 tablet  1 tablet Oral Daily Flora Lipps, MD   1 tablet at 09/26/18 1141  . norepinephrine (LEVOPHED) 16 mg in dextrose 5 % 250 mL (0.064 mg/mL) infusion  0-40 mcg/min Intravenous Titrated Awilda Bill, NP   Stopped at 09/26/18 0451  . ondansetron (ZOFRAN) tablet 4 mg  4 mg Oral Q6H PRN Epifanio Lesches, MD       Or  . ondansetron (ZOFRAN) injection 4 mg  4 mg Intravenous Q6H PRN Epifanio Lesches, MD       . opium-belladonna (B&O SUPPRETTES) 16.2-60 MG suppository 1 suppository  1 suppository Rectal Q8H PRN Stoioff, Ronda Fairly, MD      . Derrill Memo ON 09/28/2018] pantoprazole (PROTONIX) injection 40 mg  40 mg Intravenous Q12H Epifanio Lesches, MD      . sodium chloride flush (NS) 0.9 % injection 10-40 mL  10-40 mL Intracatheter Q12H Epifanio Lesches, MD   30 mL at 09/27/18 0050  . sodium chloride flush (NS) 0.9 % injection 10-40 mL  10-40 mL Intracatheter PRN Epifanio Lesches, MD      . thiamine (B-1) injection 100 mg  100 mg Intravenous Daily Awilda Bill, NP   100 mg at 09/26/18 0748  . vitamin C (ASCORBIC ACID) tablet 500 mg  500 mg Oral BID Flora Lipps, MD   500 mg at 09/26/18 1141     Objective: Vital signs in last 24 hours: Temp:  [97.3 F (36.3 C)-98.1 F (36.7 C)] 97.6 F (36.4 C) (10/03 0625) Pulse Rate:  [65-100] 82 (10/03 0625) Resp:  [12-28] 13 (10/03 0625) BP: (78-131)/(52-95) 102/72 (10/03 0625) SpO2:  [84 %-100 %] 95 % (10/03 0625) Weight:  [65.5 kg] 65.5 kg (10/03 0453)  Intake/Output from previous day: 10/02 0701 - 10/03 0700 In: 40102.7 [P.O.:118; I.V.:2444.4; Blood:702.8; IV Piggyback:700] Out: 25366 [Urine:56450] Intake/Output this shift: No intake/output data recorded.   Physical Exam  Lab Results:  Recent Labs    09/26/18 1809 09/27/18 0447  WBC 5.0 4.6  HGB 7.6* 6.3*  HCT 22.0* 17.8*  PLT 37* 57*   BMET Recent Labs    09/26/18 0814  09/26/18 1809 09/27/18 0447  NA 152*   < > 148* 146*  K 3.0*  --  3.6 3.4*  CL 125*  --   --  120*  CO2 20*  --   --  20*  GLUCOSE 144*  --   --  136*  BUN 100*  --   --  79*  CREATININE 3.72*  --   --  3.02*  CALCIUM 7.6*  --   --  7.2*   < > = values in this interval not displayed.   PT/INR Recent Labs    09/26/18 0353 09/27/18 0612  LABPROT 17.7* 16.1*  INR 1.47 1.30   ABG Recent Labs    09/24/18 1940  PHART 7.45  HCO3 16.0*    Studies/Results: Ct Chest Wo Contrast  Result  Date: 09/25/2018 CLINICAL DATA:  Altered mental status, anemia, acute renal failure, thrombocytopenia.  Widespread sclerotic osseous lesions, suspicious for metastatic disease. EXAM: CT CHEST WITHOUT CONTRAST TECHNIQUE: Multidetector CT imaging of the chest was performed following the standard protocol without IV contrast. COMPARISON:  Chest radiographs dated 09/24/2018. CTA chest dated 03/25/2016. FINDINGS: Cardiovascular: The heart is normal in size. No pericardial effusion. No evidence of thoracic aortic aneurysm. Atherosclerotic calcifications aortic root/arch. Three vessel coronary atherosclerosis. Mediastinum/Nodes: No suspicious mediastinal lymphadenopathy. Visualized thyroid is unremarkable. Lungs/Pleura: Biapical pleural-parenchymal scarring. Small right pleural effusion, partially loculated. Small left pleural effusion. No focal consolidation. No pleural effusion or pneumothorax. Upper Abdomen: Unchanged from recent CT, noting mild perihepatic ascites. Musculoskeletal: Widespread/diffuse sclerotic osseous metastases throughout the visualized axial and appendicular skeleton, suggestive of metastatic prostate cancer. IMPRESSION: Widespread/diffuse sclerotic osseous metastases throughout the visualized axial and appendicular skeleton, suggestive of metastatic prostate cancer. Small bilateral pleural effusions, partially loculated on the right. Mild perihepatic ascites, incompletely visualized. Aortic Atherosclerosis (ICD10-I70.0). Electronically Signed   By: Julian Hy M.D.   On: 09/25/2018 23:59     Assessment: s/p Procedure(s): ESOPHAGOGASTRODUODENOSCOPY (EGD) 1.  Gross hematuria-persistent 2.  CT findings suspicious for metastatic prostate cancer.  His PSA is significantly elevated however this was drawn after catheterization and he had significant pyuria.  Urine culture was negative.  Creatinine is improving.  Plan: If his thrombocytopenia can be corrected and medically cleared for  anesthesia would recommend cystoscopy with fulguration; bilateral retrograde pyelograms and prostate biopsy.      LOS: 3 days    Abbie Sons 09/27/2018

## 2018-09-27 NOTE — Consult Note (Signed)
[facility] Face-to-Face Psychiatry Consult   Reason for Consult: Consult to follow-up with this 62 year old man brought into the hospital with malnutrition and altered mental status and now found to probably have extensive metastatic disease.  Specific question today about capacity Referring Physician: Anselm Jungling Patient Identification: LEDARIUS LEESON MRN:  [id number] Principal Diagnosis: Alcohol abuse Diagnosis:   Patient Active Problem List   Diagnosis Date Noted  . Protein-calorie malnutrition, severe [E43] 09/27/2018  . Acute metabolic encephalopathy [U93.23]   . Bladder outlet obstruction [N32.0]   . Shock ([place]) [R57.9]   . Palliative care encounter [Z51.5]   . COPD (chronic obstructive pulmonary disease) ([place]) [J44.9] 09/25/2018  . Alcohol abuse [F10.10] 09/25/2018  . Acute anemia [D64.9] 09/24/2018  . Shortness of breath [R06.02] 03/25/2016    Total Time spent with patient: 30 minutes  Subjective:   ANGELA PLATNER is a 62 y.o. male patient admitted with "just about everything".  HPI: Patient seen chart reviewed.  Spoke with nursing.  Reviewed palliative care note.  62 year old man was brought into the hospital after being found unconscious at a motel room.  Patient was cooperative with interview today.  I explained to him that I needed to ask some questions to find out how clearly he was thinking.  Patient did not have any chief complaint.  Denied being in any pain or physical distress right now.  When asked where we were right now the patient said that we were in the [facility].  When I ask him directly if that is where we were he was a little unsure about it but was not able to tell me we were in the hospital.  He did know the correct year.  Patient had no memory of how he came to be here in the hospital and no understanding of what was going on.  He told me that no one today had spoken with him about any medical problems.  In fact he told me he did not believe he had any medical problems  at all.  I asked him whether anyone had mentioned anything about cancer to him today.  Patient responded "cancer is in every 30-minute problem for me, in other words it is no big deal."  He made several other nonsensical statements like this.  Clearly had no memory of anyone talking about any specific medical issues.  I tested his short-term memory and he was very poor and not able to remember anything after only a couple minutes.  When I first came into the room he was having an animated conversation with the Education officer, museum.  Just 5 minutes later I ask him if he remembered the person he had been speaking to before I came in and if he could describe her to me and he had no idea what I was talking about.  Past Psychiatric History: History of identified alcohol abuse.  No other mental health history  Risk to Self:   Risk to Others:   Prior Inpatient Therapy:   Prior Outpatient Therapy:    Past Medical History:  Past Medical History:  Diagnosis Date  . COPD (chronic obstructive pulmonary disease) ([address])   . Tobacco abuse    History reviewed. No pertinent surgical history. Family History:  Family History  Problem Relation Age of Onset  . Hypertension Neg Hx   . Diabetes Neg Hx    Family Psychiatric  History: The previous note Social History:  Social History   Substance and Sexual Activity  Alcohol Use Yes  .  Alcohol/week: 0.0 standard drinks   Comment: 2-3 times per week     Social History   Substance and Sexual Activity  Drug Use No    Social History   Socioeconomic History  . Marital status: Widowed    Spouse name: Not on file  . Number of children: Not on file  . Years of education: Not on file  . Highest education level: Not on file  Occupational History  . Not on file  Social Needs  . Financial resource strain: Not on file  . Food insecurity:    Worry: Not on file    Inability: Not on file  . Transportation needs:    Medical: Not on file    Non-medical: Not on file   Tobacco Use  . Smoking status: Current Every Day Smoker    Packs/day: 1.00    Years: 35.00    Pack years: 35.00    Types: Cigarettes  . Smokeless tobacco: Never Used  Substance and Sexual Activity  . Alcohol use: Yes    Alcohol/week: 0.0 standard drinks    Comment: 2-3 times per week  . Drug use: No  . Sexual activity: Not Currently  Lifestyle  . Physical activity:    Days per week: Not on file    Minutes per session: Not on file  . Stress: Not on file  Relationships  . Social connections:    Talks on phone: Not on file    Gets together: Not on file    Attends religious service: Not on file    Active member of club or organization: Not on file    Attends meetings of clubs or organizations: Not on file    Relationship status: Not on file  Other Topics Concern  . Not on file  Social History Narrative  . Not on file   Additional Social History:    Allergies:  No Known Allergies  Labs:  Results for orders placed or performed during the hospital encounter of 09/24/18 (from the past 48 hour(s))  Glucose, capillary     Status: Abnormal   Collection Time: 09/25/18  7:49 PM  Result Value Ref Range   Glucose-Capillary 145 (H) 70 - 99 mg/dL  Vitamin B12     Status: None   Collection Time: 09/25/18  8:00 PM  Result Value Ref Range   Vitamin B-12 460 180 - 914 pg/mL    Comment: (NOTE) This assay is not validated for testing neonatal or myeloproliferative syndrome specimens for Vitamin B12 levels. Performed at [facility], [address], [place], [place] [address]   Basic metabolic panel     Status: Abnormal   Collection Time: 09/25/18  8:36 PM  Result Value Ref Range   Sodium 151 (H) 135 - 145 mmol/L   Potassium 3.4 (L) 3.5 - 5.1 mmol/L   Chloride 125 (H) 98 - 111 mmol/L   CO2 20 (L) 22 - 32 mmol/L   Glucose, Bld 167 (H) 70 - 99 mg/dL   BUN 114 (H) 8 - 23 mg/dL    Comment: RESULT CONFIRMED BY MANUAL DILUTION AKT   Creatinine, Ser 3.89 (H) 0.61 - 1.24 mg/dL    Calcium 7.5 (L) 8.9 - 10.3 mg/dL   GFR calc non Af Amer 15 (L) >60 mL/min   GFR calc Af Amer 18 (L) >60 mL/min    Comment: (NOTE) The eGFR has been calculated using the CKD EPI equation. This calculation has not been validated in all clinical situations. eGFR's  persistently <60 mL/min signify possible Chronic Kidney Disease.    Anion gap 6 5 - 15    Comment: Performed at [facility], [address] [place], [place] [address]  Lactate dehydrogenase     Status: None   Collection Time: 09/25/18  8:36 PM  Result Value Ref Range   LDH 126 98 - 192 U/L    Comment: Performed at [facility], [address] [place], [place] [address]  Haptoglobin     Status: None   Collection Time: 09/25/18  8:36 PM  Result Value Ref Range   Haptoglobin 148 34 - 200 mg/dL    Comment: (NOTE) Performed At: [facility] [place], [place] [id number] Rush Farmer MD IW:[id number]   Ferritin     Status: Abnormal   Collection Time: 09/25/18  8:36 PM  Result Value Ref Range   Ferritin 991 (H) 24 - 336 ng/mL    Comment: Performed at [facility], [address] [place], [place] [address]  Iron and TIBC     Status: Abnormal   Collection Time: 09/25/18  8:36 PM  Result Value Ref Range   Iron 109 45 - 182 ug/dL   TIBC 116 (L) 250 - 450 ug/dL   Saturation Ratios 94 (H) 17.9 - 39.5 %   UIBC 7 ug/dL    Comment: Performed at [facility], [address] [place], [place] [address]  Folate     Status: None   Collection Time: 09/25/18  8:36 PM  Result Value Ref Range   Folate 71.7 >5.9 ng/mL    Comment: RESULT CONFIRMED BY MANUAL DILUTION AKT Performed at [facility], [address] [place], [place] [address]   Lactic acid, plasma     Status: None   Collection Time: 09/25/18  9:45 PM  Result Value Ref Range   Lactic Acid, Venous 1.8 0.5 - 1.9 mmol/L    Comment: Performed at [facility], [address]  [place], [place] [address]  Glucose, capillary     Status: None   Collection Time: 09/25/18 11:44 PM  Result Value Ref Range   Glucose-Capillary 89 70 - 99 mg/dL  Sodium     Status: Abnormal   Collection Time: 09/26/18 12:30 AM  Result Value Ref Range   Sodium 152 (H) 135 - 145 mmol/L    Comment: Performed at [facility], [address] [place], [place] [address]  Glucose, capillary     Status: None   Collection Time: 09/26/18  2:43 AM  Result Value Ref Range   Glucose-Capillary 95 70 - 99 mg/dL  Procalcitonin     Status: None   Collection Time: 09/26/18  2:49 AM  Result Value Ref Range   Procalcitonin 0.90 ng/mL    Comment:        Interpretation: PCT > 0.5 ng/mL and <= 2 ng/mL: Systemic infection (sepsis) is possible, but other conditions are known to elevate PCT as well. (NOTE)       Sepsis PCT Algorithm           Lower Respiratory Tract                                      Infection PCT Algorithm    ----------------------------     ----------------------------         PCT < 0.25 ng/mL  PCT < 0.10 ng/mL         Strongly encourage             Strongly discourage   discontinuation of antibiotics    initiation of antibiotics    ----------------------------     -----------------------------       PCT 0.25 - 0.50 ng/mL            PCT 0.10 - 0.25 ng/mL               OR       >80% decrease in PCT            Discourage initiation of                                            antibiotics      Encourage discontinuation           of antibiotics    ----------------------------     -----------------------------         PCT >= 0.50 ng/mL              PCT 0.26 - 0.50 ng/mL                AND       <80% decrease in PCT             Encourage initiation of                                             antibiotics       Encourage continuation           of antibiotics    ----------------------------     -----------------------------        PCT >= 0.50 ng/mL                   PCT > 0.50 ng/mL               AND         increase in PCT                  Strongly encourage                                      initiation of antibiotics    Strongly encourage escalation           of antibiotics                                     -----------------------------                                           PCT <= 0.25 ng/mL                                                 OR                                        >  80% decrease in PCT                                     Discontinue / Do not initiate                                             antibiotics Performed at [facility], [address] [place], [place] [address]   CBC     Status: Abnormal   Collection Time: 09/26/18  2:49 AM  Result Value Ref Range   WBC 6.1 3.8 - 10.6 K/uL   RBC 2.46 (L) 4.40 - 5.90 MIL/uL   Hemoglobin 7.2 (L) 13.0 - 18.0 g/dL   HCT 20.7 (L) 40.0 - 52.0 %   MCV 84.1 80.0 - 100.0 fL   MCH 29.0 26.0 - 34.0 pg   MCHC 34.5 32.0 - 36.0 g/dL   RDW 17.6 (H) 11.5 - 14.5 %   Platelets 42 (L) 150 - 440 K/uL    Comment: Performed at [facility], [address] [place], [place] [address]  Sodium     Status: Abnormal   Collection Time: 09/26/18  2:49 AM  Result Value Ref Range   Sodium 153 (H) 135 - 145 mmol/L    Comment: Performed at [facility], [address] [place], [place] [address]  Protime-INR     Status: Abnormal   Collection Time: 09/26/18  3:53 AM  Result Value Ref Range   Prothrombin Time 17.7 (H) 11.4 - 15.2 seconds   INR 1.47     Comment: Performed at [facility], [address] [place], [place] [address]  APTT     Status: None   Collection Time: 09/26/18  3:53 AM  Result Value Ref Range   aPTT 34 24 - 36 seconds    Comment: Performed at [facility], [address] [place], [place] [address]  Glucose, capillary     Status: Abnormal   Collection Time: 09/26/18  4:00 AM  Result Value Ref Range   Glucose-Capillary 108  (H) 70 - 99 mg/dL  Sodium     Status: Abnormal   Collection Time: 09/26/18  4:05 AM  Result Value Ref Range   Sodium 152 (H) 135 - 145 mmol/L    Comment: Performed at [facility], [address] [place], [place] [address]  Sodium     Status: Abnormal   Collection Time: 09/26/18  5:26 AM  Result Value Ref Range   Sodium 152 (H) 135 - 145 mmol/L    Comment: Performed at [facility], [address] [place],  [address]  Glucose, capillary     Status: Abnormal   Collection Time: 09/26/18  7:14 AM  Result Value Ref Range   Glucose-Capillary 119 (H) 70 - 99 mg/dL  Basic metabolic panel     Status: Abnormal   Collection Time: 09/26/18  8:14 AM  Result Value Ref Range   Sodium 152 (H) 135 - 145 mmol/L   Potassium 3.0 (L) 3.5 - 5.1 mmol/L   Chloride 125 (H) 98 - 111 mmol/L   CO2 20 (L) 22 - 32 mmol/L   Glucose, Bld 144 (H) 70 - 99 mg/dL   BUN 100 (H) 8 - 23 mg/dL   Creatinine, Ser 3.72 (H) 0.61 -  1.24 mg/dL   Calcium 7.6 (L) 8.9 - 10.3 mg/dL   GFR calc non Af Amer 16 (L) >60 mL/min   GFR calc Af Amer 19 (L) >60 mL/min    Comment: (NOTE) The eGFR has been calculated using the CKD EPI equation. This calculation has not been validated in all clinical situations. eGFR's persistently <60 mL/min signify possible Chronic Kidney Disease.    Anion gap 7 5 - 15    Comment: Performed at [facility], [address] [place], [place] [address]  CBC with Differential/Platelet     Status: Abnormal   Collection Time: 09/26/18  8:14 AM  Result Value Ref Range   WBC 7.5 3.8 - 10.6 K/uL   RBC 2.32 (L) 4.40 - 5.90 MIL/uL   Hemoglobin 6.7 (L) 13.0 - 18.0 g/dL   HCT 19.2 (L) 40.0 - 52.0 %   MCV 82.9 80.0 - 100.0 fL   MCH 28.9 26.0 - 34.0 pg   MCHC 34.8 32.0 - 36.0 g/dL   RDW 17.8 (H) 11.5 - 14.5 %   Platelets 68 (L) 150 - 440 K/uL   Neutrophils Relative % 75 %   Neutro Abs 5.7 1.4 - 6.5 K/uL   Lymphocytes Relative 21 %   Lymphs Abs 1.6 1.0 - 3.6 K/uL    Monocytes Relative 3 %   Monocytes Absolute 0.2 0.2 - 1.0 K/uL   Eosinophils Relative 1 %   Eosinophils Absolute 0.0 0 - 0.7 K/uL   Basophils Relative 0 %   Basophils Absolute 0.0 0 - 0.1 K/uL    Comment: Performed at [facility], [address] [place], [place] [address]  Magnesium     Status: None   Collection Time: 09/26/18  8:14 AM  Result Value Ref Range   Magnesium 1.8 1.7 - 2.4 mg/dL    Comment: Performed at [facility], [address], [place], [place] [address]  Phosphorus     Status: None   Collection Time: 09/26/18  8:14 AM  Result Value Ref Range   Phosphorus 3.6 2.5 - 4.6 mg/dL    Comment: Performed at [facility] [place], [address] [place], [place] [address]  Prepare RBC     Status: None   Collection Time: 09/26/18 10:19 AM  Result Value Ref Range   Order Confirmation      ORDER PROCESSED BY BLOOD BANK Performed at [facility], [address] [place], [place] [address]   Glucose, capillary     Status: Abnormal   Collection Time: 09/26/18 11:32 AM  Result Value Ref Range   Glucose-Capillary 145 (H) 70 - 99 mg/dL  Sodium     Status: Abnormal   Collection Time: 09/26/18 12:18 PM  Result Value Ref Range   Sodium 150 (H) 135 - 145 mmol/L    Comment: Performed at [facility], [address] [place], [place] [address]  Glucose, capillary     Status: Abnormal   Collection Time: 09/26/18  4:20 PM  Result Value Ref Range   Glucose-Capillary 150 (H) 70 - 99 mg/dL  Sodium     Status: Abnormal   Collection Time: 09/26/18  4:59 PM  Result Value Ref Range   Sodium 147 (H) 135 - 145 mmol/L    Comment: Performed at [facility], [address] [place], [place] [address]  Sodium     Status: Abnormal   Collection Time: 09/26/18  6:09 PM  Result Value Ref Range   Sodium 148 (H)  135 - 145 mmol/L    Comment: Performed at [facility], [address] [place], [place] [address]  Potassium     Status:  None   Collection Time: 09/26/18  6:09 PM  Result Value Ref Range   Potassium 3.6 3.5 - 5.1 mmol/L    Comment: Performed at [facility], [address] [place], [place] [address]  CBC with Differential/Platelet     Status: Abnormal   Collection Time: 09/26/18  6:09 PM  Result Value Ref Range   WBC 5.0 3.8 - 10.6 K/uL   RBC 2.69 (L) 4.40 - 5.90 MIL/uL   Hemoglobin 7.6 (L) 13.0 - 18.0 g/dL   HCT 22.0 (L) 40.0 - 52.0 %   MCV 81.8 80.0 - 100.0 fL   MCH 28.4 26.0 - 34.0 pg   MCHC 34.7 32.0 - 36.0 g/dL   RDW 18.2 (H) 11.5 - 14.5 %   Platelets 37 (L) 150 - 440 K/uL   Neutrophils Relative % 78 %   Lymphocytes Relative 18 %   Monocytes Relative 2 %   Eosinophils Relative 1 %   Basophils Relative 1 %   Neutro Abs 3.8 1.4 - 6.5 K/uL   Lymphs Abs 0.9 (L) 1.0 - 3.6 K/uL   Monocytes Absolute 0.1 (L) 0.2 - 1.0 K/uL   Eosinophils Absolute 0.1 0 - 0.7 K/uL   Basophils Absolute 0.1 0 - 0.1 K/uL    Comment: Performed at [facility], [address] [place], [place] [address]  Glucose, capillary     Status: Abnormal   Collection Time: 09/26/18  7:35 PM  Result Value Ref Range   Glucose-Capillary 138 (H) 70 - 99 mg/dL  Prepare Pheresed Platelets     Status: None (Preliminary result)   Collection Time: 09/26/18  8:30 PM  Result Value Ref Range   Unit Number [id number]    Blood Component Type PLTPH LI2 PAS    Unit division 00    Status of Unit ISSUED    Transfusion Status      OK TO TRANSFUSE Performed at [facility], [address] [place], [place] [address]   Glucose, capillary     Status: None   Collection Time: 09/26/18 11:31 PM  Result Value Ref Range   Glucose-Capillary 89 70 - 99 mg/dL  Glucose, capillary     Status: Abnormal   Collection Time: 09/27/18  4:28 AM  Result Value Ref Range   Glucose-Capillary 118 (H) 70 - 99 mg/dL  Basic metabolic panel     Status: Abnormal   Collection Time: 09/27/18  4:47 AM  Result Value Ref Range   Sodium  146 (H) 135 - 145 mmol/L   Potassium 3.4 (L) 3.5 - 5.1 mmol/L   Chloride 120 (H) 98 - 111 mmol/L   CO2 20 (L) 22 - 32 mmol/L   Glucose, Bld 136 (H) 70 - 99 mg/dL   BUN 79 (H) 8 - 23 mg/dL   Creatinine, Ser 3.02 (H) 0.61 - 1.24 mg/dL   Calcium 7.2 (L) 8.9 - 10.3 mg/dL   GFR calc non Af Amer 21 (L) >60 mL/min   GFR calc Af Amer 24 (L) >60 mL/min    Comment: (NOTE) The eGFR has been calculated using the CKD EPI equation. This calculation has not been validated in all clinical situations. eGFR's persistently <60 mL/min signify possible Chronic Kidney Disease.    Anion gap 6 5 - 15    Comment: Performed at [facility],  [place], [place] [address]  Magnesium     Status: None   Collection Time: 09/27/18  4:47 AM  Result Value Ref Range   Magnesium 1.8 1.7 - 2.4 mg/dL    Comment: Performed at [facility], [address] [place], [place] [address]  Phosphorus     Status: None   Collection Time: 09/27/18  4:47 AM  Result Value Ref Range   Phosphorus 3.5 2.5 - 4.6 mg/dL    Comment: Performed at [facility], [address] [place], [place] [address]  CBC     Status: Abnormal   Collection Time: 09/27/18  4:47 AM  Result Value Ref Range   WBC 4.6 3.8 - 10.6 K/uL   RBC 2.23 (L) 4.40 - 5.90 MIL/uL   Hemoglobin 6.3 (L) 13.0 - 18.0 g/dL   HCT 17.8 (L) 40.0 - 52.0 %   MCV 79.9 (L) 80.0 - 100.0 fL   MCH 28.1 26.0 - 34.0 pg   MCHC 35.2 32.0 - 36.0 g/dL   RDW 18.3 (H) 11.5 - 14.5 %   Platelets 57 (L) 150 - 440 K/uL    Comment: Performed at [facility], [address] [place], [place] [address]  Prepare RBC     Status: None   Collection Time: 09/27/18  5:31 AM  Result Value Ref Range   Order Confirmation      ORDER PROCESSED BY BLOOD BANK Performed at [facility], [address] [place], [place] [address]   Protime-INR     Status: Abnormal   Collection Time: 09/27/18  6:12 AM  Result Value Ref Range   Prothrombin  Time 16.1 (H) 11.4 - 15.2 seconds   INR 1.30     Comment: Performed at [facility], [address] [place], [place] [address]  APTT     Status: None   Collection Time: 09/27/18  6:12 AM  Result Value Ref Range   aPTT 36 24 - 36 seconds    Comment: Performed at [facility], [address] [place], [place] [address]  Glucose, capillary     Status: Abnormal   Collection Time: 09/27/18  7:07 AM  Result Value Ref Range   Glucose-Capillary 128 (H) 70 - 99 mg/dL  Sodium     Status: Abnormal   Collection Time: 09/27/18 10:04 AM  Result Value Ref Range   Sodium 147 (H) 135 - 145 mmol/L    Comment: Performed at [facility], [place],  [address]  CBC with Differential/Platelet     Status: Abnormal   Collection Time: 09/27/18 10:04 AM  Result Value Ref Range   WBC 5.0 3.8 - 10.6 K/uL   RBC 2.56 (L) 4.40 - 5.90 MIL/uL   Hemoglobin 7.5 (L) 13.0 - 18.0 g/dL   HCT 21.1 (L) 40.0 - 52.0 %   MCV 82.5 80.0 - 100.0 fL   MCH 29.4 26.0 - 34.0 pg   MCHC 35.6 32.0 - 36.0 g/dL   RDW 18.7 (H) 11.5 - 14.5 %   Platelets 54 (L) 150 - 440 K/uL   Neutrophils Relative % 79 %   Lymphocytes Relative 17 %   Monocytes Relative 2 %   Eosinophils Relative 1 %   Basophils Relative 0 %   Band Neutrophils 1 %   Metamyelocytes Relative 0 %   Myelocytes 0 %   Promyelocytes Relative 0 %   Blasts 0 %   nRBC 1 (H) 0 /100  WBC   Other 0 %   Neutro Abs 3.9 1.4 - 6.5 K/uL   Lymphs Abs 0.9 (L) 1.0 - 3.6 K/uL   Monocytes Absolute 0.1 (L) 0.2 - 1.0 K/uL   Eosinophils Absolute 0.1 0 - 0.7 K/uL   Basophils Absolute 0.0 0 - 0.1 K/uL   RBC Morphology POLYCHROMASIA PRESENT     Comment: MIXED RBC POPULATION Performed at [facility], [address] [place], [place] [address]   Prepare Pheresed Platelets     Status: None (Preliminary result)   Collection Time: 09/27/18 10:50 AM  Result Value Ref Range   Unit Number [id number]    Blood Component Type  PLTP LR1 PAS    Unit division 00    Status of Unit ISSUED    Transfusion Status OK TO TRANSFUSE   Glucose, capillary     Status: Abnormal   Collection Time: 09/27/18 12:11 PM  Result Value Ref Range   Glucose-Capillary 127 (H) 70 - 99 mg/dL  Sodium     Status: None   Collection Time: 09/27/18  2:15 PM  Result Value Ref Range   Sodium 145 135 - 145 mmol/L    Comment: Performed at [facility], [address] [place], [place] [address]  Platelet count     Status: Abnormal   Collection Time: 09/27/18  2:15 PM  Result Value Ref Range   Platelets 80 (L) 150 - 440 K/uL    Comment: Performed at [facility], [address] [place], [place] [address]  Glucose, capillary     Status: Abnormal   Collection Time: 09/27/18  4:42 PM  Result Value Ref Range   Glucose-Capillary 113 (H) 70 - 99 mg/dL    Current Facility-Administered Medications  Medication Dose Route Frequency Provider Last Rate Last Dose  . 0.9 %  sodium chloride infusion (Manually program via Guardrails IV Fluids)   Intravenous Once Awilda Bill, NP      . 0.9 %  sodium chloride infusion (Manually program via Guardrails IV Fluids)   Intravenous Once Awilda Bill, NP      . 0.9 %  sodium chloride infusion (Manually program via Guardrails IV Fluids)   Intravenous Once Darel Hong D, NP      . 0.9 %  sodium chloride infusion (Manually program via Guardrails IV Fluids)   Intravenous Once Darel Hong D, NP      . cefTRIAXone (ROCEPHIN) 2 g in sodium chloride 0.9 % 100 mL IVPB  2 g Intravenous q1800 Flora Lipps, MD 200 mL/hr at 09/27/18 1655 2 g at 09/27/18 1655  . dexmedetomidine (PRECEDEX) 400 MCG/100ML (4 mcg/mL) infusion  0.4-1.2 mcg/kg/hr Intravenous Continuous Awilda Bill, NP   Stopped at 09/25/18 1145  . dextrose 5 % solution   Intravenous Continuous Holley Raring, [place], MD 125 mL/hr at 09/27/18 1654    . docusate sodium (COLACE) capsule 100 mg  100 mg Oral BID Epifanio Lesches, MD   100  mg at 09/27/18 0750  . feeding supplement (ENSURE ENLIVE) (ENSURE ENLIVE) liquid 237 mL  237 mL Oral BID BM Flora Lipps, MD   237 mL at 09/27/18 0911  . folic acid injection 1 mg  1 mg Intravenous Daily Awilda Bill, NP   1 mg at 09/27/18 0751  . insulin aspart (novoLOG) injection 0-9 Units  0-9 Units Subcutaneous TID WC Kasa, Kurian, MD      . lidocaine (XYLOCAINE) 2 % jelly 1 application  1 application Urethral Once [place],  Dreama Saa, NP      . LORazepam (ATIVAN) injection 2-3 mg  2-3 mg Intravenous Q1H PRN Flora Lipps, MD      . morphine 2 MG/ML injection 2 mg  2 mg Intravenous Q2H PRN Flora Lipps, MD   2 mg at 09/26/18 1403  . multivitamin with minerals tablet 1 tablet  1 tablet Oral Daily Flora Lipps, MD   1 tablet at 09/27/18 0750  . ondansetron (ZOFRAN) tablet 4 mg  4 mg Oral Q6H PRN Epifanio Lesches, MD       Or  . ondansetron (ZOFRAN) injection 4 mg  4 mg Intravenous Q6H PRN Epifanio Lesches, MD      . opium-belladonna (B&O SUPPRETTES) 16.2-60 MG suppository 1 suppository  1 suppository Rectal Q8H PRN Stoioff, Ronda Fairly, MD      . Derrill Memo ON 09/28/2018] pantoprazole (PROTONIX) injection 40 mg  40 mg Intravenous Q12H Epifanio Lesches, MD      . sodium chloride flush (NS) 0.9 % injection 10-40 mL  10-40 mL Intracatheter Q12H Epifanio Lesches, MD   10 mL at 09/27/18 0752  . sodium chloride flush (NS) 0.9 % injection 10-40 mL  10-40 mL Intracatheter PRN Epifanio Lesches, MD      . thiamine (B-1) injection 100 mg  100 mg Intravenous Daily Awilda Bill, NP   100 mg at 09/27/18 0750  . vitamin C (ASCORBIC ACID) tablet 500 mg  500 mg Oral BID Flora Lipps, MD   500 mg at 09/27/18 6384    Musculoskeletal: Strength & Muscle Tone: decreased Gait & Station: unable to stand Patient leans: N/A  Psychiatric Specialty Exam: Physical Exam  Nursing note and vitals reviewed. Constitutional: He appears well-developed.  HENT:  Head: Normocephalic and atraumatic.  Eyes:  Pupils are equal, round, and reactive to light. Conjunctivae are normal.  Neck: Normal range of motion.  Cardiovascular: Regular rhythm and normal heart sounds.  Respiratory: Effort normal. No respiratory distress.  GI: Soft.  Musculoskeletal: Normal range of motion.  Neurological: He is alert.  Skin: Skin is warm and dry.  Psychiatric: His affect is blunt. His speech is tangential. He is slowed. Thought content is not paranoid. Cognition and memory are impaired. He expresses inappropriate judgment. He expresses no homicidal and no suicidal ideation. He exhibits abnormal recent memory and abnormal remote memory.    Review of Systems  Constitutional: Negative.   HENT: Negative.   Eyes: Negative.   Respiratory: Negative.   Cardiovascular: Negative.   Gastrointestinal: Negative.   Musculoskeletal: Negative.   Skin: Negative.   Neurological: Negative.   Psychiatric/Behavioral: Negative.     Blood pressure 111/73, pulse 88, temperature 98.2 F (36.8 C), temperature source Oral, resp. rate 17, height 5' 7"  (1.702 m), weight 65.5 kg, SpO2 100 %.Body mass index is 22.62 kg/m.  General Appearance: Fairly Groomed  Eye Contact:  Fair  Speech:  Garbled and Slow  Volume:  Decreased  Mood:  Euthymic  Affect:  Constricted  Thought Process:  Disorganized  Orientation:  Negative  Thought Content:  Negative  Suicidal Thoughts:  No  Homicidal Thoughts:  No  Memory:  Negative  Judgement:  Negative  Insight:  Negative  Psychomotor Activity:  Decreased  Concentration:  Concentration: Poor  Recall:  Poor  Fund of Knowledge:  Fair  Language:  Fair  Akathisia:  Negative  Handed:  Right  AIMS (if indicated):     Assets:  Resilience  ADL's:  Impaired  Cognition:  Impaired,  Moderate  Sleep:  Treatment Plan Summary: Plan The good news is that the patient does not appear to be in any pain or any distress and seems fairly content with his current situation.  He is not getting agitated  and has even been able to eat a little bit today.  As far as capacity however the patient does not appear to have really any capacity to make any kind of complex decision.  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  I do not think he would be able to make decisions about medical treatments or about placement in his current condition.  No indication for any psychiatric medicine.  I will continue to follow up if needed.  Disposition: No evidence of imminent risk to self or others at present.   Patient does not meet criteria for psychiatric inpatient admission. Supportive therapy provided about ongoing stressors.  Alethia Berthold, MD 09/27/2018 6:29 PM

## 2018-09-27 NOTE — Progress Notes (Signed)
CBI foley emptied approx Q1-1.5 hrs this shift, 3000-3400 out each time.  Catheter observed clotted off at 1800.  Irrigated w/ sterile NS, about 240 cc total of clotted blood aspirated from catheter, CBI restarted and free flowing at this time

## 2018-09-27 NOTE — Progress Notes (Signed)
   CHIEF COMPLAINT:   Mental status changes   Subjective  No CP, SOB Off pressors +severe anemia Na down to 146  Dx c/w metastatic prostate cancer with bone mets  Continue bladder irrigation     Objective   VITALS: BP 90/64   Pulse 82   Temp 97.8 F (36.6 C) (Axillary)   Resp 17   Ht 5\' 7"  (1.702 m)   Wt 65.5 kg   SpO2 98%   BMI 22.62 kg/m    Review of Systems:  Gen:  Denies  fever, sweats, chills weigh loss  HEENT: Denies blurred vision, double vision, ear pain, eye pain, hearing loss, nose bleeds, sore throat Cardiac:  No dizziness, chest pain or heaviness, chest tightness,edema, No JVD Resp:   No SOB Other:  All other systems negative  Physical Examination:   GENERAL:NAD, no fevers, chills, no weakness no fatigue HEAD: Normocephalic, atraumatic.  EYES: Pupils equal, round, reactive to light. Extraocular muscles intact. No scleral icterus.  MOUTH: Moist mucosal membrane. Dentition intact. No abscess noted.  EAR, NOSE, THROAT: Clear without exudates. No external lesions.  NECK: Supple. No thyromegaly. No nodules. No JVD.  PULMONARY: CTA B/L no wheezing, rhonchi, crackles CARDIOVASCULAR: S1 and S2. Regular rate and rhythm. No murmurs, rubs, or gallops. No edema. Pedal pulses 2+ bilaterally.  GASTROINTESTINAL: Soft, nontender, nondistended. No masses. Positive bowel sounds. No hepatosplenomegaly.  MUSCULOSKELETAL: No swelling, clubbing, or edema. Range of motion full in all extremities.  NEUROLOGIC: Cranial nerves II through XII are intact. No gross focal neurological deficits. Sensation intact. Reflexes intact.  SKIN: No ulceration, lesions, rashes, or cyanosis. Skin warm and dry. Turgor intact.  PSYCHIATRIC: Mood, affect within normal limits. The patient is awake, alert and oriented x 3. Insight, judgment intact.  ALL OTHER ROS ARE NEGATIVE   I personally reviewed Labs under Results section.  Septic shock-resolved  Severe Anemia-transfuse as  needed  Wide spread Mets likely from prostate cancer Oncology and palliative care consulted  Psych consulted  ETOH abuse with cirrhosis Risk for DT's CIWA protocol  Tolerating Food intake    Will assess transfer to gen med floor  Payson Evrard Patricia Pesa, M.D.  [facility]  Medical Director [place] Director [facility] Cardio-Pulmonary Department

## 2018-09-27 NOTE — Progress Notes (Signed)
[place]  [phone] [phone]   Name: Joseph Trevino Date: 09/27/2018 MRN: [id number]  DOB: 12-06-1956  Patient Care Team: Patient, [name] as PCP - General (General Practice)    REASON FOR CONSULTATION: Palliative Care consult requested for this 62 y.o. male for goals of medical treatment in patient with multiple medical problems including COPD, tobacco and alcohol abuse, who was admitted on 9/30 with altered mental status after being found unresponsive in his hotel room, apparently surrounded by empty alcohol bottles and covered in stool.  He initially refused transport to the ER but was involuntarily committed.  Initial work-up revealed multiple metabolic derangements, acute renal failure, severe anemia with hematuria, and UTI.  CT scan revealed widespread sclerotic osseous metastases likely in the setting of prostate cancer.  Palliative care was consulted to help establish medical goals.  CODE STATUS: Full Code  PAST MEDICAL HISTORY: Past Medical History:  Diagnosis Date  . COPD (chronic obstructive pulmonary disease) ([place])   . Tobacco abuse     PAST SURGICAL HISTORY: History reviewed. No pertinent surgical history.  HEMATOLOGY/ONCOLOGY HISTORY:   No history exists.    ALLERGIES:  has No Known Allergies.  MEDICATIONS:  Current Facility-Administered Medications  Medication Dose Route Frequency Provider Last Rate Last Dose  . 0.9 %  sodium chloride infusion (Manually program via Guardrails IV Fluids)   Intravenous Once Awilda Bill, NP      . 0.9 %  sodium chloride infusion (Manually program via Guardrails IV Fluids)   Intravenous Once Awilda Bill, NP      . 0.9 %  sodium chloride infusion (Manually program via Guardrails IV Fluids)   Intravenous Once Darel Hong D, NP      . 0.9 %  sodium chloride infusion (Manually program via Guardrails IV Fluids)   Intravenous Once Darel Hong D,  NP      . cefTRIAXone (ROCEPHIN) 2 g in sodium chloride 0.9 % 100 mL IVPB  2 g Intravenous q1800 Flora Lipps, MD 200 mL/hr at 09/26/18 1651 2 g at 09/26/18 1651  . dexmedetomidine (PRECEDEX) 400 MCG/100ML (4 mcg/mL) infusion  0.4-1.2 mcg/kg/hr Intravenous Continuous Awilda Bill, NP   Stopped at 09/25/18 1145  . dextrose 5 % solution   Intravenous Continuous Lateef, [place], MD 125 mL/hr at 09/27/18 1400    . docusate sodium (COLACE) capsule 100 mg  100 mg Oral BID Epifanio Lesches, MD   100 mg at 09/27/18 0750  . feeding supplement (ENSURE ENLIVE) (ENSURE ENLIVE) liquid 237 mL  237 mL Oral BID BM Flora Lipps, MD   237 mL at 09/27/18 0911  . folic acid injection 1 mg  1 mg Intravenous Daily Awilda Bill, NP   1 mg at 09/27/18 0751  . insulin aspart (novoLOG) injection 0-9 Units  0-9 Units Subcutaneous Q4H Awilda Bill, NP   1 Units at 09/27/18 1234  . lidocaine (XYLOCAINE) 2 % jelly 1 application  1 application Urethral Once Awilda Bill, NP      . LORazepam (ATIVAN) injection 2-3 mg  2-3 mg Intravenous Q1H PRN Flora Lipps, MD      . morphine 2 MG/ML injection 2 mg  2 mg Intravenous Q2H PRN Flora Lipps, MD   2 mg at 09/26/18 1403  . multivitamin with minerals tablet 1 tablet  1 tablet Oral Daily Flora Lipps, MD   1 tablet at 09/27/18 0750  . ondansetron (ZOFRAN) tablet 4  mg  4 mg Oral Q6H PRN Epifanio Lesches, MD       Or  . ondansetron (ZOFRAN) injection 4 mg  4 mg Intravenous Q6H PRN Epifanio Lesches, MD      . opium-belladonna (B&O SUPPRETTES) 16.2-60 MG suppository 1 suppository  1 suppository Rectal Q8H PRN Stoioff, Ronda Fairly, MD      . Derrill Memo ON 09/28/2018] pantoprazole (PROTONIX) injection 40 mg  40 mg Intravenous Q12H Epifanio Lesches, MD      . sodium chloride flush (NS) 0.9 % injection 10-40 mL  10-40 mL Intracatheter Q12H Epifanio Lesches, MD   10 mL at 09/27/18 0752  . sodium chloride flush (NS) 0.9 % injection 10-40 mL  10-40 mL Intracatheter PRN  Epifanio Lesches, MD      . thiamine (B-1) injection 100 mg  100 mg Intravenous Daily Awilda Bill, NP   100 mg at 09/27/18 0750  . vitamin C (ASCORBIC ACID) tablet 500 mg  500 mg Oral BID Flora Lipps, MD   500 mg at 09/27/18 0751    VITAL SIGNS: BP (!) 84/60   Pulse 80   Temp 97.9 F (36.6 C) (Axillary)   Resp 13   Ht 5\' 7"  (1.702 m)   Wt 144 lb 6.4 oz (65.5 kg)   SpO2 99%   BMI 22.62 kg/m  Filed Weights   09/24/18 1900 09/26/18 0354 09/27/18 0453  Weight: 116 lb 6.5 oz (52.8 kg) 130 lb 8.2 oz (59.2 kg) 144 lb 6.4 oz (65.5 kg)    Estimated body mass index is 22.62 kg/m as calculated from the following:   Height as of this encounter: 5\' 7"  (1.702 m).   Weight as of this encounter: 144 lb 6.4 oz (65.5 kg).  LABS: CBC:    Component Value Date/Time   WBC 5.0 09/27/2018 1004   HGB 7.5 (L) 09/27/2018 1004   HCT 21.1 (L) 09/27/2018 1004   PLT 80 (L) 09/27/2018 1415   MCV 82.5 09/27/2018 1004   NEUTROABS 3.9 09/27/2018 1004   LYMPHSABS 0.9 (L) 09/27/2018 1004   MONOABS 0.1 (L) 09/27/2018 1004   EOSABS 0.1 09/27/2018 1004   BASOSABS 0.0 09/27/2018 1004   Comprehensive Metabolic Panel:    Component Value Date/Time   NA 145 09/27/2018 1415   K 3.4 (L) 09/27/2018 0447   CL 120 (H) 09/27/2018 0447   CO2 20 (L) 09/27/2018 0447   BUN 79 (H) 09/27/2018 0447   CREATININE 3.02 (H) 09/27/2018 0447   GLUCOSE 136 (H) 09/27/2018 0447   CALCIUM 7.2 (L) 09/27/2018 0447   AST 63 (H) 09/24/2018 1931   ALT 17 09/24/2018 1931   ALKPHOS 155 (H) 09/24/2018 1931   BILITOT 1.8 (H) 09/24/2018 1931   PROT 6.4 (L) 09/24/2018 1931   ALBUMIN 2.4 (L) 09/24/2018 1931    RADIOGRAPHIC STUDIES: Ct Abdomen Pelvis Wo Contrast  Result Date: 09/24/2018 CLINICAL DATA:  Altered mental status. Found on the floor. EXAM: CT ABDOMEN AND PELVIS WITHOUT CONTRAST TECHNIQUE: Multidetector CT imaging of the abdomen and pelvis was performed following the standard protocol without IV contrast.  COMPARISON:  None. FINDINGS: Lower chest: Clear lung bases. Hepatobiliary: Multiple small gallstones in the dependent portion of the gallbladder, measuring up to 5 mm in maximum diameter each. No gallbladder wall thickening or pericholecystic fluid. Somewhat small liver with the exception of mildly prominent lateral segment left lobe and caudate lobe. The liver has mildly lobulated contours. Pancreas: Unremarkable. No pancreatic ductal dilatation or surrounding inflammatory changes. Spleen: Mildly  prominent without splenomegaly. Adrenals/Urinary Tract: Mild to moderate dilatation of both renal collecting systems and moderate dilatation of both ureters to the level of the urinary bladder. Foley catheter in the bladder with associated air in the bladder. The bladder is distended, despite presence of the Foley catheter. No calculi seen. Normal appearing adrenal glands. Stomach/Bowel: Unremarkable stomach, small bowel and colon. No evidence of appendicitis. Vascular/Lymphatic: Atheromatous calcifications. Small focal infrarenal abdominal aortic aneurysm measuring 3.1 cm in maximum diameter on image number 42 series 2. No enlarged lymph nodes. Reproductive: Prostate is unremarkable. Other: Minimal free peritoneal fluid adjacent to the liver. Small to moderate-sized umbilical hernia containing fat. Musculoskeletal: Diffuse patchy sclerosis of the bones. IMPRESSION: 1. Mild to moderate bilateral hydronephrosis and moderate bilateral hydroureter to the level of a mildly distended urinary bladder, despite presence of a Foley catheter. No obstructing calculi are seen. 2. Diffuse patchy bone sclerosis with appearance compatible with metastatic prostate cancer. 3. Cholelithiasis without evidence of cholecystitis. 4. Findings suggesting cirrhosis of the liver with minimal ascites. 5. 3.1 cm infrarenal abdominal aortic aneurysm. Recommend followup by ultrasound in 3 years. This recommendation follows ACR consensus guidelines:  White Paper of the ACR Incidental Findings Committee II on Vascular Findings. Joellyn Rued Radiol 2013; [id number] Electronically Signed   By: Claudie Revering M.D.   On: 09/24/2018 19:10   Ct Head Wo Contrast  Result Date: 09/24/2018 CLINICAL DATA:  Altered mental status EXAM: CT HEAD WITHOUT CONTRAST TECHNIQUE: Contiguous axial images were obtained from the base of the skull through the vertex without intravenous contrast. COMPARISON:  None. FINDINGS: Brain: Diffusely enlarged ventricles and subarachnoid spaces. Patchy white matter low density in both cerebral hemispheres. No intracranial hemorrhage, mass lesion or CT evidence of acute infarction. Vascular: No hyperdense vessel or unexpected calcification. Skull: Normal. Negative for fracture or focal lesion. Sinuses/Orbits: Status post bilateral cataract extraction. Unremarkable paranasal sinuses. Other: None. IMPRESSION: 1. No acute abnormality. 2. Moderate diffuse cerebral and cerebellar atrophy and mild chronic small vessel white matter ischemic changes in both cerebral hemispheres. Electronically Signed   By: Claudie Revering M.D.   On: 09/24/2018 19:01   Ct Chest Wo Contrast  Result Date: 09/25/2018 CLINICAL DATA:  Altered mental status, anemia, acute renal failure, thrombocytopenia. Widespread sclerotic osseous lesions, suspicious for metastatic disease. EXAM: CT CHEST WITHOUT CONTRAST TECHNIQUE: Multidetector CT imaging of the chest was performed following the standard protocol without IV contrast. COMPARISON:  Chest radiographs dated 09/24/2018. CTA chest dated 03/25/2016. FINDINGS: Cardiovascular: The heart is normal in size. No pericardial effusion. No evidence of thoracic aortic aneurysm. Atherosclerotic calcifications aortic root/arch. Three vessel coronary atherosclerosis. Mediastinum/Nodes: No suspicious mediastinal lymphadenopathy. Visualized thyroid is unremarkable. Lungs/Pleura: Biapical pleural-parenchymal scarring. Small right pleural effusion,  partially loculated. Small left pleural effusion. No focal consolidation. No pleural effusion or pneumothorax. Upper Abdomen: Unchanged from recent CT, noting mild perihepatic ascites. Musculoskeletal: Widespread/diffuse sclerotic osseous metastases throughout the visualized axial and appendicular skeleton, suggestive of metastatic prostate cancer. IMPRESSION: Widespread/diffuse sclerotic osseous metastases throughout the visualized axial and appendicular skeleton, suggestive of metastatic prostate cancer. Small bilateral pleural effusions, partially loculated on the right. Mild perihepatic ascites, incompletely visualized. Aortic Atherosclerosis (ICD10-I70.0). Electronically Signed   By: Julian Hy M.D.   On: 09/25/2018 23:59   Dg Chest Port 1 View  Result Date: 09/24/2018 CLINICAL DATA:  62 year old male with sepsis. EXAM: PORTABLE CHEST 1 VIEW COMPARISON:  Chest radiographs 04/20/2016 and earlier. FINDINGS: Pacer or resuscitation pads projecting over the central chest. Somewhat large lung  volumes. Mediastinal contours remain normal. Visualized tracheal air column is within normal limits. Allowing for portable technique the lungs are clear. No pneumothorax. Questionable heterogeneous bone mineralization throughout the visible chest and spine. This might be artifact due to technique. Paucity of bowel gas in the upper abdomen. IMPRESSION: 1.  No acute cardiopulmonary abnormality. 2. Questionable new generalized abnormal sclerosis in the visible ribs and spine. This may be artifact but sclerotic metastatic disease to bone is difficult to exclude. Electronically Signed   By: Genevie Ann M.D.   On: 09/24/2018 16:31    PERFORMANCE STATUS (ECOG) : 4 - Bedbound  Review of Systems Unable to provide.   Physical Exam General: Frail, ill-appearing Lungs: Clear to auscultation bilaterally. Heart: Regular rate and rhythm. Abdomen: Soft, nontender, nondistended. No organomegaly noted, normoactive bowel  sounds. Musculoskeletal: No edema, cyanosis, or clubbing. Neuro: Alert, oriented to person and place only. Skin: petechiae to chest.  IMPRESSION: Patient remains in ICU. Off pressors. SeCr and hypernatremia seem to be improving. Has had persistent hematuria.   I again tried talking with patient about his current medical problems. He knows he is at [facility] but cannot elucidate the reason why or detail any information about any of his medical problems. He continues to be intermittently confused and appears to have extremely poor insight. Patient is pending formal capacity evaluation by psychiatry.   Case discussed with SW. I gave information regarding the name and possible address of patient's son.   PLAN: 1. Continue supportive care 2. Recommend psych eval for capacity 3. May need to try to locate children to serve as proxy decision makers   Time Total: 30 minutes  Visit consisted of counseling and education dealing with the complex and emotionally intense issues of symptom management and palliative care in the setting of serious and potentially life-threatening illness.Greater than 50%  of this time was spent counseling and coordinating care related to the above assessment and plan.  Signed by: Altha Harm, [place], NP-C, [place] (Work Cell)

## 2018-09-27 NOTE — Progress Notes (Signed)
Joseph Trevino at [place] NAME: Joseph Trevino    MR#:  [id number]  DATE OF BIRTH:  1956/06/22  SUBJECTIVE:   Patient appears intermittently confused. He knows he is at [facility] He has no insight into his medical problem. REVIEW OF SYSTEMS:   Review of Systems  Unable to perform ROS: Medical condition   Tolerating Diet:yes Tolerating PT: pending  DRUG ALLERGIES:  No Known Allergies  VITALS:  Blood pressure (!) 84/60, pulse 80, temperature 97.9 F (36.6 C), temperature source Axillary, resp. rate 13, height 5\' 7"  (1.702 m), weight 65.5 kg, SpO2 99 %.  PHYSICAL EXAMINATION:   Physical Exam  GENERAL:  62 y.o.-year-old patient lying in the bed with no acute distress. Malnourished EYES: Pupils equal, round, reactive to light and accommodation. No scleral icterus. Extraocular muscles intact.  HEENT: Head atraumatic, normocephalic. Oropharynx and nasopharynx clear.  NECK:  Supple, no jugular venous distention. No thyroid enlargement, no tenderness.  LUNGS: Normal breath sounds bilaterally, no wheezing, rales, rhonchi. No use of accessory muscles of respiration.  CARDIOVASCULAR: S1, S2 normal. No murmurs, rubs, or gallops.  ABDOMEN: Soft, nontender, nondistended. Bowel sounds present. No organomegaly or mass. Foley catheter with red tinged urine EXTREMITIES: No cyanosis, clubbing or edema b/l.    NEUROLOGIC: Cranial nerves II through XII are intact. No focal Motor or sensory deficits b/l.   PSYCHIATRIC:  patient is alert and awake. SKIN: No obvious rash, lesion, or ulcer. Bruises of several staging both upper extremities  LABORATORY PANEL:  CBC Recent Labs  Lab 09/27/18 1004 09/27/18 1415  WBC 5.0  --   HGB 7.5*  --   HCT 21.1*  --   PLT 54* 80*    Chemistries  Recent Labs  Lab 09/24/18 1931  09/27/18 0447  09/27/18 1415  NA 161*   < > 146*   < > 145  K 4.0   < > 3.4*  --   --   CL >130*   < > 120*  --   --   CO2 17*   < >  20*  --   --   GLUCOSE 179*   < > 136*  --   --   BUN 155*   < > 79*  --   --   CREATININE 6.66*   < > 3.02*  --   --   CALCIUM 8.4*   < > 7.2*  --   --   MG  --    < > 1.8  --   --   AST 63*  --   --   --   --   ALT 17  --   --   --   --   ALKPHOS 155*  --   --   --   --   BILITOT 1.8*  --   --   --   --    < > = values in this interval not displayed.   Cardiac Enzymes Recent Labs  Lab 09/24/18 1454  TROPONINI 0.05*   RADIOLOGY:  Ct Chest Wo Contrast  Result Date: 09/25/2018 CLINICAL DATA:  Altered mental status, anemia, acute renal failure, thrombocytopenia. Widespread sclerotic osseous lesions, suspicious for metastatic disease. EXAM: CT CHEST WITHOUT CONTRAST TECHNIQUE: Multidetector CT imaging of the chest was performed following the standard protocol without IV contrast. COMPARISON:  Chest radiographs dated 09/24/2018. CTA chest dated 03/25/2016. FINDINGS: Cardiovascular: The heart is normal in size. No pericardial effusion. No evidence  of thoracic aortic aneurysm. Atherosclerotic calcifications aortic root/arch. Three vessel coronary atherosclerosis. Mediastinum/Nodes: No suspicious mediastinal lymphadenopathy. Visualized thyroid is unremarkable. Lungs/Pleura: Biapical pleural-parenchymal scarring. Small right pleural effusion, partially loculated. Small left pleural effusion. No focal consolidation. No pleural effusion or pneumothorax. Upper Abdomen: Unchanged from recent CT, noting mild perihepatic ascites. Musculoskeletal: Widespread/diffuse sclerotic osseous metastases throughout the visualized axial and appendicular skeleton, suggestive of metastatic prostate cancer. IMPRESSION: Widespread/diffuse sclerotic osseous metastases throughout the visualized axial and appendicular skeleton, suggestive of metastatic prostate cancer. Small bilateral pleural effusions, partially loculated on the right. Mild perihepatic ascites, incompletely visualized. Aortic Atherosclerosis (ICD10-I70.0).  Electronically Signed   By: Julian Hy M.D.   On: 09/25/2018 23:59   ASSESSMENT AND PLAN:  62 year old admitted with severe anemia acute renal failure hypothermia  * septic shock --source appears urine  Compounded by acute severe anemia Now off IV vasopressor support with weaning as tolerated, empiric antibiotics, IV fluids for rehydration   *Acute Severe anemia -- multifactorial Suspected due to gross hematuria , poor nutrition, anemia of chronic disease, prostate cancer, [place] secondary to chronic alcoholism Seen by gastroenterology, was thought not to have variceal or upper GI bleeding/recommended continued CBCs with transfusing as needed/gastroenterology has signed off  *Acute gross hematuria appreciate neurology input. Urology input appreciated, strict I&O monitoring, CBC daily, transfuse as needed - Foley catheter placed in by urology given difficult placement due to enlarged prostate suspected due to prostate cancer  *Acute renal failure imrpvoing -came in with creatinine of 8.0----.6 Secondary to multifactorial process which includes urinary retention/bilateral hydronephrosis, dehydration, and compounded by severe anemia IV fluids for rehydration, avoid nephrotoxic agents, strict I&O monitoring Nephrology and urology input greatly appreciated  *Acute severe hypernatremia due to severe dehydration Continue IV fluids for rehydration  *Chronic alcoholism with cirrhosis, thrombocytopenia, electrolyte abnormalities Continue alcohol withdrawal protocol, PPI and octreotide discontinued  -couple units of platelet transfusion  *Acute newly diagnosed atrial fibrillation Unknown duration Likely secondary to severe anemia, renal failure, hyponatremia  Follow-up on echocardiogram  *Acute incidentally found sclerotic bony lesions -Suspicious for Prostate cancer with metastatic disease -Oncology input appreciated-once patient stabilized patient will need biopsy  for confirmation, may benefit from Lupron injections every 3-6 months which may be done as an outpatient after biopsy PSA greater than 65 suspicious for metastatic prostate cancer  Patient is being followed by palliative care given his very poor prognosis. Given his chronic alcoholism and poor overall health condition not sure if patient has ability to make decision. He is estranged from his family/son and daughter for several years. We'll see if psychiatry can do competency evaluation.  Long-term prognosis is poor-palliative care consultation recommended  Case discussed with Care Management/Social Worker. Management plans discussed with the patient, family and they are in agreement.  CODE STATUS: full  DVT Prophylaxis: scd  TOTAL TIME TAKING CARE OF THIS PATIENT: *30* minutes.  >50% time spent on counselling and coordination of care  POSSIBLE D/C IN few DAYS, DEPENDING ON CLINICAL CONDITION.  Note: This dictation was prepared with Dragon dictation along with smaller phrase technology. Any transcriptional errors that result from this process are unintentional.  Fritzi Mandes M.D on 09/27/2018 at 2:36 PM  Between 7am to 6pm - Pager - [phone]  After 6pm go to [email or web address] - password [email or web address]  [facility]  Office  [phone]  CC: Primary care physician; Patient, No Pcp PerPatient ID: Joseph Trevino, male   DOB: June 21, 1956, 62 y.o.   MRN: [id number]

## 2018-09-28 DIAGNOSIS — Z515 Encounter for palliative care: Secondary | ICD-10-CM

## 2018-09-28 LAB — PREPARE PLATELET PHERESIS
UNIT DIVISION: 0
UNIT DIVISION: 0
UNIT DIVISION: 0

## 2018-09-28 LAB — TYPE AND SCREEN
ABO/RH(D): A NEG
ANTIBODY SCREEN: NEGATIVE
UNIT DIVISION: 0
UNIT DIVISION: 0
UNIT DIVISION: 0
Unit division: 0
Unit division: 0
Unit division: 0
Unit division: 0

## 2018-09-28 LAB — APTT: APTT: 34 s (ref 24–36)

## 2018-09-28 LAB — CBC
HCT: 21.5 % — ABNORMAL LOW (ref 40.0–52.0)
HEMATOCRIT: 17.6 % — AB (ref 40.0–52.0)
HEMOGLOBIN: 6.4 g/dL — AB (ref 13.0–18.0)
Hemoglobin: 7.6 g/dL — ABNORMAL LOW (ref 13.0–18.0)
MCH: 29.6 pg (ref 26.0–34.0)
MCH: 29.6 pg (ref 26.0–34.0)
MCHC: 35.5 g/dL (ref 32.0–36.0)
MCHC: 36.3 g/dL — AB (ref 32.0–36.0)
MCV: 81.5 fL (ref 80.0–100.0)
MCV: 83.4 fL (ref 80.0–100.0)
PLATELETS: 78 10*3/uL — AB (ref 150–440)
Platelets: 94 10*3/uL — ABNORMAL LOW (ref 150–440)
RBC: 2.16 MIL/uL — ABNORMAL LOW (ref 4.40–5.90)
RBC: 2.58 MIL/uL — ABNORMAL LOW (ref 4.40–5.90)
RDW: 18.3 % — ABNORMAL HIGH (ref 11.5–14.5)
RDW: 18 % — AB (ref 11.5–14.5)
WBC: 3.2 10*3/uL — ABNORMAL LOW (ref 3.8–10.6)
WBC: 4.1 10*3/uL (ref 3.8–10.6)

## 2018-09-28 LAB — BPAM PLATELET PHERESIS
BLOOD PRODUCT EXPIRATION DATE: 201910032359
BLOOD PRODUCT EXPIRATION DATE: 201910052359
Blood Product Expiration Date: 201910032359
ISSUE DATE / TIME: 201910030144
ISSUE DATE / TIME: 201910031122
ISSUE DATE / TIME: 201910032039
UNIT TYPE AND RH: 6200
UNIT TYPE AND RH: 7300
Unit Type and Rh: 6200

## 2018-09-28 LAB — BPAM RBC
BLOOD PRODUCT EXPIRATION DATE: 201910082359
BLOOD PRODUCT EXPIRATION DATE: 201910182359
BLOOD PRODUCT EXPIRATION DATE: 201910202359
Blood Product Expiration Date: 201910072359
Blood Product Expiration Date: 201910182359
Blood Product Expiration Date: 201910202359
Blood Product Expiration Date: 201910202359
ISSUE DATE / TIME: 201909301533
ISSUE DATE / TIME: 201909301533
ISSUE DATE / TIME: 201910010947
ISSUE DATE / TIME: 201910021336
ISSUE DATE / TIME: 201910030602
ISSUE DATE / TIME: 201910010251
ISSUE DATE / TIME: 201910010457
UNIT TYPE AND RH: 600
Unit Type and Rh: 5100
Unit Type and Rh: 5100
Unit Type and Rh: 600
Unit Type and Rh: 600
Unit Type and Rh: 600
Unit Type and Rh: 600

## 2018-09-28 LAB — GLUCOSE, CAPILLARY
GLUCOSE-CAPILLARY: 109 mg/dL — AB (ref 70–99)
Glucose-Capillary: 94 mg/dL (ref 70–99)
Glucose-Capillary: 103 mg/dL — ABNORMAL HIGH (ref 70–99)
Glucose-Capillary: 102 mg/dL — ABNORMAL HIGH (ref 70–99)
Glucose-Capillary: 97 mg/dL (ref 70–99)

## 2018-09-28 LAB — BASIC METABOLIC PANEL
Anion gap: 5 (ref 5–15)
BUN: 56 mg/dL — AB (ref 8–23)
CALCIUM: 6.8 mg/dL — AB (ref 8.9–10.3)
CO2: 21 mmol/L — AB (ref 22–32)
Chloride: 117 mmol/L — ABNORMAL HIGH (ref 98–111)
Creatinine, Ser: 2.12 mg/dL — ABNORMAL HIGH (ref 0.61–1.24)
GFR calc Af Amer: 37 mL/min — ABNORMAL LOW (ref >60)
GFR calc non Af Amer: 32 mL/min — ABNORMAL LOW (ref >60)
GLUCOSE: 113 mg/dL — AB (ref 70–99)
POTASSIUM: 3.4 mmol/L — AB (ref 3.5–5.1)
Sodium: 143 mmol/L (ref 135–145)

## 2018-09-28 LAB — PREPARE RBC (CROSSMATCH)

## 2018-09-28 LAB — PROTIME-INR
INR: 1.23
PROTHROMBIN TIME: 15.4 s — AB (ref 11.4–15.2)

## 2018-09-28 MED ORDER — POTASSIUM CHLORIDE CRYS ER 20 MEQ PO TBCR
40.0000 meq | EXTENDED_RELEASE_TABLET | Freq: Once | ORAL | Status: AC
  Administered 2018-09-28: 40 meq via ORAL
  Filled 2018-09-28: qty 2

## 2018-09-28 MED ORDER — SODIUM CHLORIDE 0.9% IV SOLUTION
Freq: Once | INTRAVENOUS | Status: AC
  Administered 2018-09-28: 04:00:00 via INTRAVENOUS

## 2018-09-28 NOTE — Progress Notes (Signed)
[facility]  ROUNDING NOTE   Subjective:  Patient seen in the critical care unit. Eating breakfast at the moment. Sodium down to 143. Hemoglobin remains low at 6.4. Still having hematuria.  Objective:  Vital signs in last 24 hours:  Temp:  [97.6 F (36.4 C)-98.2 F (36.8 C)] 98.1 F (36.7 C) (10/04 0730) Pulse Rate:  [70-99] 86 (10/04 0730) Resp:  [12-29] 17 (10/04 0730) BP: (78-130)/(53-97) 105/70 (10/04 0730) SpO2:  [89 %-100 %] 100 % (10/04 0452) Weight:  [65.5 kg] 65.5 kg (10/04 0438)  Weight change: 0 kg Filed Weights   09/26/18 0354 09/27/18 0453 09/28/18 0438  Weight: 59.2 kg 65.5 kg 65.5 kg    Intake/Output: I/O last 3 completed shifts: In: 82090.7 [P.O.:236; I.V.:2870.7; Blood:984; Other:78000] Out: [phone] [Urine:67950]   Intake/Output this shift:  Total I/O In: 290 [Blood:290] Out: 1700 [Urine:1700]  Physical Exam: General: Critically ill appearing  Head: Normocephalic, atraumatic. moist oral mucosal membranes  Eyes: Anicteric  Neck: Supple, trachea midline  Lungs:  Clear to auscultation, normal effort  Heart: S1S2 no rubs  Abdomen:  Soft, lower abdominal distension noted  Extremities: no peripheral edema.  Neurologic: Awake, alert, following commands  Skin: B/L UE ecchymoses       Basic Metabolic Panel: Recent Labs  Lab 09/25/18 1800 09/25/18 2036  09/26/18 0814  09/26/18 1809 09/27/18 0447 09/27/18 1004 09/27/18 1415 09/27/18 1757 09/28/18 0424  NA 151* 151*   < > 152*   < > 148* 146* 147* 145 144 143  K 3.4* 3.4*  --  3.0*  --  3.6 3.4*  --   --   --  3.4*  CL 125* 125*  --  125*  --   --  120*  --   --   --  117*  CO2 19* 20*  --  20*  --   --  20*  --   --   --  21*  GLUCOSE 177* 167*  --  144*  --   --  136*  --   --   --  113*  BUN 115* 114*  --  100*  --   --  79*  --   --   --  56*  CREATININE 4.24* 3.89*  --  3.72*  --   --  3.02*  --   --   --  2.12*  CALCIUM 7.4* 7.5*  --  7.6*  --   --  7.2*  --   --   --  6.8*   MG  --   --   --  1.8  --   --  1.8  --   --   --   --   PHOS  --   --   --  3.6  --   --  3.5  --   --   --   --    < > = values in this interval not displayed.    Liver Function Tests: Recent Labs  Lab 09/24/18 1454 09/24/18 1931  AST 48* 63*  ALT 11 17  ALKPHOS 179* 155*  BILITOT 1.9* 1.8*  PROT 6.7 6.4*  ALBUMIN 2.5* 2.4*   No results for input(s): LIPASE, AMYLASE in the last 168 hours. Recent Labs  Lab 09/24/18 1931  AMMONIA 42*    CBC: Recent Labs  Lab 09/24/18 1454 09/24/18 1931  09/26/18 0814 09/26/18 1809 09/27/18 0447 09/27/18 1004 09/27/18 1415 09/28/18 0020  WBC 9.9 5.6   < >  7.5 5.0 4.6 5.0  --  3.2*  NEUTROABS 7.6* 4.5  --  5.7 3.8  --  3.9  --   --   HGB 3.8* 7.1*   < > 6.7* 7.6* 6.3* 7.5*  --  6.4*  HCT 12.7* 21.7*   < > 19.2* 22.0* 17.8* 21.1*  --  17.6*  MCV 89.9 83.5   < > 82.9 81.8 79.9* 82.5  --  81.5  PLT 68* 38*   < > 68* 37* 57* 54* 80* 94*   < > = values in this interval not displayed.    Cardiac Enzymes: Recent Labs  Lab 09/24/18 1454 09/25/18 0442  CKTOTAL 73 43*  TROPONINI 0.05*  --     BNP: Invalid input(s): POCBNP  CBG: Recent Labs  Lab 09/27/18 0707 09/27/18 1211 09/27/18 1642 09/27/18 2135 09/28/18 0734  GLUCAP 128* 127* 113* 121* 94    Microbiology: Results for orders placed or performed during the hospital encounter of 09/24/18  Blood Culture (routine x 2)     Status: None (Preliminary result)   Collection Time: 09/24/18  2:54 PM  Result Value Ref Range Status   Specimen Description BLOOD RIGHT AC  Final   Special Requests   Final    BOTTLES DRAWN AEROBIC AND ANAEROBIC Blood Culture results may not be optimal due to an excessive volume of blood received in culture bottles   Culture   Final    NO GROWTH 4 DAYS Performed at [facility], [address] [place], [place] [address]    Report Status PENDING  Incomplete  Urine culture     Status: None   Collection Time: 09/24/18  2:55 PM  Result  Value Ref Range Status   Specimen Description   Final    URINE, RANDOM Performed at [facility], [address], [place], [place] [address]    Special Requests   Final    NONE Performed at [facility], [address], [place], [place] [address]    Culture   Final    NO GROWTH Performed at [facility], [place] [address], [place], [place] [address]    Report Status 09/26/2018 FINAL  Final  MRSA PCR Screening     Status: None   Collection Time: 09/24/18  8:43 PM  Result Value Ref Range Status   MRSA by PCR NEGATIVE NEGATIVE Final    Comment:        The GeneXpert MRSA Assay (FDA approved for NASAL specimens only), is one component of a comprehensive MRSA colonization surveillance program. It is not intended to diagnose MRSA infection nor to guide or monitor treatment for MRSA infections. Performed at [facility], [address] [place], [place] [address]     Coagulation Studies: Recent Labs    09/26/18 0353 09/27/18 0612 09/28/18 0424  LABPROT 17.7* 16.1* 15.4*  INR 1.47 1.30 1.23    Urinalysis: No results for input(s): COLORURINE, LABSPEC, PHURINE, GLUCOSEU, HGBUR, BILIRUBINUR, KETONESUR, PROTEINUR, UROBILINOGEN, NITRITE, LEUKOCYTESUR in the last 72 hours.  Invalid input(s): APPERANCEUR    Imaging: No results found.   Medications:   . cefTRIAXone (ROCEPHIN)  IV 2 g (09/27/18 1655)  . dexmedetomidine (PRECEDEX) IV infusion Stopped (09/25/18 1145)  . dextrose 125 mL/hr at 09/28/18 0153   . sodium chloride   Intravenous Once  . sodium chloride   Intravenous Once  . sodium chloride   Intravenous Once  . sodium chloride   Intravenous Once  . docusate sodium  100 mg Oral BID  . feeding supplement (ENSURE ENLIVE)  237 mL Oral BID BM  . folic acid  1 mg Intravenous Daily  . insulin aspart  0-9 Units Subcutaneous TID WC  . lidocaine  1 application Urethral Once  . multivitamin with minerals  1 tablet Oral Daily  .  pantoprazole  40 mg Intravenous Q12H  . sodium chloride flush  10-40 mL Intracatheter Q12H  . thiamine injection  100 mg Intravenous Daily  . vitamin C  500 mg Oral BID   LORazepam, morphine injection, ondansetron **OR** ondansetron (ZOFRAN) IV, opium-belladonna, sodium chloride flush  Assessment/ Plan:  62 y.o. male with a PMHx of COPD, tobacco abuse, who was admitted to [facility] on 09/24/2018 for evaluation of altered mental status.  It appears that the patient has been residing at a local hotel for approximately 3 months and was abusing ETOH.  Found to have severe metabolic deranagements at admission.   1.  Acute renal failure.  2.  Hypernatremia. 3.  Urinary retention/bilateral hydronephrosis.  4.  Severe anemia.   5.  Metabolic/lactic acidosis.   Plan: Patient is improving.  Creatinine down to 2.12.  Hypernatremia also resolved with a serum sodium of 143.  However he continues to have ongoing anemia with a hemoglobin of 6.4.  Consider blood transfusion but defer to critical care.  Otherwise continue supportive care.  We will continue to monitor his progress along with you.   LOS: 4 Fedra Lanter 10/4/20199:21 AM

## 2018-09-28 NOTE — Clinical Social Work Note (Signed)
Patient has been deemed unable to make his healthcare decisions by psychiatrist. CSW was provided with addresses and names for potential relatives of patient by Palliative Care. CSW has contacted [employer] police and they are going to go to both addresses provided: [place], [place] and [address] in [place], [place]. [place] attempted to find a number associated with patient's son: Avon Mergenthaler but there was no listing here in [place] by that name. Patient's nurse informed CSW that a caseworker from [place] visited patient today and left her card: Ernestina Patches: [phone]. CSW attempted to reach Ms. Olena Heckle but had to leave a message. Shela Leff MSW,LCSW [phone]

## 2018-09-28 NOTE — Progress Notes (Signed)
Daughter called and spoke with this RN. Stated that she would be here later this evening. Daughter is Laquincy Eastridge, phone number [phone]. This RN told daughter that there would be a NP that she could speak with tonight regarding what is going on with her father, but also recommended that she come to the unit tomorrow to speak with various consultants that are involved in her father's care. Daughter stated that she would be here tomorrow as well.

## 2018-09-28 NOTE — Progress Notes (Signed)
[place] [facility] [place] NAME: Joseph Trevino    MR#:  [id number]  DATE OF BIRTH:  Oct 27, 1956  SUBJECTIVE:   Patient appears intermittently confused. He knows he is at [facility]. He has no insight into his medical problem. CBI was started since patient is having persistent hematuria REVIEW OF SYSTEMS:   Review of Systems  Unable to perform ROS: Medical condition   Tolerating Diet:yes Tolerating PT: pending  DRUG ALLERGIES:  No Known Allergies  VITALS:  Blood pressure 117/75, pulse (!) 104, temperature 97.7 F (36.5 C), temperature source Oral, resp. rate (!) 23, height 5\' 7"  (1.702 m), weight 65.5 kg, SpO2 100 %.  PHYSICAL EXAMINATION:   Physical Exam  GENERAL:  62 y.o.-year-old patient lying in the bed with no acute distress. Malnourished EYES: Pupils equal, round, reactive to light and accommodation. No scleral icterus. Extraocular muscles intact.  HEENT: Head atraumatic, normocephalic. Oropharynx and nasopharynx clear.  NECK:  Supple, no jugular venous distention. No thyroid enlargement, no tenderness.  LUNGS: Normal breath sounds bilaterally, no wheezing, rales, rhonchi. No use of accessory muscles of respiration.  CARDIOVASCULAR: S1, S2 normal. No murmurs, rubs, or gallops.  ABDOMEN: Soft, nontender, nondistended. Bowel sounds present. No organomegaly or mass. Foley catheter with red tinged urine EXTREMITIES: No cyanosis, clubbing or edema b/l.    NEUROLOGIC: Cranial nerves II through XII are intact. No focal Motor or sensory deficits b/l.   PSYCHIATRIC:  patient is alert and awake. SKIN: No obvious rash, lesion, or ulcer. Bruises of several staging both upper extremities  LABORATORY PANEL:  CBC Recent Labs  Lab 09/28/18 0954  WBC 4.1  HGB 7.6*  HCT 21.5*  PLT 78*    Chemistries  Recent Labs  Lab 09/24/18 1931  09/27/18 0447  09/28/18 0424  NA 161*   < > 146*   < > 143  K 4.0   < > 3.4*  --  3.4*  CL >130*   < >  120*  --  117*  CO2 17*   < > 20*  --  21*  GLUCOSE 179*   < > 136*  --  113*  BUN 155*   < > 79*  --  56*  CREATININE 6.66*   < > 3.02*  --  2.12*  CALCIUM 8.4*   < > 7.2*  --  6.8*  MG  --    < > 1.8  --   --   AST 63*  --   --   --   --   ALT 17  --   --   --   --   ALKPHOS 155*  --   --   --   --   BILITOT 1.8*  --   --   --   --    < > = values in this interval not displayed.   Cardiac Enzymes Recent Labs  Lab 09/24/18 1454  TROPONINI 0.05*   RADIOLOGY:  No results found. ASSESSMENT AND PLAN:  62 year old admitted with severe anemia acute renal failure hypothermia  * septic shock --source appears urine  Compounded by acute severe anemia Now off IV vasopressor support with weaning as tolerated, empiric antibiotics, IV fluids for rehydration   *Acute Severe anemia -- multifactorial Suspected due to gross hematuria , poor nutrition, anemia of chronic disease, prostate cancer, [place] secondary to chronic alcoholism Seen by gastroenterology, was thought not to have variceal or upper GI bleeding/recommended continued CBCs with transfusing  as needed/gastroenterology has signed off  *Acute gross hematuria appreciate neurology input. Urology input appreciated, strict I&O monitoring, CBC daily, transfuse as needed - Foley catheter placed in by urology given difficult placement due to enlarged prostate suspected due to prostate cancer -patient now has CBI going on  *Acute renal failure imrpvoing -came in with creatinine of 8.0----2.16 Secondary to multifactorial process which includes urinary retention/bilateral hydronephrosis, dehydration, and compounded by severe anemia IV fluids for rehydration, avoid nephrotoxic agents, strict I&O monitoring Nephrology and urology input greatly appreciated  *Acute severe hypernatremia due to severe dehydration Continue IV fluids for rehydration  *Chronic alcoholism with cirrhosis, thrombocytopenia, electrolyte  abnormalities Continue alcohol withdrawal protocol, PPI and octreotide discontinued  -couple units of platelet transfusion  *Acute newly diagnosed atrial fibrillation Unknown duration Likely secondary to severe anemia, renal failure, hyponatremia  Follow-up on echocardiogram  *Acute incidentally found sclerotic bony lesions -Suspicious for Prostate cancer with metastatic disease -Oncology input appreciated-once patient stabilized patient will need biopsy for confirmation, may benefit from Lupron injections every 3-6 months which may be done as an outpatient after biopsy PSA greater than 65 suspicious for metastatic prostate cancer  Patient is being followed by palliative care given his very poor prognosis. Given his chronic alcoholism and poor overall health condition not sure if patient has ability to make decision. He is estranged from his family/son and daughter for several years.  -Patient evaluated by Dr. Weber Cooks. He is deemed not competent in making medical decisions  Long-term prognosis is poor-palliative care consultation recommended  Case discussed with Care Management/Social Worker. Management plans discussed with the patient, family and they are in agreement.  CODE STATUS: full  DVT Prophylaxis: scd  TOTAL TIME TAKING CARE OF THIS PATIENT: *30* minutes.  >50% time spent on counselling and coordination of care  POSSIBLE D/C IN few DAYS, DEPENDING ON CLINICAL CONDITION.  Note: This dictation was prepared with Dragon dictation along with smaller phrase technology. Any transcriptional errors that result from this process are unintentional.  Joseph Trevino M.D on 09/28/2018 at 2:45 PM  Between 7am to 6pm - Pager - [phone]  After 6pm go to [email or web address] - password [employer]  [facility]  Office  [phone]  CC: Primary care physician; Patient, No Pcp PerPatient ID: Joseph Trevino, male   DOB: 05-04-1956, 62 y.o.   MRN: [id number]

## 2018-09-28 NOTE — Progress Notes (Signed)
Follow up - Critical Care Medicine Note  Patient Details:    Joseph Trevino is an 62 y.o. male.with multiple medical problems including COPD, tobacco and alcohol abuse, who was admitted on 9/30 with altered mental status after being found unresponsive in his hotel room, apparently surrounded by empty alcohol bottles and covered in stool.He initially refused transport to the ER but was involuntarily committed. Initial work-up revealed multiple metabolic derangements, acute renal failure, severe anemia with hematuria, and UTI. CT scan revealed widespread sclerotic osseous metastases likely in the setting of prostate cancer.  Lines, Airways, Drains: CVC Triple Lumen 09/24/18 Right Femoral (Active)  Indication for Insertion or Continuance of Line Prolonged intravenous therapies 09/28/2018  7:46 AM  Site Assessment Clean;Dry;Intact 09/27/2018  8:00 PM  Proximal Lumen Status Infusing;Flushed;Blood return noted 09/27/2018  8:00 PM  Medial Lumen Status Flushed;Saline locked;Blood return noted 09/27/2018  8:00 PM  Distal Lumen Status Flushed;Blood return noted;In-line blood sampling system in place 09/27/2018  8:00 PM  Dressing Type Transparent 09/27/2018  8:00 PM  Dressing Status Clean;Dry;Intact;Antimicrobial disc in place 09/27/2018  8:00 PM  Line Care Other (Comment) 09/28/2018  4:00 AM  Dressing Intervention Dressing changed 09/26/2018  8:00 AM  Dressing Change Due 10/03/18 09/27/2018  8:00 PM     Urethral Catheter Dr. Erlene Quan Coude;Triple-lumen 24 Fr. (Active)  Indication for Insertion or Continuance of Catheter Bladder outlet obstruction / other urologic reason 09/28/2018  2:00 AM  Site Assessment Clean;Intact 09/26/2018 10:00 PM  Catheter Maintenance Bag below level of bladder;Drainage bag/tubing not touching floor;No dependent loops;Seal intact 09/28/2018  2:00 AM  Collection Container Standard drainage bag 09/28/2018  2:00 AM  Securement Method Leg strap 09/28/2018  2:00 AM  Urinary Catheter  Interventions Irrigated 09/28/2018  2:00 AM  Output (mL) 1700 mL 09/28/2018  7:41 AM    Anti-infectives:  Anti-infectives (From admission, onward)   Start     Dose/Rate Route Frequency Ordered Stop   09/25/18 1800  cefTRIAXone (ROCEPHIN) 2 g in sodium chloride 0.9 % 100 mL IVPB     2 g 200 mL/hr over 30 Minutes Intravenous Daily-1800 09/25/18 0956 09/29/18 1759   09/25/18 0830  vancomycin (VANCOCIN) IVPB 1000 mg/200 mL premix  Status:  Discontinued     1,000 mg 200 mL/hr over 60 Minutes Intravenous every 72 hours 09/24/18 1938 09/25/18 0956   09/25/18 0000  ceFEPIme (MAXIPIME) 1 g in sodium chloride 0.9 % 100 mL IVPB  Status:  Discontinued     1 g 200 mL/hr over 30 Minutes Intravenous Every 24 hours 09/24/18 1731 09/24/18 2018   09/24/18 2100  cefTRIAXone (ROCEPHIN) 2 g in sodium chloride 0.9 % 100 mL IVPB  Status:  Discontinued     2 g 200 mL/hr over 30 Minutes Intravenous Every 24 hours 09/24/18 2018 09/25/18 0956   09/24/18 1936  vancomycin variable dose per unstable renal function (pharmacist dosing)  Status:  Discontinued      Does not apply See admin instructions 09/24/18 1938 09/25/18 1037   09/24/18 1600  ceFEPIme (MAXIPIME) 2 g in sodium chloride 0.9 % 100 mL IVPB     2 g 200 mL/hr over 30 Minutes Intravenous  Once 09/24/18 1556 09/24/18 1735   09/24/18 1600  metroNIDAZOLE (FLAGYL) IVPB 500 mg  Status:  Discontinued     500 mg 100 mL/hr over 60 Minutes Intravenous Every 8 hours 09/24/18 1556 09/24/18 2018   09/24/18 1600  vancomycin (VANCOCIN) IVPB 1000 mg/200 mL premix     1,000 mg 200  mL/hr over 60 Minutes Intravenous  Once 09/24/18 1556 09/24/18 1904      Microbiology: Results for orders placed or performed during the hospital encounter of 09/24/18  Blood Culture (routine x 2)     Status: None (Preliminary result)   Collection Time: 09/24/18  2:54 PM  Result Value Ref Range Status   Specimen Description BLOOD RIGHT AC  Final   Special Requests   Final    BOTTLES  DRAWN AEROBIC AND ANAEROBIC Blood Culture results may not be optimal due to an excessive volume of blood received in culture bottles   Culture   Final    NO GROWTH 4 DAYS Performed at [facility], [address], [place], [place] [address]    Report Status PENDING  Incomplete  Urine culture     Status: None   Collection Time: 09/24/18  2:55 PM  Result Value Ref Range Status   Specimen Description   Final    URINE, RANDOM Performed at [facility], [address], [place], [place] [address]    Special Requests   Final    NONE Performed at [facility], [address] [place], [place] [address]    Culture   Final    NO GROWTH Performed at [facility], [place] [address], [place], [place] [address]    Report Status 09/26/2018 FINAL  Final  MRSA PCR Screening     Status: None   Collection Time: 09/24/18  8:43 PM  Result Value Ref Range Status   MRSA by PCR NEGATIVE NEGATIVE Final    Comment:        The GeneXpert MRSA Assay (FDA approved for NASAL specimens only), is one component of a comprehensive MRSA colonization surveillance program. It is not intended to diagnose MRSA infection nor to guide or monitor treatment for MRSA infections. Performed at [facility], [address] [place], [place] [address]    Studies: Ct Abdomen Pelvis Wo Contrast  Result Date: 09/24/2018 CLINICAL DATA:  Altered mental status. Found on the floor. EXAM: CT ABDOMEN AND PELVIS WITHOUT CONTRAST TECHNIQUE: Multidetector CT imaging of the abdomen and pelvis was performed following the standard protocol without IV contrast. COMPARISON:  None. FINDINGS: Lower chest: Clear lung bases. Hepatobiliary: Multiple small gallstones in the dependent portion of the gallbladder, measuring up to 5 mm in maximum diameter each. No gallbladder wall thickening or pericholecystic fluid. Somewhat small liver with the exception of mildly prominent lateral segment left lobe and  caudate lobe. The liver has mildly lobulated contours. Pancreas: Unremarkable. No pancreatic ductal dilatation or surrounding inflammatory changes. Spleen: Mildly prominent without splenomegaly. Adrenals/Urinary Tract: Mild to moderate dilatation of both renal collecting systems and moderate dilatation of both ureters to the level of the urinary bladder. Foley catheter in the bladder with associated air in the bladder. The bladder is distended, despite presence of the Foley catheter. No calculi seen. Normal appearing adrenal glands. Stomach/Bowel: Unremarkable stomach, small bowel and colon. No evidence of appendicitis. Vascular/Lymphatic: Atheromatous calcifications. Small focal infrarenal abdominal aortic aneurysm measuring 3.1 cm in maximum diameter on image number 42 series 2. No enlarged lymph nodes. Reproductive: Prostate is unremarkable. Other: Minimal free peritoneal fluid adjacent to the liver. Small to moderate-sized umbilical hernia containing fat. Musculoskeletal: Diffuse patchy sclerosis of the bones. IMPRESSION: 1. Mild to moderate bilateral hydronephrosis and moderate bilateral hydroureter to the level of a mildly distended urinary bladder, despite presence of a Foley catheter. No obstructing calculi are seen. 2. Diffuse  patchy bone sclerosis with appearance compatible with metastatic prostate cancer. 3. Cholelithiasis without evidence of cholecystitis. 4. Findings suggesting cirrhosis of the liver with minimal ascites. 5. 3.1 cm infrarenal abdominal aortic aneurysm. Recommend followup by ultrasound in 3 years. This recommendation follows ACR consensus guidelines: White Paper of the ACR Incidental Findings Committee II on Vascular Findings. Joellyn Rued Radiol 2013; [phone] Electronically Signed   By: Claudie Revering M.D.   On: 09/24/2018 19:10   Ct Head Wo Contrast  Result Date: 09/24/2018 CLINICAL DATA:  Altered mental status EXAM: CT HEAD WITHOUT CONTRAST TECHNIQUE: Contiguous axial images were  obtained from the base of the skull through the vertex without intravenous contrast. COMPARISON:  None. FINDINGS: Brain: Diffusely enlarged ventricles and subarachnoid spaces. Patchy white matter low density in both cerebral hemispheres. No intracranial hemorrhage, mass lesion or CT evidence of acute infarction. Vascular: No hyperdense vessel or unexpected calcification. Skull: Normal. Negative for fracture or focal lesion. Sinuses/Orbits: Status post bilateral cataract extraction. Unremarkable paranasal sinuses. Other: None. IMPRESSION: 1. No acute abnormality. 2. Moderate diffuse cerebral and cerebellar atrophy and mild chronic small vessel white matter ischemic changes in both cerebral hemispheres. Electronically Signed   By: Claudie Revering M.D.   On: 09/24/2018 19:01   Ct Chest Wo Contrast  Result Date: 09/25/2018 CLINICAL DATA:  Altered mental status, anemia, acute renal failure, thrombocytopenia. Widespread sclerotic osseous lesions, suspicious for metastatic disease. EXAM: CT CHEST WITHOUT CONTRAST TECHNIQUE: Multidetector CT imaging of the chest was performed following the standard protocol without IV contrast. COMPARISON:  Chest radiographs dated 09/24/2018. CTA chest dated 03/25/2016. FINDINGS: Cardiovascular: The heart is normal in size. No pericardial effusion. No evidence of thoracic aortic aneurysm. Atherosclerotic calcifications aortic root/arch. Three vessel coronary atherosclerosis. Mediastinum/Nodes: No suspicious mediastinal lymphadenopathy. Visualized thyroid is unremarkable. Lungs/Pleura: Biapical pleural-parenchymal scarring. Small right pleural effusion, partially loculated. Small left pleural effusion. No focal consolidation. No pleural effusion or pneumothorax. Upper Abdomen: Unchanged from recent CT, noting mild perihepatic ascites. Musculoskeletal: Widespread/diffuse sclerotic osseous metastases throughout the visualized axial and appendicular skeleton, suggestive of metastatic prostate  cancer. IMPRESSION: Widespread/diffuse sclerotic osseous metastases throughout the visualized axial and appendicular skeleton, suggestive of metastatic prostate cancer. Small bilateral pleural effusions, partially loculated on the right. Mild perihepatic ascites, incompletely visualized. Aortic Atherosclerosis (ICD10-I70.0). Electronically Signed   By: Julian Hy M.D.   On: 09/25/2018 23:59   Dg Chest Port 1 View  Result Date: 09/24/2018 CLINICAL DATA:  62 year old male with sepsis. EXAM: PORTABLE CHEST 1 VIEW COMPARISON:  Chest radiographs 04/20/2016 and earlier. FINDINGS: Pacer or resuscitation pads projecting over the central chest. Somewhat large lung volumes. Mediastinal contours remain normal. Visualized tracheal air column is within normal limits. Allowing for portable technique the lungs are clear. No pneumothorax. Questionable heterogeneous bone mineralization throughout the visible chest and spine. This might be artifact due to technique. Paucity of bowel gas in the upper abdomen. IMPRESSION: 1.  No acute cardiopulmonary abnormality. 2. Questionable new generalized abnormal sclerosis in the visible ribs and spine. This may be artifact but sclerotic metastatic disease to bone is difficult to exclude. Electronically Signed   By: Genevie Ann M.D.   On: 09/24/2018 16:31    Consults: Treatment Team:  Anthonette Legato, MD Pccm, Ander Gaster, MD Clovis Fredrickson, MD Ardis Hughs, MD Lloyd Huger, MD Abbie Sons, MD Clapacs, Madie Reno, MD Hollice Espy, MD   Subjective:    Overnight Issues: No significant issues overnight.  Patient remains on continuous bladder irrigation, requiring  transfusions for anemia  Objective:  Vital signs for last 24 hours: Temp:  [97.6 F (36.4 C)-98.2 F (36.8 C)] 98.1 F (36.7 C) (10/04 0730) Pulse Rate:  [70-99] 86 (10/04 0730) Resp:  [12-29] 17 (10/04 0730) BP: (78-130)/(53-97) 105/70 (10/04 0730) SpO2:  [89 %-100 %] 100 %  (10/04 0452) Weight:  [65.5 kg] 65.5 kg (10/04 0438)  Hemodynamic parameters for last 24 hours:    Intake/Output from previous day: 10/03 0701 - 10/04 0700 In: 45625.6 [P.O.:236; I.V.:1501.2; Blood:880] Out: 38937 [Urine:49550]  Intake/Output this shift: Total I/O In: 290 [Blood:290] Out: 1700 [Urine:1700]  Vent settings for last 24 hours:    Physical Exam:  Vital signs: Please see the above listed vital signs HEENT: Trachea midline, no accessory muscle utilization, no thyromegaly appreciated Cardiovascular: Tachycardia regular rate and rhythm Pulmonary: Clear to auscultation Abdominal: Positive bowel sounds, soft exam Extremities: No clubbing, cyanosis or edema noted Foley catheter in place with continuous bladder irrigation still with sanguinous urine  Assessment/Plan:   Widespread bony metastatic disease most likely secondary to prostate cancer.  Is being followed by oncology, urology, nephrology and palliative care.  Is requiring continuous bladder irrigation with frequent transfusions secondary to recurrent anemia.  Underlying history of EtOH abuse with cirrhosis.  Is at risk for DTs, is on CIWA protocol.  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  Pancytopenia.  Transfuse as needed  Hypokalemia.  Will replace as needed  Renal failure.  Significant improvement since admission BUN 56/creatinine 2.12, potassium 3.4, anion gap of 5 with a CO2 of 21.  Continuing continuous bladder irrigation  We will need to discuss goals of care with family, appreciate palliative care and case management assistance   Soua Lenk 09/28/2018  *Care during the described time interval was provided by me and/or other providers on the critical care team.  I have reviewed this patient's available data, including medical history, events of note, physical examination and test results as part of my evaluation.

## 2018-09-28 NOTE — Progress Notes (Signed)
Urology Consult Follow Up  Subjective: No complaints today.  Seen by psychiatry and deemed not to have capacity for medical decision making.  Anti-infectives: Anti-infectives (From admission, onward)   Start     Dose/Rate Route Frequency Ordered Stop   09/25/18 1800  cefTRIAXone (ROCEPHIN) 2 g in sodium chloride 0.9 % 100 mL IVPB     2 g 200 mL/hr over 30 Minutes Intravenous Daily-1800 09/25/18 0956 09/29/18 1759   09/25/18 0830  vancomycin (VANCOCIN) IVPB 1000 mg/200 mL premix  Status:  Discontinued     1,000 mg 200 mL/hr over 60 Minutes Intravenous every 72 hours 09/24/18 1938 09/25/18 0956   09/25/18 0000  ceFEPIme (MAXIPIME) 1 g in sodium chloride 0.9 % 100 mL IVPB  Status:  Discontinued     1 g 200 mL/hr over 30 Minutes Intravenous Every 24 hours 09/24/18 1731 09/24/18 2018   09/24/18 2100  cefTRIAXone (ROCEPHIN) 2 g in sodium chloride 0.9 % 100 mL IVPB  Status:  Discontinued     2 g 200 mL/hr over 30 Minutes Intravenous Every 24 hours 09/24/18 2018 09/25/18 0956   09/24/18 1936  vancomycin variable dose per unstable renal function (pharmacist dosing)  Status:  Discontinued      Does not apply See admin instructions 09/24/18 1938 09/25/18 1037   09/24/18 1600  ceFEPIme (MAXIPIME) 2 g in sodium chloride 0.9 % 100 mL IVPB     2 g 200 mL/hr over 30 Minutes Intravenous  Once 09/24/18 1556 09/24/18 1735   09/24/18 1600  metroNIDAZOLE (FLAGYL) IVPB 500 mg  Status:  Discontinued     500 mg 100 mL/hr over 60 Minutes Intravenous Every 8 hours 09/24/18 1556 09/24/18 2018   09/24/18 1600  vancomycin (VANCOCIN) IVPB 1000 mg/200 mL premix     1,000 mg 200 mL/hr over 60 Minutes Intravenous  Once 09/24/18 1556 09/24/18 1904      Current Facility-Administered Medications  Medication Dose Route Frequency Provider Last Rate Last Dose  . 0.9 %  sodium chloride infusion (Manually program via Guardrails IV Fluids)   Intravenous Once Awilda Bill, NP      . 0.9 %  sodium chloride infusion  (Manually program via Guardrails IV Fluids)   Intravenous Once Awilda Bill, NP      . 0.9 %  sodium chloride infusion (Manually program via Guardrails IV Fluids)   Intravenous Once Darel Hong D, NP      . 0.9 %  sodium chloride infusion (Manually program via Guardrails IV Fluids)   Intravenous Once Darel Hong D, NP      . cefTRIAXone (ROCEPHIN) 2 g in sodium chloride 0.9 % 100 mL IVPB  2 g Intravenous q1800 Flora Lipps, MD 200 mL/hr at 09/27/18 1655 2 g at 09/27/18 1655  . dexmedetomidine (PRECEDEX) 400 MCG/100ML (4 mcg/mL) infusion  0.4-1.2 mcg/kg/hr Intravenous Continuous Awilda Bill, NP   Stopped at 09/25/18 1145  . dextrose 5 % solution   Intravenous Continuous Lateef, Munsoor, MD 125 mL/hr at 09/28/18 1020    . docusate sodium (COLACE) capsule 100 mg  100 mg Oral BID Epifanio Lesches, MD   100 mg at 09/28/18 0924  . feeding supplement (ENSURE ENLIVE) (ENSURE ENLIVE) liquid 237 mL  237 mL Oral BID BM Flora Lipps, MD   237 mL at 09/28/18 0926  . folic acid injection 1 mg  1 mg Intravenous Daily Awilda Bill, NP   1 mg at 09/28/18 0929  . insulin aspart (novoLOG) injection 0-9  Units  0-9 Units Subcutaneous TID WC Kasa, Kurian, MD      . lidocaine (XYLOCAINE) 2 % jelly 1 application  1 application Urethral Once Awilda Bill, NP      . LORazepam (ATIVAN) injection 2-3 mg  2-3 mg Intravenous Q1H PRN Flora Lipps, MD      . morphine 2 MG/ML injection 2 mg  2 mg Intravenous Q2H PRN Flora Lipps, MD   2 mg at 09/26/18 1403  . multivitamin with minerals tablet 1 tablet  1 tablet Oral Daily Flora Lipps, MD   1 tablet at 09/28/18 0924  . ondansetron (ZOFRAN) tablet 4 mg  4 mg Oral Q6H PRN Epifanio Lesches, MD       Or  . ondansetron (ZOFRAN) injection 4 mg  4 mg Intravenous Q6H PRN Epifanio Lesches, MD      . opium-belladonna (B&O SUPPRETTES) 16.2-60 MG suppository 1 suppository  1 suppository Rectal Q8H PRN Giorgi Debruin C, MD      . pantoprazole (PROTONIX)  injection 40 mg  40 mg Intravenous Q12H Epifanio Lesches, MD   40 mg at 09/28/18 0443  . sodium chloride flush (NS) 0.9 % injection 10-40 mL  10-40 mL Intracatheter Q12H Epifanio Lesches, MD   10 mL at 09/28/18 1110  . sodium chloride flush (NS) 0.9 % injection 10-40 mL  10-40 mL Intracatheter PRN Epifanio Lesches, MD      . thiamine (B-1) injection 100 mg  100 mg Intravenous Daily Awilda Bill, NP   100 mg at 09/28/18 0927  . vitamin C (ASCORBIC ACID) tablet 500 mg  500 mg Oral BID Flora Lipps, MD   500 mg at 09/28/18 5027     Objective: Vital signs in last 24 hours: Temp:  [97.6 F (36.4 C)-98.2 F (36.8 C)] 98.1 F (36.7 C) (10/04 0730) Pulse Rate:  [75-104] 104 (10/04 0900) Resp:  [12-29] 15 (10/04 1100) BP: (84-130)/(53-97) 90/55 (10/04 1100) SpO2:  [89 %-100 %] 100 % (10/04 0452) Weight:  [65.5 kg] 65.5 kg (10/04 0438)  Intake/Output from previous day: 10/03 0701 - 10/04 0700 In: 74128.7 [P.O.:236; I.V.:1501.2; Blood:880] Out: 86767 [MCNOB:09628] Intake/Output this shift: Total I/O In: 1999.5 [I.V.:1709.5; Blood:290] Out: 5025 [Urine:5025]   Physical Exam: CBI effluent darker than pink-tinged but translucent and inflow is much lower than past 3 days.  Lab Results:  Recent Labs    09/28/18 0020 09/28/18 0954  WBC 3.2* 4.1  HGB 6.4* 7.6*  HCT 17.6* 21.5*  PLT 94* 78*   BMET Recent Labs    09/27/18 0447  09/27/18 1757 09/28/18 0424  NA 146*   < > 144 143  K 3.4*  --   --  3.4*  CL 120*  --   --  117*  CO2 20*  --   --  21*  GLUCOSE 136*  --   --  113*  BUN 79*  --   --  56*  CREATININE 3.02*  --   --  2.12*  CALCIUM 7.2*  --   --  6.8*   < > = values in this interval not displayed.   PT/INR Recent Labs    09/27/18 0612 09/28/18 0424  LABPROT 16.1* 15.4*  INR 1.30 1.23    Assessment: 62 year old male with probable metastatic prostate cancer.  He is still requiring CBI but today the inflow is much less with the effluent  acceptable.  Palliative care attempting to locate son to provide medical decision making.  Recommendation:  1.  Continue CBI and titrate and hopefully taper.  He still has thrombocytopenia at less than 100,000.  2.  His creatinine is improving and with catheter drainage would recommend a follow-up ultrasound to see if his hydronephrosis has improved.  3.  With questionable urosepsis probably better to hold off on prostate biopsy at this time.  In addition he has been deemed incapable of making complex medical decisions.    LOS: 4 days    Abbie Sons 09/28/2018

## 2018-09-28 NOTE — Progress Notes (Signed)
[place]  [phone] [phone]   Name: Joseph Trevino Date: 09/28/2018 MRN: [id number]  DOB: 05-08-1956  Patient Care Team: Patient, [name] as PCP - General (General Practice)    REASON FOR CONSULTATION: Palliative Care consult requested for this 62 y.o. male for goals of medical treatment in patient with multiple medical problems including COPD, tobacco and alcohol abuse, who was admitted on 9/30 with altered mental status after being found unresponsive in his hotel room, apparently surrounded by empty alcohol bottles and covered in stool.  He initially refused transport to the ER but was involuntarily committed.  Initial work-up revealed multiple metabolic derangements, acute renal failure, severe anemia with hematuria, and UTI.  CT scan revealed widespread sclerotic osseous metastases likely in the setting of prostate cancer.  Palliative care was consulted to help establish medical goals.  CODE STATUS: Full Code  PAST MEDICAL HISTORY: Past Medical History:  Diagnosis Date  . COPD (chronic obstructive pulmonary disease) ([place])   . Tobacco abuse     PAST SURGICAL HISTORY: History reviewed. No pertinent surgical history.  HEMATOLOGY/ONCOLOGY HISTORY:   No history exists.    ALLERGIES:  has No Known Allergies.  MEDICATIONS:  Current Facility-Administered Medications  Medication Dose Route Frequency Provider Last Rate Last Dose  . 0.9 %  sodium chloride infusion (Manually program via Guardrails IV Fluids)   Intravenous Once Awilda Bill, NP      . 0.9 %  sodium chloride infusion (Manually program via Guardrails IV Fluids)   Intravenous Once Awilda Bill, NP      . 0.9 %  sodium chloride infusion (Manually program via Guardrails IV Fluids)   Intravenous Once Darel Hong D, NP      . 0.9 %  sodium chloride infusion (Manually program via Guardrails IV Fluids)   Intravenous Once Darel Hong D,  NP      . cefTRIAXone (ROCEPHIN) 2 g in sodium chloride 0.9 % 100 mL IVPB  2 g Intravenous q1800 Flora Lipps, MD 200 mL/hr at 09/27/18 1655 2 g at 09/27/18 1655  . dexmedetomidine (PRECEDEX) 400 MCG/100ML (4 mcg/mL) infusion  0.4-1.2 mcg/kg/hr Intravenous Continuous Awilda Bill, NP   Stopped at 09/25/18 1145  . dextrose 5 % solution   Intravenous Continuous Holley Raring, [place], MD 125 mL/hr at 09/28/18 0153    . docusate sodium (COLACE) capsule 100 mg  100 mg Oral BID Epifanio Lesches, MD   100 mg at 09/28/18 0924  . feeding supplement (ENSURE ENLIVE) (ENSURE ENLIVE) liquid 237 mL  237 mL Oral BID BM Flora Lipps, MD   237 mL at 09/28/18 0926  . folic acid injection 1 mg  1 mg Intravenous Daily Awilda Bill, NP   1 mg at 09/28/18 0929  . insulin aspart (novoLOG) injection 0-9 Units  0-9 Units Subcutaneous TID WC Kasa, Kurian, MD      . lidocaine (XYLOCAINE) 2 % jelly 1 application  1 application Urethral Once Awilda Bill, NP      . LORazepam (ATIVAN) injection 2-3 mg  2-3 mg Intravenous Q1H PRN Flora Lipps, MD      . morphine 2 MG/ML injection 2 mg  2 mg Intravenous Q2H PRN Flora Lipps, MD   2 mg at 09/26/18 1403  . multivitamin with minerals tablet 1 tablet  1 tablet Oral Daily Flora Lipps, MD   1 tablet at 09/28/18 0924  . ondansetron (ZOFRAN) tablet 4 mg  4  mg Oral Q6H PRN Epifanio Lesches, MD       Or  . ondansetron (ZOFRAN) injection 4 mg  4 mg Intravenous Q6H PRN Epifanio Lesches, MD      . opium-belladonna (B&O SUPPRETTES) 16.2-60 MG suppository 1 suppository  1 suppository Rectal Q8H PRN Stoioff, Scott C, MD      . pantoprazole (PROTONIX) injection 40 mg  40 mg Intravenous Q12H Epifanio Lesches, MD   40 mg at 09/28/18 0443  . sodium chloride flush (NS) 0.9 % injection 10-40 mL  10-40 mL Intracatheter Q12H Epifanio Lesches, MD   30 mL at 09/27/18 2248  . sodium chloride flush (NS) 0.9 % injection 10-40 mL  10-40 mL Intracatheter PRN Epifanio Lesches, MD       . thiamine (B-1) injection 100 mg  100 mg Intravenous Daily Awilda Bill, NP   100 mg at 09/28/18 0927  . vitamin C (ASCORBIC ACID) tablet 500 mg  500 mg Oral BID Flora Lipps, MD   500 mg at 09/28/18 0924    VITAL SIGNS: BP 105/70   Pulse 86   Temp 98.1 F (36.7 C) (Oral)   Resp 17   Ht 5\' 7"  (1.702 m)   Wt 144 lb 6.4 oz (65.5 kg)   SpO2 100%   BMI 22.62 kg/m  Filed Weights   09/26/18 0354 09/27/18 0453 09/28/18 0438  Weight: 130 lb 8.2 oz (59.2 kg) 144 lb 6.4 oz (65.5 kg) 144 lb 6.4 oz (65.5 kg)    Estimated body mass index is 22.62 kg/m as calculated from the following:   Height as of this encounter: 5\' 7"  (1.702 m).   Weight as of this encounter: 144 lb 6.4 oz (65.5 kg).  LABS: CBC:    Component Value Date/Time   WBC 3.2 (L) 09/28/2018 0020   HGB 6.4 (L) 09/28/2018 0020   HCT 17.6 (L) 09/28/2018 0020   PLT 94 (L) 09/28/2018 0020   MCV 81.5 09/28/2018 0020   NEUTROABS 3.9 09/27/2018 1004   LYMPHSABS 0.9 (L) 09/27/2018 1004   MONOABS 0.1 (L) 09/27/2018 1004   EOSABS 0.1 09/27/2018 1004   BASOSABS 0.0 09/27/2018 1004   Comprehensive Metabolic Panel:    Component Value Date/Time   NA 143 09/28/2018 0424   K 3.4 (L) 09/28/2018 0424   CL 117 (H) 09/28/2018 0424   CO2 21 (L) 09/28/2018 0424   BUN 56 (H) 09/28/2018 0424   CREATININE 2.12 (H) 09/28/2018 0424   GLUCOSE 113 (H) 09/28/2018 0424   CALCIUM 6.8 (L) 09/28/2018 0424   AST 63 (H) 09/24/2018 1931   ALT 17 09/24/2018 1931   ALKPHOS 155 (H) 09/24/2018 1931   BILITOT 1.8 (H) 09/24/2018 1931   PROT 6.4 (L) 09/24/2018 1931   ALBUMIN 2.4 (L) 09/24/2018 1931    RADIOGRAPHIC STUDIES: Ct Abdomen Pelvis Wo Contrast  Result Date: 09/24/2018 CLINICAL DATA:  Altered mental status. Found on the floor. EXAM: CT ABDOMEN AND PELVIS WITHOUT CONTRAST TECHNIQUE: Multidetector CT imaging of the abdomen and pelvis was performed following the standard protocol without IV contrast. COMPARISON:  None. FINDINGS: Lower  chest: Clear lung bases. Hepatobiliary: Multiple small gallstones in the dependent portion of the gallbladder, measuring up to 5 mm in maximum diameter each. No gallbladder wall thickening or pericholecystic fluid. Somewhat small liver with the exception of mildly prominent lateral segment left lobe and caudate lobe. The liver has mildly lobulated contours. Pancreas: Unremarkable. No pancreatic ductal dilatation or surrounding inflammatory changes. Spleen: Mildly prominent without splenomegaly.  Adrenals/Urinary Tract: Mild to moderate dilatation of both renal collecting systems and moderate dilatation of both ureters to the level of the urinary bladder. Foley catheter in the bladder with associated air in the bladder. The bladder is distended, despite presence of the Foley catheter. No calculi seen. Normal appearing adrenal glands. Stomach/Bowel: Unremarkable stomach, small bowel and colon. No evidence of appendicitis. Vascular/Lymphatic: Atheromatous calcifications. Small focal infrarenal abdominal aortic aneurysm measuring 3.1 cm in maximum diameter on image number 42 series 2. No enlarged lymph nodes. Reproductive: Prostate is unremarkable. Other: Minimal free peritoneal fluid adjacent to the liver. Small to moderate-sized umbilical hernia containing fat. Musculoskeletal: Diffuse patchy sclerosis of the bones. IMPRESSION: 1. Mild to moderate bilateral hydronephrosis and moderate bilateral hydroureter to the level of a mildly distended urinary bladder, despite presence of a Foley catheter. No obstructing calculi are seen. 2. Diffuse patchy bone sclerosis with appearance compatible with metastatic prostate cancer. 3. Cholelithiasis without evidence of cholecystitis. 4. Findings suggesting cirrhosis of the liver with minimal ascites. 5. 3.1 cm infrarenal abdominal aortic aneurysm. Recommend followup by ultrasound in 3 years. This recommendation follows ACR consensus guidelines: White Paper of the ACR Incidental  Findings Committee II on Vascular Findings. Joellyn Rued Radiol 2013; [phone] Electronically Signed   By: Claudie Revering M.D.   On: 09/24/2018 19:10   Ct Head Wo Contrast  Result Date: 09/24/2018 CLINICAL DATA:  Altered mental status EXAM: CT HEAD WITHOUT CONTRAST TECHNIQUE: Contiguous axial images were obtained from the base of the skull through the vertex without intravenous contrast. COMPARISON:  None. FINDINGS: Brain: Diffusely enlarged ventricles and subarachnoid spaces. Patchy white matter low density in both cerebral hemispheres. No intracranial hemorrhage, mass lesion or CT evidence of acute infarction. Vascular: No hyperdense vessel or unexpected calcification. Skull: Normal. Negative for fracture or focal lesion. Sinuses/Orbits: Status post bilateral cataract extraction. Unremarkable paranasal sinuses. Other: None. IMPRESSION: 1. No acute abnormality. 2. Moderate diffuse cerebral and cerebellar atrophy and mild chronic small vessel white matter ischemic changes in both cerebral hemispheres. Electronically Signed   By: Claudie Revering M.D.   On: 09/24/2018 19:01   Ct Chest Wo Contrast  Result Date: 09/25/2018 CLINICAL DATA:  Altered mental status, anemia, acute renal failure, thrombocytopenia. Widespread sclerotic osseous lesions, suspicious for metastatic disease. EXAM: CT CHEST WITHOUT CONTRAST TECHNIQUE: Multidetector CT imaging of the chest was performed following the standard protocol without IV contrast. COMPARISON:  Chest radiographs dated 09/24/2018. CTA chest dated 03/25/2016. FINDINGS: Cardiovascular: The heart is normal in size. No pericardial effusion. No evidence of thoracic aortic aneurysm. Atherosclerotic calcifications aortic root/arch. Three vessel coronary atherosclerosis. Mediastinum/Nodes: No suspicious mediastinal lymphadenopathy. Visualized thyroid is unremarkable. Lungs/Pleura: Biapical pleural-parenchymal scarring. Small right pleural effusion, partially loculated. Small left  pleural effusion. No focal consolidation. No pleural effusion or pneumothorax. Upper Abdomen: Unchanged from recent CT, noting mild perihepatic ascites. Musculoskeletal: Widespread/diffuse sclerotic osseous metastases throughout the visualized axial and appendicular skeleton, suggestive of metastatic prostate cancer. IMPRESSION: Widespread/diffuse sclerotic osseous metastases throughout the visualized axial and appendicular skeleton, suggestive of metastatic prostate cancer. Small bilateral pleural effusions, partially loculated on the right. Mild perihepatic ascites, incompletely visualized. Aortic Atherosclerosis (ICD10-I70.0). Electronically Signed   By: Julian Hy M.D.   On: 09/25/2018 23:59   Dg Chest Port 1 View  Result Date: 09/24/2018 CLINICAL DATA:  62 year old male with sepsis. EXAM: PORTABLE CHEST 1 VIEW COMPARISON:  Chest radiographs 04/20/2016 and earlier. FINDINGS: Pacer or resuscitation pads projecting over the central chest. Somewhat large lung volumes. Mediastinal contours  remain normal. Visualized tracheal air column is within normal limits. Allowing for portable technique the lungs are clear. No pneumothorax. Questionable heterogeneous bone mineralization throughout the visible chest and spine. This might be artifact due to technique. Paucity of bowel gas in the upper abdomen. IMPRESSION: 1.  No acute cardiopulmonary abnormality. 2. Questionable new generalized abnormal sclerosis in the visible ribs and spine. This may be artifact but sclerotic metastatic disease to bone is difficult to exclude. Electronically Signed   By: Genevie Ann M.D.   On: 09/24/2018 16:31    PERFORMANCE STATUS (ECOG) : 4 - Bedbound  Review of Systems Unable to provide.   Physical Exam General: Frail, ill-appearing Lungs: Clear to auscultation bilaterally. Heart: Regular rate and rhythm. Abdomen: Soft, nontender, nondistended.  Musculoskeletal: trace pedal edema Neuro: Alert, oriented to person only    Skin: petechiae to chest.  IMPRESSION: Patient remains in ICU. Hg downtrending again (7.5 -> 6.4). Anemia secondary to persistent hematuria.  SeCr continues to improve. Na has normalized.   Psych note reviewed and appreciated. Patient deemed not to have capacity for medical decision making. I have given son's name and possible address to SW. Phone number is unknown. Spoke with SW today. Recommend contacting police to see if we can locate son.   Will follow.   PLAN: 1. Continue supportive care 2. Will to try to locate children to serve as proxy decision makers  Time Total: 20 minutes  Visit consisted of counseling and education dealing with the complex and emotionally intense issues of symptom management and palliative care in the setting of serious and potentially life-threatening illness.Greater than 50%  of this time was spent counseling and coordinating care related to the above assessment and plan.  Signed by: Altha Harm, Pump Back, NP-C, [place] (Work Cell)

## 2018-09-28 NOTE — Progress Notes (Signed)
Pharmacy Electrolyte Monitoring Consult:  Pharmacy consulted to assist in monitoring and replacing electrolytes in this 62 y.o. male admitted on 09/24/2018 with Code Sepsis   Labs:  Sodium (mmol/L)  Date Value  09/28/2018 143   Potassium (mmol/L)  Date Value  09/28/2018 3.4 (L)   Magnesium (mg/dL)  Date Value  09/27/2018 1.8   Phosphorus (mg/dL)  Date Value  09/27/2018 3.5   Calcium (mg/dL)  Date Value  09/28/2018 6.8 (L)   Albumin (g/dL)  Date Value  09/24/2018 2.4 (L)    Assessment/Plan: Will give potassium 68mEq PO x1.   Will order electrolytes with morning labs.  Pharmacy to follow patient and replace electrolytes per consult.  Tawnya Crook, PharmD Pharmacy Resident  09/28/2018 11:31 AM

## 2018-09-29 ENCOUNTER — Encounter
Admission: EM | Disposition: A | Discharge status: Hospice | Payer: Self-pay | Source: Home / Self Care | Attending: Internal Medicine

## 2018-09-29 ENCOUNTER — Inpatient Hospital Stay: Payer: Medicaid Other

## 2018-09-29 ENCOUNTER — Encounter: Payer: Self-pay | Admitting: Anesthesiology

## 2018-09-29 DIAGNOSIS — N1330 Unspecified hydronephrosis: Secondary | ICD-10-CM

## 2018-09-29 DIAGNOSIS — N3289 Other specified disorders of bladder: Secondary | ICD-10-CM

## 2018-09-29 DIAGNOSIS — R310 Gross hematuria: Secondary | ICD-10-CM

## 2018-09-29 DIAGNOSIS — D649 Anemia, unspecified: Secondary | ICD-10-CM

## 2018-09-29 LAB — CBC WITH DIFFERENTIAL/PLATELET
BASOS ABS: 0 10*3/uL (ref 0–0.1)
Basophils Relative: 1 %
EOS PCT: 1 %
Eosinophils Absolute: 0 10*3/uL (ref 0–0.7)
HEMATOCRIT: 20.1 % — AB (ref 40.0–52.0)
Hemoglobin: 6.7 g/dL — ABNORMAL LOW (ref 13.0–18.0)
LYMPHS ABS: 0.5 10*3/uL — AB (ref 1.0–3.6)
LYMPHS PCT: 18 %
MCH: 28.4 pg (ref 26.0–34.0)
MCHC: 33.4 g/dL (ref 32.0–36.0)
MCV: 85 fL (ref 80.0–100.0)
MONO ABS: 0.1 10*3/uL — AB (ref 0.2–1.0)
MONOS PCT: 4 %
NEUTROS ABS: 2.1 10*3/uL (ref 1.4–6.5)
Neutrophils Relative %: 76 %
PLATELETS: 59 10*3/uL — AB (ref 150–440)
RBC: 2.36 MIL/uL — ABNORMAL LOW (ref 4.40–5.90)
RDW: 17.8 % — AB (ref 11.5–14.5)
WBC: 2.8 10*3/uL — ABNORMAL LOW (ref 3.8–10.6)

## 2018-09-29 LAB — MAGNESIUM: Magnesium: 1.6 mg/dL — ABNORMAL LOW (ref 1.7–2.4)

## 2018-09-29 LAB — BPAM PLATELET PHERESIS
BLOOD PRODUCT EXPIRATION DATE: 201910062359
UNIT TYPE AND RH: 5100

## 2018-09-29 LAB — BASIC METABOLIC PANEL
Anion gap: 5 (ref 5–15)
BUN: 40 mg/dL — AB (ref 8–23)
CHLORIDE: 117 mmol/L — AB (ref 98–111)
CO2: 21 mmol/L — AB (ref 22–32)
CREATININE: 1.58 mg/dL — AB (ref 0.61–1.24)
Calcium: 7 mg/dL — ABNORMAL LOW (ref 8.9–10.3)
GFR calc Af Amer: 53 mL/min — ABNORMAL LOW (ref >60)
GFR calc non Af Amer: 46 mL/min — ABNORMAL LOW (ref >60)
Glucose, Bld: 112 mg/dL — ABNORMAL HIGH (ref 70–99)
POTASSIUM: 3.7 mmol/L (ref 3.5–5.1)
SODIUM: 143 mmol/L (ref 135–145)

## 2018-09-29 LAB — CULTURE, BLOOD (ROUTINE X 2): CULTURE: NO GROWTH

## 2018-09-29 LAB — GLUCOSE, CAPILLARY: GLUCOSE-CAPILLARY: 80 mg/dL (ref 70–99)

## 2018-09-29 LAB — PREPARE PLATELET PHERESIS: Unit division: 0

## 2018-09-29 LAB — PREPARE RBC (CROSSMATCH)

## 2018-09-29 SURGERY — CYSTOSCOPY, WITH RETROGRADE PYELOGRAM AND URETERAL STENT INSERTION
Anesthesia: General

## 2018-09-29 MED ORDER — SODIUM CHLORIDE 0.9% IV SOLUTION
Freq: Once | INTRAVENOUS | Status: AC
  Administered 2018-09-29: 09:00:00 via INTRAVENOUS

## 2018-09-29 MED ORDER — MAGNESIUM SULFATE 2 GM/50ML IV SOLN
2.0000 g | Freq: Once | INTRAVENOUS | Status: AC
  Administered 2018-09-29: 2 g via INTRAVENOUS
  Filled 2018-09-29: qty 50

## 2018-09-29 MED ORDER — IOPAMIDOL (ISOVUE-300) INJECTION 61%
40.0000 mL | Freq: Once | INTRAVENOUS | Status: AC | PRN
  Administered 2018-09-29: 40 mL via URETHRAL

## 2018-09-29 MED ORDER — SODIUM CHLORIDE 0.9% IV SOLUTION: Freq: Once | INTRAVENOUS | Status: DC

## 2018-09-29 MED ORDER — CEFAZOLIN SODIUM-DEXTROSE 2-4 GM/100ML-% IV SOLN
2.0000 g | INTRAVENOUS | Status: DC
  Filled 2018-09-29: qty 100

## 2018-09-29 MED ORDER — SODIUM CHLORIDE 0.9 % IV SOLN
1.0000 mg/h | INTRAVENOUS | Status: DC
  Administered 2018-09-29: 1 mg/h via INTRAVENOUS
  Filled 2018-09-29: qty 4

## 2018-09-29 SURGICAL SUPPLY — 20 items
BAG DRAIN CYSTO-URO LG1000N (MISCELLANEOUS) ×3 IMPLANT
CATH FOL LX CONE TIP  8F (CATHETERS) ×2
CATH FOL LX CONE TIP 8F (CATHETERS) ×1 IMPLANT
CONRAY 43 FOR UROLOGY 50M (MISCELLANEOUS) ×3 IMPLANT
GLOVE BIO SURGEON STRL SZ7 (GLOVE) ×3 IMPLANT
GLOVE BIO SURGEON STRL SZ7.5 (GLOVE) ×3 IMPLANT
GOWN STRL REUS W/ TWL LRG LVL3 (GOWN DISPOSABLE) ×1 IMPLANT
GOWN STRL REUS W/ TWL XL LVL3 (GOWN DISPOSABLE) ×1 IMPLANT
GOWN STRL REUS W/TWL LRG LVL3 (GOWN DISPOSABLE) ×2
GOWN STRL REUS W/TWL XL LVL3 (GOWN DISPOSABLE) ×2
GUIDEWIRE STR ZIPWIRE 035X150 (MISCELLANEOUS) ×3 IMPLANT
NS IRRIG 500ML POUR BTL (IV SOLUTION) ×3 IMPLANT
PACK CYSTO AR (MISCELLANEOUS) ×3 IMPLANT
SET CYSTO W/LG BORE CLAMP LF (SET/KITS/TRAYS/PACK) ×3 IMPLANT
SOL .9 NS 3000ML IRR  AL (IV SOLUTION) ×2
SOL .9 NS 3000ML IRR UROMATIC (IV SOLUTION) ×1 IMPLANT
SOL PREP PVP 2OZ (MISCELLANEOUS) ×3
SOLUTION PREP PVP 2OZ (MISCELLANEOUS) ×1 IMPLANT
SURGILUBE 2OZ TUBE FLIPTOP (MISCELLANEOUS) ×3 IMPLANT
WATER STERILE IRR 1000ML POUR (IV SOLUTION) ×3 IMPLANT

## 2018-09-29 NOTE — Progress Notes (Signed)
Subjective: Patient reports a pain in his left side like he is passing a "cow".  Nurses called about 3 AM and said CBI had clotted off and was not running.  Appropriately they stopped the inflow and while I was talking to them got a few clots out and CBI began running again.  It seemed to stop draining again and I was called.  Again the appropriately stopped the inflow.  Bladder scan read 700.  Objective: Vital signs in last 24 hours: Temp:  [97.6 F (36.4 C)-98.2 F (36.8 C)] 97.7 F (36.5 C) (10/05 0200) Pulse Rate:  [80-105] 90 (10/05 0600) Resp:  [11-23] 15 (10/05 0600) BP: (90-117)/(55-81) 98/62 (10/05 0600) SpO2:  [97 %-100 %] 100 % (10/05 0600) Weight:  [67.6 kg] 67.6 kg (10/05 0438)  Intake/Output from previous day: 10/04 0701 - 10/05 0700 In: 48162.8 [I.V.:3952.8; Blood:290] Out: 16109 [[id number]] Intake/Output this shift: No intake/output data recorded.  Physical Exam:  No acute distress, but clutching at his penis Cardiovascular-regular rate and rhythm Respiratory effort was normal Abdomen-tense, guarding-this was during irrigation and Foley manipulation.  No obvious bladder distention or palpation on exam. GU-24 [place] hematuria catheter in place-when I arrived it was draining red urine and the CBI was off.  He had what looked like a paraphimosis and I reduced it 3 times but patient kept pulling the foreskin back.  Procedure: I tried to irrigate the catheter and it would not.  However, when reconnected to gravity it drained.  Still, would not irrigate and to be certain I deflated the balloon and removed it.  There was a clot obstructing the tip.  I then passed a new 24 [place] coud hematuria catheter without difficulty into the bladder and the balloon was filled with 10 cc and seated at the bladder neck.  This irrigated better but not very well.  It felt to me like it kept hitting the bladder wall and then again, connecting to gravity drainage was draining well.  I got a  few small clots but nothing significant.  I restarted CBI and that was running pink to almost clear.  Is Joseph Trevino with urology  Lab Results: Recent Labs    09/28/18 0020 09/28/18 0954 09/29/18 0431  HGB 6.4* 7.6* 6.7*  HCT 17.6* 21.5* 20.1*   BMET Recent Labs    09/28/18 0424 09/29/18 0431  NA 143 143  K 3.4* 3.7  CL 117* 117*  CO2 21* 21*  GLUCOSE 113* 112*  BUN 56* 40*  CREATININE 2.12* 1.58*  CALCIUM 6.8* 7.0*   Recent Labs    09/27/18 0612 09/28/18 0424  INR 1.30 1.23   No results for input(s): LABURIN in the last 72 hours. Results for orders placed or performed during the hospital encounter of 09/24/18  Blood Culture (routine x 2)     Status: None (Preliminary result)   Collection Time: 09/24/18  2:54 PM  Result Value Ref Range Status   Specimen Description BLOOD RIGHT AC  Final   Special Requests   Final    BOTTLES DRAWN AEROBIC AND ANAEROBIC Blood Culture results may not be optimal due to an excessive volume of blood received in culture bottles   Culture   Final    NO GROWTH 4 DAYS Performed at [facility], [address], [place], [place] [address]    Report Status PENDING  Incomplete  Urine culture     Status: None   Collection Time: 09/24/18  2:55 PM  Result Value Ref Range Status  Specimen Description   Final    URINE, RANDOM Performed at [facility], [address] [place], [place] [address]    Special Requests   Final    NONE Performed at [facility], [address], [place], [place] [address]    Culture   Final    NO GROWTH Performed at [facility], [place] [address] [place], [place] [address]    Report Status 09/26/2018 FINAL  Final  MRSA PCR Screening     Status: None   Collection Time: 09/24/18  8:43 PM  Result Value Ref Range Status   MRSA by PCR NEGATIVE NEGATIVE Final    Comment:        The GeneXpert MRSA Assay (FDA approved for NASAL specimens only), is one component of a comprehensive  MRSA colonization surveillance program. It is not intended to diagnose MRSA infection nor to guide or monitor treatment for MRSA infections. Performed at [facility], [address], [place], [place] [address]     Studies/Results: No results found.  Assessment/Plan: Gross hematuria- not that significant - CBI running intermittently. With the catheter not irrigating well and his abd tense, I'm concerned about bladder perforation. Could also be bladder spasm. Will get a CT to rule out significant clot and/or perforation. I made NPO for OR later today.   Anemia, thrombocytopenia - resuscitate for possible OR   Hydronephrosis - his creatinine continues to improve which is a good sign   Elevated PSA - bone mets - biopsy at a later   Discussed above with day nurse and CT tech.   Addendum: CT scan shows no perforation but large 7 x 10 cm clot in bladder.  Given requirement for transfusion, he is required 3 or 4 catheter changes and this clot will not irrigate will set up for operating room ASAP for cystoscopy possible TURP possible TURBT and possible bilateral retrograde pyelogram with ureteral stents.  He had hydronephrosis on the CT, but again that is with a distended bladder and his creatinine has improved.  We may not want to relegate him to having stents at this point. I spoke with nurse and OR.    LOS: 5 days   Festus Aloe 09/29/2018, 7:07 AM

## 2018-09-29 NOTE — Progress Notes (Signed)
Follow up - Critical Care Medicine Note  Patient Details:    Joseph Trevino is an 62 y.o. male.with multiple medical problems including COPD, tobacco and alcohol abuse, who was admitted on 9/30 with altered mental status after being found unresponsive in his hotel room, apparently surrounded by empty alcohol bottles and covered in stool.He initially refused transport to the ER but was involuntarily committed. Initial work-up revealed multiple metabolic derangements, acute renal failure, severe anemia with hematuria, and UTI. CT scan revealed widespread sclerotic osseous metastases likely in the setting of prostate cancer.  Lines, Airways, Drains: CVC Triple Lumen 09/24/18 Right Femoral (Active)  Indication for Insertion or Continuance of Line Prolonged intravenous therapies 09/28/2018  7:46 AM  Site Assessment Clean;Dry;Intact 09/27/2018  8:00 PM  Proximal Lumen Status Infusing;Flushed;Blood return noted 09/27/2018  8:00 PM  Medial Lumen Status Flushed;Saline locked;Blood return noted 09/27/2018  8:00 PM  Distal Lumen Status Flushed;Blood return noted;In-line blood sampling system in place 09/27/2018  8:00 PM  Dressing Type Transparent 09/27/2018  8:00 PM  Dressing Status Clean;Dry;Intact;Antimicrobial disc in place 09/27/2018  8:00 PM  Line Care Other (Comment) 09/28/2018  4:00 AM  Dressing Intervention Dressing changed 09/26/2018  8:00 AM  Dressing Change Due 10/03/18 09/27/2018  8:00 PM     Urethral Catheter Dr. Erlene Quan Coude;Triple-lumen 24 Fr. (Active)  Indication for Insertion or Continuance of Catheter Bladder outlet obstruction / other urologic reason 09/28/2018  2:00 AM  Site Assessment Clean;Intact 09/26/2018 10:00 PM  Catheter Maintenance Bag below level of bladder;Drainage bag/tubing not touching floor;No dependent loops;Seal intact 09/28/2018  2:00 AM  Collection Container Standard drainage bag 09/28/2018  2:00 AM  Securement Method Leg strap 09/28/2018  2:00 AM  Urinary Catheter  Interventions Irrigated 09/28/2018  2:00 AM  Output (mL) 1700 mL 09/28/2018  7:41 AM    Anti-infectives:  Anti-infectives (From admission, onward)   Start     Dose/Rate Route Frequency Ordered Stop   09/25/18 1800  cefTRIAXone (ROCEPHIN) 2 g in sodium chloride 0.9 % 100 mL IVPB     2 g 200 mL/hr over 30 Minutes Intravenous Daily-1800 09/25/18 0956 09/28/18 1815   09/25/18 0830  vancomycin (VANCOCIN) IVPB 1000 mg/200 mL premix  Status:  Discontinued     1,000 mg 200 mL/hr over 60 Minutes Intravenous every 72 hours 09/24/18 1938 09/25/18 0956   09/25/18 0000  ceFEPIme (MAXIPIME) 1 g in sodium chloride 0.9 % 100 mL IVPB  Status:  Discontinued     1 g 200 mL/hr over 30 Minutes Intravenous Every 24 hours 09/24/18 1731 09/24/18 2018   09/24/18 2100  cefTRIAXone (ROCEPHIN) 2 g in sodium chloride 0.9 % 100 mL IVPB  Status:  Discontinued     2 g 200 mL/hr over 30 Minutes Intravenous Every 24 hours 09/24/18 2018 09/25/18 0956   09/24/18 1936  vancomycin variable dose per unstable renal function (pharmacist dosing)  Status:  Discontinued      Does not apply See admin instructions 09/24/18 1938 09/25/18 1037   09/24/18 1600  ceFEPIme (MAXIPIME) 2 g in sodium chloride 0.9 % 100 mL IVPB     2 g 200 mL/hr over 30 Minutes Intravenous  Once 09/24/18 1556 09/24/18 1735   09/24/18 1600  metroNIDAZOLE (FLAGYL) IVPB 500 mg  Status:  Discontinued     500 mg 100 mL/hr over 60 Minutes Intravenous Every 8 hours 09/24/18 1556 09/24/18 2018   09/24/18 1600  vancomycin (VANCOCIN) IVPB 1000 mg/200 mL premix     1,000 mg 200  mL/hr over 60 Minutes Intravenous  Once 09/24/18 1556 09/24/18 1904      Microbiology: Results for orders placed or performed during the hospital encounter of 09/24/18  Blood Culture (routine x 2)     Status: None (Preliminary result)   Collection Time: 09/24/18  2:54 PM  Result Value Ref Range Status   Specimen Description BLOOD RIGHT AC  Final   Special Requests   Final    BOTTLES  DRAWN AEROBIC AND ANAEROBIC Blood Culture results may not be optimal due to an excessive volume of blood received in culture bottles   Culture   Final    NO GROWTH 4 DAYS Performed at [facility], [address] [place], [place] [address]    Report Status PENDING  Incomplete  Urine culture     Status: None   Collection Time: 09/24/18  2:55 PM  Result Value Ref Range Status   Specimen Description   Final    URINE, RANDOM Performed at [facility], [address], [place], [place] [address]    Special Requests   Final    NONE Performed at [facility], [address] [place], [place] [address]    Culture   Final    NO GROWTH Performed at [facility], [place] [address] [place], [place] [address]    Report Status 09/26/2018 FINAL  Final  MRSA PCR Screening     Status: None   Collection Time: 09/24/18  8:43 PM  Result Value Ref Range Status   MRSA by PCR NEGATIVE NEGATIVE Final    Comment:        The GeneXpert MRSA Assay (FDA approved for NASAL specimens only), is one component of a comprehensive MRSA colonization surveillance program. It is not intended to diagnose MRSA infection nor to guide or monitor treatment for MRSA infections. Performed at [facility], [address] [place] [place], [place] [address]    Studies: Ct Abdomen Pelvis Wo Contrast  Result Date: 09/29/2018 CLINICAL DATA:  Hematuria with continuous bladder irrigation and dysfunction of the patient's Foley catheter. EXAM: CT ABDOMEN AND PELVIS WITHOUT CONTRAST TECHNIQUE: Multidetector CT imaging of the abdomen and pelvis was performed following the standard protocol without IV contrast. COMPARISON:  09/24/2018. FINDINGS: Lower chest: Small left pleural effusion and small to moderate-sized right pleural effusion which appears at least partially loculated. Mild bilateral dependent atelectasis. Hepatobiliary: Mildly lobulated liver contours with a relatively small right lobe  and enlarged caudate lobe and lateral segment left lobe. Multiple small gallstones in the gallbladder, measuring up to 3 mm in maximum diameter each. No gallbladder wall thickening or pericholecystic fluid. Pancreas: Unremarkable. No pancreatic ductal dilatation or surrounding inflammatory changes. Spleen: Normal in size without focal abnormality. Adrenals/Urinary Tract: Normal appearing adrenal glands. Interval areas of increased density in the medullary regions of both kidneys. No discrete calculi seen. Moderate dilatation of both renal collecting systems with progression. Stable moderately dilated ureters to the level of the urinary bladder. Contrast is injected into the urinary bladder through the patient's Foley catheter. There is a rounded mass in the bladder with some interspersed air well as some free air in the bladder associated with catheterization. The rounded mass measures 7.4 cm in maximum diameter on image number 69 series 2 and 82 Hounsfield units in density. There are multiple small areas of similar density in the dependent portion of the bladder. The degree of bladder distention has not changed significantly. Stomach/Bowel: Unremarkable stomach, small bowel and colon. No  evidence of appendicitis. Vascular/Lymphatic: The previously demonstrated small infrarenal abdominal aortic aneurysm measuring 3.1 cm in maximum diameter is unchanged. Atheromatous arterial calcifications. No enlarged lymph nodes. Reproductive: Prostate is unremarkable. Other: Small to moderate amount of free peritoneal fluid, increased. Interval diffuse subcutaneous edema. Musculoskeletal: Stable previously demonstrated patchy sclerosis throughout the bony skeleton. IMPRESSION: 1. Large, rounded clot and smaller clots in the urinary bladder. 2. Stable bladder distention. 3. Moderate bilateral hydronephrosis and hydroureter with mild progression. 4. Interval small left pleural effusion and small to moderate-sized right pleural  effusion. The pleural effusion on the right appears at least partially loculated. 5. Mild bilateral lower lobe atelectasis. 6. Stable changes suggesting cirrhosis of the liver. 7. Interval diffuse anasarca. 8. Stable diffuse patchy bone sclerosis compatible with metastatic prostate cancer. 9. Cholelithiasis. 10. Stable 3.1 cm infrarenal abdominal aortic aneurysm. Recommend followup by ultrasound in 3 years. This recommendation follows ACR consensus guidelines: White Paper of the ACR Incidental Findings Committee II on Vascular Findings. Joellyn Rued Radiol 2013; [phone] Electronically Signed   By: Claudie Revering M.D.   On: 09/29/2018 08:34   Ct Abdomen Pelvis Wo Contrast  Result Date: 09/24/2018 CLINICAL DATA:  Altered mental status. Found on the floor. EXAM: CT ABDOMEN AND PELVIS WITHOUT CONTRAST TECHNIQUE: Multidetector CT imaging of the abdomen and pelvis was performed following the standard protocol without IV contrast. COMPARISON:  None. FINDINGS: Lower chest: Clear lung bases. Hepatobiliary: Multiple small gallstones in the dependent portion of the gallbladder, measuring up to 5 mm in maximum diameter each. No gallbladder wall thickening or pericholecystic fluid. Somewhat small liver with the exception of mildly prominent lateral segment left lobe and caudate lobe. The liver has mildly lobulated contours. Pancreas: Unremarkable. No pancreatic ductal dilatation or surrounding inflammatory changes. Spleen: Mildly prominent without splenomegaly. Adrenals/Urinary Tract: Mild to moderate dilatation of both renal collecting systems and moderate dilatation of both ureters to the level of the urinary bladder. Foley catheter in the bladder with associated air in the bladder. The bladder is distended, despite presence of the Foley catheter. No calculi seen. Normal appearing adrenal glands. Stomach/Bowel: Unremarkable stomach, small bowel and colon. No evidence of appendicitis. Vascular/Lymphatic: Atheromatous  calcifications. Small focal infrarenal abdominal aortic aneurysm measuring 3.1 cm in maximum diameter on image number 42 series 2. No enlarged lymph nodes. Reproductive: Prostate is unremarkable. Other: Minimal free peritoneal fluid adjacent to the liver. Small to moderate-sized umbilical hernia containing fat. Musculoskeletal: Diffuse patchy sclerosis of the bones. IMPRESSION: 1. Mild to moderate bilateral hydronephrosis and moderate bilateral hydroureter to the level of a mildly distended urinary bladder, despite presence of a Foley catheter. No obstructing calculi are seen. 2. Diffuse patchy bone sclerosis with appearance compatible with metastatic prostate cancer. 3. Cholelithiasis without evidence of cholecystitis. 4. Findings suggesting cirrhosis of the liver with minimal ascites. 5. 3.1 cm infrarenal abdominal aortic aneurysm. Recommend followup by ultrasound in 3 years. This recommendation follows ACR consensus guidelines: White Paper of the ACR Incidental Findings Committee II on Vascular Findings. Joellyn Rued Radiol 2013; [phone] Electronically Signed   By: Claudie Revering M.D.   On: 09/24/2018 19:10   Ct Head Wo Contrast  Result Date: 09/24/2018 CLINICAL DATA:  Altered mental status EXAM: CT HEAD WITHOUT CONTRAST TECHNIQUE: Contiguous axial images were obtained from the base of the skull through the vertex without intravenous contrast. COMPARISON:  None. FINDINGS: Brain: Diffusely enlarged ventricles and subarachnoid spaces. Patchy white matter low density in both cerebral hemispheres. No intracranial hemorrhage, mass lesion or  CT evidence of acute infarction. Vascular: No hyperdense vessel or unexpected calcification. Skull: Normal. Negative for fracture or focal lesion. Sinuses/Orbits: Status post bilateral cataract extraction. Unremarkable paranasal sinuses. Other: None. IMPRESSION: 1. No acute abnormality. 2. Moderate diffuse cerebral and cerebellar atrophy and mild chronic small vessel white matter  ischemic changes in both cerebral hemispheres. Electronically Signed   By: Claudie Revering M.D.   On: 09/24/2018 19:01   Ct Chest Wo Contrast  Result Date: 09/25/2018 CLINICAL DATA:  Altered mental status, anemia, acute renal failure, thrombocytopenia. Widespread sclerotic osseous lesions, suspicious for metastatic disease. EXAM: CT CHEST WITHOUT CONTRAST TECHNIQUE: Multidetector CT imaging of the chest was performed following the standard protocol without IV contrast. COMPARISON:  Chest radiographs dated 09/24/2018. CTA chest dated 03/25/2016. FINDINGS: Cardiovascular: The heart is normal in size. No pericardial effusion. No evidence of thoracic aortic aneurysm. Atherosclerotic calcifications aortic root/arch. Three vessel coronary atherosclerosis. Mediastinum/Nodes: No suspicious mediastinal lymphadenopathy. Visualized thyroid is unremarkable. Lungs/Pleura: Biapical pleural-parenchymal scarring. Small right pleural effusion, partially loculated. Small left pleural effusion. No focal consolidation. No pleural effusion or pneumothorax. Upper Abdomen: Unchanged from recent CT, noting mild perihepatic ascites. Musculoskeletal: Widespread/diffuse sclerotic osseous metastases throughout the visualized axial and appendicular skeleton, suggestive of metastatic prostate cancer. IMPRESSION: Widespread/diffuse sclerotic osseous metastases throughout the visualized axial and appendicular skeleton, suggestive of metastatic prostate cancer. Small bilateral pleural effusions, partially loculated on the right. Mild perihepatic ascites, incompletely visualized. Aortic Atherosclerosis (ICD10-I70.0). Electronically Signed   By: Julian Hy M.D.   On: 09/25/2018 23:59   Dg Chest Port 1 View  Result Date: 09/24/2018 CLINICAL DATA:  62 year old male with sepsis. EXAM: PORTABLE CHEST 1 VIEW COMPARISON:  Chest radiographs 04/20/2016 and earlier. FINDINGS: Pacer or resuscitation pads projecting over the central chest. Somewhat  large lung volumes. Mediastinal contours remain normal. Visualized tracheal air column is within normal limits. Allowing for portable technique the lungs are clear. No pneumothorax. Questionable heterogeneous bone mineralization throughout the visible chest and spine. This might be artifact due to technique. Paucity of bowel gas in the upper abdomen. IMPRESSION: 1.  No acute cardiopulmonary abnormality. 2. Questionable new generalized abnormal sclerosis in the visible ribs and spine. This may be artifact but sclerotic metastatic disease to bone is difficult to exclude. Electronically Signed   By: Genevie Ann M.D.   On: 09/24/2018 16:31    Consults: Treatment Team:  Anthonette Legato, MD Pccm, Ander Gaster, MD Clovis Fredrickson, MD Ardis Hughs, MD Lloyd Huger, MD Abbie Sons, MD Clapacs, Madie Reno, MD Hollice Espy, MD   Subjective:    Overnight Issues: Patient with continuous bladder irrigation clotted off, concern for possible perforation, pending results of CT abdomen pelvis and follow-up with urology.  Patient's hemoglobin and platelet count have decreased, pending transfusion in case he would need an operative procedure  Objective:  Vital signs for last 24 hours: Temp:  [97.6 F (36.4 C)-98.2 F (36.8 C)] 97.7 F (36.5 C) (10/05 0200) Pulse Rate:  [80-105] 90 (10/05 0600) Resp:  [11-23] 15 (10/05 0600) BP: (90-117)/(55-81) 98/62 (10/05 0600) SpO2:  [97 %-100 %] 100 % (10/05 0600) Weight:  [67.6 kg] 67.6 kg (10/05 0438)  Hemodynamic parameters for last 24 hours:    Intake/Output from previous day: 10/04 0701 - 10/05 0700 In: 97026.3 [I.V.:3952.8; Blood:290] Out: 78588 [[id number]]  Intake/Output this shift: No intake/output data recorded.  Vent settings for last 24 hours:    Physical Exam:  Vital signs: Please see the above listed vital signs HEENT:  Trachea midline, no accessory muscle utilization, no thyromegaly appreciated Cardiovascular:  Tachycardia regular rate and rhythm Pulmonary: Clear to auscultation Abdominal: Hypoactive bowel sounds, patient is complaining of diffuse pain some guarding to palpation but no rebound Extremities: No clubbing, cyanosis or edema noted Foley catheter in place with continuous bladder irrigation clotted  Assessment/Plan:   Widespread bony metastatic disease most likely secondary to presumptive prostate cancer.  Is being followed by oncology, urology, nephrology and palliative care.  Is requiring continuous bladder irrigation with frequent transfusions secondary to recurrent anemia.  Patient with abdominal pain and clotted off CBI, pending CT scan of the abdomen to rule out bladder perforation, being followed by urology.  Will transfuse 2 units of packed red blood cells and a unit of platelets  Underlying history of EtOH abuse with cirrhosis.  Is at risk for DTs, is on CIWA protocol.  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  Pancytopenia.  Transfuse as needed  Hypokalemia.  Will replace as needed  Renal failure.  Significant improvement since admission BUN 40 / creatinine 1.58  We will need to discuss goals of care with family, appreciate palliative care and case management assistance.    Kierstin January 09/29/2018  *Care during the described time interval was provided by me and/or other providers on the critical care team.  I have reviewed this patient's available data, including medical history, events of note, physical examination and test results as part of my evaluation. Patient ID: Joseph Trevino, male   DOB: [birth year], 62 y.o.   MRN: [id number]

## 2018-09-29 NOTE — Progress Notes (Signed)
[facility]  ROUNDING NOTE   Subjective:  Patient seen at bedside. Appears critically ill. Still having bleeding into his bladder. Daughter has opted for comfort care.  Objective:  Vital signs in last 24 hours:  Temp:  [97.6 F (36.4 C)-99.1 F (37.3 C)] 99.1 F (37.3 C) (10/05 0900) Pulse Rate:  [80-117] 101 (10/05 0930) Resp:  [11-30] 14 (10/05 0930) BP: (90-121)/(55-79) 99/67 (10/05 0930) SpO2:  [97 %-100 %] 99 % (10/05 0930) Weight:  [67.6 kg] 67.6 kg (10/05 0438)  Weight change: 2.1 kg Filed Weights   09/27/18 0453 09/28/18 0438 09/29/18 0438  Weight: 65.5 kg 65.5 kg 67.6 kg    Intake/Output: I/O last 3 completed shifts: In: 84642.9 [I.V.:4202.9; Blood:520; WUJWJ:19147] Out: (936) 145-4544 [Urine:80595]   Intake/Output this shift:  No intake/output data recorded.  Physical Exam: General: Critically ill appearing  Head: Normocephalic, atraumatic. moist oral mucosal membranes  Eyes: Anicteric  Neck: Supple, trachea midline  Lungs:  Clear to auscultation, normal effort  Heart: S1S2 no rubs  Abdomen:  Soft, lower abdominal distension noted  Extremities: no peripheral edema.  Neurologic: Lethargic but arousable  Skin: B/L UE ecchymoses       Basic Metabolic Panel: Recent Labs  Lab 09/25/18 2036  09/26/18 0814  09/26/18 1809 09/27/18 0447 09/27/18 1004 09/27/18 1415 09/27/18 1757 09/28/18 0424 09/29/18 0431  NA 151*   < > 152*   < > 148* 146* 147* 145 144 143 143  K 3.4*  --  3.0*  --  3.6 3.4*  --   --   --  3.4* 3.7  CL 125*  --  125*  --   --  120*  --   --   --  117* 117*  CO2 20*  --  20*  --   --  20*  --   --   --  21* 21*  GLUCOSE 167*  --  144*  --   --  136*  --   --   --  113* 112*  BUN 114*  --  100*  --   --  79*  --   --   --  56* 40*  CREATININE 3.89*  --  3.72*  --   --  3.02*  --   --   --  2.12* 1.58*  CALCIUM 7.5*  --  7.6*  --   --  7.2*  --   --   --  6.8* 7.0*  MG  --   --  1.8  --   --  1.8  --   --   --   --  1.6*  PHOS   --   --  3.6  --   --  3.5  --   --   --   --   --    < > = values in this interval not displayed.    Liver Function Tests: Recent Labs  Lab 09/24/18 1454 09/24/18 1931  AST 48* 63*  ALT 11 17  ALKPHOS 179* 155*  BILITOT 1.9* 1.8*  PROT 6.7 6.4*  ALBUMIN 2.5* 2.4*   No results for input(s): LIPASE, AMYLASE in the last 168 hours. Recent Labs  Lab 09/24/18 1931  AMMONIA 42*    CBC: Recent Labs  Lab 09/24/18 1931  09/26/18 0814 09/26/18 1809 09/27/18 0447 09/27/18 1004 09/27/18 1415 09/28/18 0020 09/28/18 0954 09/29/18 0431  WBC 5.6   < > 7.5 5.0 4.6 5.0  --  3.2* 4.1 2.8*  NEUTROABS 4.5  --  5.7 3.8  --  3.9  --   --   --  2.1  HGB 7.1*   < > 6.7* 7.6* 6.3* 7.5*  --  6.4* 7.6* 6.7*  HCT 21.7*   < > 19.2* 22.0* 17.8* 21.1*  --  17.6* 21.5* 20.1*  MCV 83.5   < > 82.9 81.8 79.9* 82.5  --  81.5 83.4 85.0  PLT 38*   < > 68* 37* 57* 54* 80* 94* 78* 59*   < > = values in this interval not displayed.    Cardiac Enzymes: Recent Labs  Lab 09/24/18 1454 09/25/18 0442  CKTOTAL 73 43*  TROPONINI 0.05*  --     BNP: Invalid input(s): POCBNP  CBG: Recent Labs  Lab 09/28/18 1140 09/28/18 1549 09/28/18 1953 09/28/18 2244 09/29/18 0849  GLUCAP 103* 109* 102* 97 80    Microbiology: Results for orders placed or performed during the hospital encounter of 09/24/18  Blood Culture (routine x 2)     Status: None   Collection Time: 09/24/18  2:54 PM  Result Value Ref Range Status   Specimen Description BLOOD RIGHT AC  Final   Special Requests   Final    BOTTLES DRAWN AEROBIC AND ANAEROBIC Blood Culture results may not be optimal due to an excessive volume of blood received in culture bottles   Culture   Final    NO GROWTH 5 DAYS Performed at [facility], [address] [place], [place] [address]    Report Status 09/29/2018 FINAL  Final  Urine culture     Status: None   Collection Time: 09/24/18  2:55 PM  Result Value Ref Range Status   Specimen  Description   Final    URINE, RANDOM Performed at [facility], [address] [place], [place] [address]    Special Requests   Final    NONE Performed at [facility], [address] [place], [place] [address]    Culture   Final    NO GROWTH Performed at [facility], [place] [address] [place], [place] [address]    Report Status 09/26/2018 FINAL  Final  MRSA PCR Screening     Status: None   Collection Time: 09/24/18  8:43 PM  Result Value Ref Range Status   MRSA by PCR NEGATIVE NEGATIVE Final    Comment:        The GeneXpert MRSA Assay (FDA approved for NASAL specimens only), is one component of a comprehensive MRSA colonization surveillance program. It is not intended to diagnose MRSA infection nor to guide or monitor treatment for MRSA infections. Performed at [facility], [address] [place], [place] [address]     Coagulation Studies: Recent Labs    09/27/18 0612 09/28/18 0424  LABPROT 16.1* 15.4*  INR 1.30 1.23    Urinalysis: No results for input(s): COLORURINE, LABSPEC, PHURINE, GLUCOSEU, HGBUR, BILIRUBINUR, KETONESUR, PROTEINUR, UROBILINOGEN, NITRITE, LEUKOCYTESUR in the last 72 hours.  Invalid input(s): APPERANCEUR    Imaging: Ct Abdomen Pelvis Wo Contrast  Result Date: 09/29/2018 CLINICAL DATA:  Hematuria with continuous bladder irrigation and dysfunction of the patient's Foley catheter. EXAM: CT ABDOMEN AND PELVIS WITHOUT CONTRAST TECHNIQUE: Multidetector CT imaging of the abdomen and pelvis was performed following the standard protocol without IV contrast. COMPARISON:  09/24/2018. FINDINGS: Lower chest: Small left pleural effusion and small to moderate-sized right pleural effusion which appears at least partially loculated. Mild bilateral dependent atelectasis. Hepatobiliary: Mildly  lobulated liver contours with a relatively small right lobe and enlarged caudate lobe and lateral segment left lobe. Multiple small  gallstones in the gallbladder, measuring up to 3 mm in maximum diameter each. No gallbladder wall thickening or pericholecystic fluid. Pancreas: Unremarkable. No pancreatic ductal dilatation or surrounding inflammatory changes. Spleen: Normal in size without focal abnormality. Adrenals/Urinary Tract: Normal appearing adrenal glands. Interval areas of increased density in the medullary regions of both kidneys. No discrete calculi seen. Moderate dilatation of both renal collecting systems with progression. Stable moderately dilated ureters to the level of the urinary bladder. Contrast is injected into the urinary bladder through the patient's Foley catheter. There is a rounded mass in the bladder with some interspersed air well as some free air in the bladder associated with catheterization. The rounded mass measures 7.4 cm in maximum diameter on image number 69 series 2 and 82 Hounsfield units in density. There are multiple small areas of similar density in the dependent portion of the bladder. The degree of bladder distention has not changed significantly. Stomach/Bowel: Unremarkable stomach, small bowel and colon. No evidence of appendicitis. Vascular/Lymphatic: The previously demonstrated small infrarenal abdominal aortic aneurysm measuring 3.1 cm in maximum diameter is unchanged. Atheromatous arterial calcifications. No enlarged lymph nodes. Reproductive: Prostate is unremarkable. Other: Small to moderate amount of free peritoneal fluid, increased. Interval diffuse subcutaneous edema. Musculoskeletal: Stable previously demonstrated patchy sclerosis throughout the bony skeleton. IMPRESSION: 1. Large, rounded clot and smaller clots in the urinary bladder. 2. Stable bladder distention. 3. Moderate bilateral hydronephrosis and hydroureter with mild progression. 4. Interval small left pleural effusion and small to moderate-sized right pleural effusion. The pleural effusion on the right appears at least partially  loculated. 5. Mild bilateral lower lobe atelectasis. 6. Stable changes suggesting cirrhosis of the liver. 7. Interval diffuse anasarca. 8. Stable diffuse patchy bone sclerosis compatible with metastatic prostate cancer. 9. Cholelithiasis. 10. Stable 3.1 cm infrarenal abdominal aortic aneurysm. Recommend followup by ultrasound in 3 years. This recommendation follows ACR consensus guidelines: White Paper of the ACR Incidental Findings Committee II on Vascular Findings. Joellyn Rued Radiol 2013; [phone] Electronically Signed   By: Claudie Revering M.D.   On: 09/29/2018 08:34     Medications:   .  ceFAZolin (ANCEF) IV    . dexmedetomidine (PRECEDEX) IV infusion Stopped (09/25/18 1145)  . dextrose 125 mL/hr at 09/28/18 1926   . docusate sodium  100 mg Oral BID  . feeding supplement (ENSURE ENLIVE)  237 mL Oral BID BM  . folic acid  1 mg Intravenous Daily  . insulin aspart  0-9 Units Subcutaneous TID WC  . lidocaine  1 application Urethral Once  . multivitamin with minerals  1 tablet Oral Daily  . pantoprazole  40 mg Intravenous Q12H  . sodium chloride flush  10-40 mL Intracatheter Q12H  . thiamine injection  100 mg Intravenous Daily  . vitamin C  500 mg Oral BID   LORazepam, morphine injection, ondansetron **OR** ondansetron (ZOFRAN) IV, opium-belladonna, sodium chloride flush  Assessment/ Plan:  62 y.o. male with a PMHx of COPD, tobacco abuse, who was admitted to [facility] on 09/24/2018 for evaluation of altered mental status.  It appears that the patient has been residing at a local hotel for approximately 3 months and was abusing ETOH.  Found to have severe metabolic deranagements at admission.   1.  Acute renal failure.  2.  Hypernatremia. 3.  Urinary retention/bilateral hydronephrosis.  4.  Severe anemia.   5.  Metabolic/lactic  acidosis.   Plan: Renal function has improved over the course of the hospitalization.  Creatinine now down to 1.58.  However he remains critically ill and continues  to bleed into the bladder.  Case was discussed in depth with urology.  They discussed the case with the patient's daughter and it appears that they will opt for comfort care.  No further blood to be administered.  This appears to be a reasonable course of action.  Thanks for allowing [place] to participate.   LOS: 5 Quentina Fronek 10/5/201910:25 AM

## 2018-09-29 NOTE — Anesthesia Preprocedure Evaluation (Deleted)
Anesthesia Evaluation  Patient identified by MRN, date of birth, ID band Patient awake    Reviewed: Allergy & Precautions, H&P , NPO status , Patient's Chart, lab work & pertinent test results  Airway        Dental   Pulmonary neg pulmonary ROS, shortness of breath, COPD, Current Smoker,           Cardiovascular      Neuro/Psych PSYCHIATRIC DISORDERS negative neurological ROS     GI/Hepatic negative GI ROS, Neg liver ROS, (+) Cirrhosis     substance abuse  alcohol use,   Endo/Other  negative endocrine ROS  Renal/GU ARFRenal disease     Musculoskeletal   Abdominal   Peds  Hematology  (+) Blood dyscrasia, anemia , Anemia, thrombocytopenia, receiving pRBC and platelets   Anesthesia Other Findings COPD, tobacco and alcohol abuse, who was admitted on 9/30 with AMS found to have multiple metabolic derangements, acute renal failure, severe anemia with hematuria, and UTI.  CT scan revealed widespread sclerotic osseous metastases likely in the setting of prostate cancer. Hematuria requiring continuous bladder irrigation, now with CBI circuit clotted off and foley catheter minimally draining, posted for stent placement and evacuation of large clot in bladder.  Past Medical History: No date: COPD (chronic obstructive pulmonary disease) (HCC) No date: Tobacco abuse  History reviewed. No pertinent surgical history.  BMI    Body Mass Index:  23.34 kg/m      Reproductive/Obstetrics negative OB ROS                             Anesthesia Physical Anesthesia Plan  ASA: IV  Anesthesia Plan: General ETT   Post-op Pain Management:    Induction:   PONV Risk Score and Plan: Ondansetron and Dexamethasone  Airway Management Planned:   Additional Equipment:   Intra-op Plan:   Post-operative Plan:   Informed Consent: I have reviewed the patients History and Physical, chart, labs and discussed  the procedure including the risks, benefits and alternatives for the proposed anesthesia with the patient or authorized representative who has indicated his/her understanding and acceptance.   Dental Advisory Given  Plan Discussed with: Anesthesiologist, CRNA and Surgeon  Anesthesia Plan Comments:         Anesthesia Quick Evaluation

## 2018-09-29 NOTE — Progress Notes (Signed)
Pt transferred from ICU to RM 118 for comfort care. Morphine drip continued with pt resting quietly in bed. Assessment done.

## 2018-09-29 NOTE — Consult Note (Signed)
Pharmacy Antibiotic Note  Pharmacy consulted for cefazolin pre-op dosing for Joseph Trevino a 62 y.o. Scheduled for ASAP  cystoscopy possible TURP possible TURBT and possible bilateral retrograde pyelogram with ureteral stents  Plan: Will order Cefazolin 2g IV 30 min prior to surgery.   Height: 5\' 7"  (170.2 cm) Weight: 149 lb 0.5 oz (67.6 kg) IBW/kg (Calculated) : 66.1  Temp (24hrs), Avg:98.1 F (36.7 C), Min:97.6 F (36.4 C), Max:99.1 F (37.3 C)  Recent Labs  Lab 09/24/18 1454 09/24/18 1931 09/24/18 2245  09/25/18 2036 09/25/18 2145  09/26/18 0814  09/27/18 0447 09/27/18 1004 09/28/18 0020 09/28/18 0424 09/28/18 0954 09/29/18 0431  WBC 9.9 5.6  --    < >  --   --    < > 7.5   < > 4.6 5.0 3.2*  --  4.1 2.8*  CREATININE 8.04* 6.66* 5.68*   < > 3.89*  --   --  3.72*  --  3.02*  --   --  2.12*  --  1.58*  LATICACIDVEN 8.4* 4.5* 2.8*  --   --  1.8  --   --   --   --   --   --   --   --   --    < > = values in this interval not displayed.    Estimated Creatinine Clearance: 45.9 mL/min (A) (by C-G formula based on SCr of 1.58 mg/dL (H)).    No Known Allergies  Thank you for allowing pharmacy to be a part of this patient's care.  Pernell Dupre, PharmD, BCPS Clinical Pharmacist 09/29/2018 9:22 AM

## 2018-09-29 NOTE — Progress Notes (Signed)
Patient ID: Joseph Trevino, male   DOB: 08-25-1956, 62 y.o.   MRN: [id number]  Pulmonary/critical care attending  Discussed patient's care with daughter.  Related patient's presumptive diagnosis of prostate cancer with wide metastases, continued bleeding from bladder requiring continuous bladder irrigation and frequent multiple transfusions.  Daughter states that he has had a difficult life, with heavy alcohol abuse, poor quality of life, and would not want aggressive interventions at this time.  Wishes [place] to proceed with more of conservative/comfort measures.  Daughter also spoke with urology.  See documentation  Hermelinda Dellen, DO

## 2018-09-29 NOTE — Progress Notes (Addendum)
Pt was complaining of not being able to pee. Did try both irrigation for his foley but not pee coming out. Adminstered opium-belladonna ( [employer] SUPPRESS) 16.2-60 MG suppository 1 dose. Page prime. Will continue to monitor.  Update 2012: Doctor Pyreddy called but no new order was place due to pt on comfort care. Will continue to monitor.  Update 2112: Pt was resting in bed with no complaints. Morphine drip is continued. Will continue to monitor.  Update 0000: Pt is resting comfortably with no complaints. Morphine drip is continued. Will continue to monitor.  Update 0200: Pt resting comfortably with no complaints. Morphine drip continued. Will continue to monitor.  6812: Pt was changed and cleaned and resting comfortable with no complaints. Morphine drip continued. Will continue to monitor.

## 2018-09-29 NOTE — Progress Notes (Signed)
Patient transferred to room 118. Report called to Opal Sidles RN on 1C. Patient's daughter called and updated about transfer. No belongings found in room to send with patient. Patient with no complaints of pain or discomfort after moved to new bed.

## 2018-09-29 NOTE — Progress Notes (Signed)
Dr. Jefferson Fuel was able to get in touch with the patient's daughter and she requested comfort care only.  I called and spoke to her also and discussed with her the CT findings.  Discussed the reason for CBI and why we could not run CBI with a large bladder clot.  We discussed the nature risks and benefits of taking her dad to the operating room for cystoscopy with clot evacuation and depending on what we found stopping any bleeding with possible TURP or TURBT and/or having to reinstitute CBI.  We discussed if we did not do this we would just leave the bladder to gravity drainage and he could develop continued hemorrhage/bleeding, renal failure, bladder perforation and peritonitis all of which will be life-threatening.  All questions answered.  She emphasized again comfort care measures only and no transfusions or operative procedures. Mr. Mcpartlin is resting comfortably with the catheter to gravity drainage and we will leave it like that. I spoke with Dr. Jefferson Fuel and patient's nurse. I called OR and canceled procedure.

## 2018-09-29 NOTE — Plan of Care (Signed)
  Problem: Pain Managment: Goal: General experience of comfort will improve Outcome: Progressing   

## 2018-09-29 NOTE — Progress Notes (Signed)
[facility] NAME: Joseph Trevino    MR#:  [id number]  DATE OF BIRTH:  1956-12-03  SUBJECTIVE:   Patient appears intermittently confused. He knows he is at [facility]. He has no insight into his medical problem. CBI was started since patient is having persistent hematuria Daughter made him comfort care today REVIEW OF SYSTEMS:   Review of Systems  Unable to perform ROS: Medical condition   Tolerating Diet:yes Tolerating PT: pending  DRUG ALLERGIES:  No Known Allergies  VITALS:  Blood pressure 114/62, pulse 92, temperature 98.9 F (37.2 C), temperature source Oral, resp. rate 12, height 5\' 7"  (1.702 m), weight 67.6 kg, SpO2 99 %.  PHYSICAL EXAMINATION:   Physical Exam  GENERAL:  62 y.o.-year-old patient lying in the bed with no acute distress. Malnourished EYES: Pupils equal, round, reactive to light and accommodation. No scleral icterus. Extraocular muscles intact.  HEENT: Head atraumatic, normocephalic. Oropharynx and nasopharynx clear.  NECK:  Supple, no jugular venous distention. No thyroid enlargement, no tenderness.  LUNGS: Normal breath sounds bilaterally, no wheezing, rales, rhonchi. No use of accessory muscles of respiration.  CARDIOVASCULAR: S1, S2 normal. No murmurs, rubs, or gallops.  ABDOMEN: Soft, nontender, nondistended. Bowel sounds present. No organomegaly or mass. Foley catheter with red tinged urine EXTREMITIES: No cyanosis, clubbing or edema b/l.    NEUROLOGIC: Cranial nerves II through XII are intact. No focal Motor or sensory deficits b/l.   PSYCHIATRIC:  patient is alert and awake. SKIN: No obvious rash, lesion, or ulcer. Bruises of several staging both upper extremities  LABORATORY PANEL:  CBC Recent Labs  Lab 09/29/18 0431  WBC 2.8*  HGB 6.7*  HCT 20.1*  PLT 59*    Chemistries  Recent Labs  Lab 09/24/18 1931  09/29/18 0431  NA 161*   < > 143  K 4.0   < > 3.7  CL >130*   < > 117*   CO2 17*   < > 21*  GLUCOSE 179*   < > 112*  BUN 155*   < > 40*  CREATININE 6.66*   < > 1.58*  CALCIUM 8.4*   < > 7.0*  MG  --    < > 1.6*  AST 63*  --   --   ALT 17  --   --   ALKPHOS 155*  --   --   BILITOT 1.8*  --   --    < > = values in this interval not displayed.   Cardiac Enzymes Recent Labs  Lab 09/24/18 1454  TROPONINI 0.05*   RADIOLOGY:  Ct Abdomen Pelvis Wo Contrast  Result Date: 09/29/2018 CLINICAL DATA:  Hematuria with continuous bladder irrigation and dysfunction of the patient's Foley catheter. EXAM: CT ABDOMEN AND PELVIS WITHOUT CONTRAST TECHNIQUE: Multidetector CT imaging of the abdomen and pelvis was performed following the standard protocol without IV contrast. COMPARISON:  09/24/2018. FINDINGS: Lower chest: Small left pleural effusion and small to moderate-sized right pleural effusion which appears at least partially loculated. Mild bilateral dependent atelectasis. Hepatobiliary: Mildly lobulated liver contours with a relatively small right lobe and enlarged caudate lobe and lateral segment left lobe. Multiple small gallstones in the gallbladder, measuring up to 3 mm in maximum diameter each. No gallbladder wall thickening or pericholecystic fluid. Pancreas: Unremarkable. No pancreatic ductal dilatation or surrounding inflammatory changes. Spleen: Normal in size without focal abnormality. Adrenals/Urinary Tract: Normal appearing adrenal glands. Interval areas of increased density in the medullary  regions of both kidneys. No discrete calculi seen. Moderate dilatation of both renal collecting systems with progression. Stable moderately dilated ureters to the level of the urinary bladder. Contrast is injected into the urinary bladder through the patient's Foley catheter. There is a rounded mass in the bladder with some interspersed air well as some free air in the bladder associated with catheterization. The rounded mass measures 7.4 cm in maximum diameter on image number 69  series 2 and 82 Hounsfield units in density. There are multiple small areas of similar density in the dependent portion of the bladder. The degree of bladder distention has not changed significantly. Stomach/Bowel: Unremarkable stomach, small bowel and colon. No evidence of appendicitis. Vascular/Lymphatic: The previously demonstrated small infrarenal abdominal aortic aneurysm measuring 3.1 cm in maximum diameter is unchanged. Atheromatous arterial calcifications. No enlarged lymph nodes. Reproductive: Prostate is unremarkable. Other: Small to moderate amount of free peritoneal fluid, increased. Interval diffuse subcutaneous edema. Musculoskeletal: Stable previously demonstrated patchy sclerosis throughout the bony skeleton. IMPRESSION: 1. Large, rounded clot and smaller clots in the urinary bladder. 2. Stable bladder distention. 3. Moderate bilateral hydronephrosis and hydroureter with mild progression. 4. Interval small left pleural effusion and small to moderate-sized right pleural effusion. The pleural effusion on the right appears at least partially loculated. 5. Mild bilateral lower lobe atelectasis. 6. Stable changes suggesting cirrhosis of the liver. 7. Interval diffuse anasarca. 8. Stable diffuse patchy bone sclerosis compatible with metastatic prostate cancer. 9. Cholelithiasis. 10. Stable 3.1 cm infrarenal abdominal aortic aneurysm. Recommend followup by ultrasound in 3 years. This recommendation follows ACR consensus guidelines: White Paper of the ACR Incidental Findings Committee II on Vascular Findings. Joellyn Rued Radiol 2013; [id number] Electronically Signed   By: Claudie Revering M.D.   On: 09/29/2018 08:34   ASSESSMENT AND PLAN:  62 year old admitted with severe anemia acute renal failure hypothermia  * septic shock --source appears urine   *Acute Severe anemia -- multifactorial Suspected due to gross hematuria , poor nutrition, anemia of chronic disease, prostate cancer, [place] secondary  to chronic alcoholism *Acute gross hematuria appreciate urology input.  *Acute renal failure  *Acute severe hypernatremia due to severe dehydration  *Chronic alcoholism with cirrhosis, thrombocytopenia, electrolyte abnormalities *Acute newly diagnosed atrial fibrillation Unknown duration  *Acute incidentally found sclerotic bony lesions -Suspicious for Prostate cancer with metastatic disease -Oncology input appreciated-PSA greater than 65 suspicious for metastatic prostate cancer  Patient is being followed by palliative care given his very poor prognosis. Given his chronic alcoholism and poor overall health condition not sure if patient has ability to make decision. He is estranged from his family/son and daughter for several years.  -Patient evaluated by Dr. Weber Cooks. He is deemed not competent in making medical decisions  Daughter-healthcare POA made him comfort care today patient is started on morphine drip  Long-term prognosis is poor-palliative care consultation recommended  Case discussed with Care Management/Social Worker. Management plans discussed with the patient, family and they are in agreement.  CODE STATUS: full  DVT Prophylaxis: scd  TOTAL TIME TAKING CARE OF THIS PATIENT: *28* minutes.  >50% time spent on counselling and coordination of care  POSSIBLE D/C IN few DAYS, DEPENDING ON CLINICAL CONDITION.  Note: This dictation was prepared with Dragon dictation along with smaller phrase technology. Any transcriptional errors that result from this process are unintentional.  Nicholes Mango M.D on 09/29/2018 at 3:10 PM  Between 7am to 6pm - Pager - [phone]  After 6pm go to [email or web address] - [place]  [place]  [phone]  CC: Primary care physician; Patient, No Pcp PerPatient ID: Joseph Trevino, male   DOB: 04/14/1956, 62 y.o.   MRN: [id number]

## 2018-09-29 NOTE — Progress Notes (Signed)
Pharmacy Electrolyte Monitoring Consult:  Pharmacy consulted to assist in monitoring and replacing electrolytes in this 62 y.o. male admitted on 09/24/2018 with Code Sepsis   Labs:  Sodium (mmol/L)  Date Value  09/29/2018 143   Potassium (mmol/L)  Date Value  09/29/2018 3.7   Magnesium (mg/dL)  Date Value  09/29/2018 1.6 (L)   Phosphorus (mg/dL)  Date Value  09/27/2018 3.5   Calcium (mg/dL)  Date Value  09/29/2018 7.0 (L)   Albumin (g/dL)  Date Value  09/24/2018 2.4 (L)    Assessment/Plan: All within normal limits except Magnesium slightly low; however has been replaced.   Pharmacy to follow patient and replace electrolytes per consult.  Larene Beach, PharmD 09/29/2018 8:36 AM

## 2018-09-29 NOTE — Clinical Social Work Note (Signed)
Late entry: [facility] were able to locate patient's daughter: Keynan Heffern and called this CSW late yesterday to inform as such. Mrs. Fearnow then called this CSW and CSW provided the reason why we were looking for family for patient. Patient's daughter was very grateful for this CSW working with the police to find her. She stated she would be okay with making healthcare decisions for her father but that her brother was not going to get involved as they were estranged. CSW provided the number to her for ICU and she contacted patient's nurse and provided information. From documentation it appears patient has now been placed on comfort care. Shela Leff MSW,LCSW [phone]

## 2018-09-29 NOTE — Progress Notes (Signed)
Dr. Jefferson Fuel and Dr. Junious Silk spoke to patient's daughter on the phone and plan is to transition to comfort care for patient. No surgery at this time. Per Dr. Jefferson Fuel, will finish unit of blood already hanging but not give anymore. Notified blood bank of this. Dr. Jefferson Fuel to place orders for comfort care.

## 2018-09-30 LAB — TYPE AND SCREEN
ABO/RH(D): A NEG
Antibody Screen: NEGATIVE
UNIT DIVISION: 0
Unit division: 0
Unit division: 0

## 2018-09-30 LAB — BPAM RBC
Blood Product Expiration Date: 201910252359
Blood Product Expiration Date: 201910292359
Blood Product Expiration Date: 201910312359
ISSUE DATE / TIME: 201910040431
ISSUE DATE / TIME: 201910050912
UNIT TYPE AND RH: 600
Unit Type and Rh: 600
Unit Type and Rh: 600

## 2018-09-30 LAB — PREPARE RBC (CROSSMATCH)

## 2018-09-30 MED ORDER — BELLADONNA ALKALOIDS-OPIUM 16.2-60 MG RE SUPP: 1.0000 | Freq: Three times a day (TID) | RECTAL | 0 refills | Status: AC | PRN

## 2018-09-30 MED ORDER — MORPHINE SULFATE (PF) 2 MG/ML IV SOLN: 2.0000 mg | INTRAVENOUS | 0 refills | Status: AC | PRN

## 2018-09-30 MED ORDER — SODIUM CHLORIDE 0.9 % IV SOLN: 2.0000 mg/h | INTRAVENOUS | Status: AC

## 2018-09-30 MED ORDER — ONDANSETRON HCL 4 MG PO TABS: 4.0000 mg | ORAL_TABLET | Freq: Four times a day (QID) | ORAL | 0 refills | Status: AC | PRN

## 2018-09-30 MED ORDER — ONDANSETRON HCL 4 MG/2ML IJ SOLN: 4.0000 mg | Freq: Four times a day (QID) | INTRAMUSCULAR | 0 refills | Status: AC | PRN

## 2018-09-30 NOTE — Progress Notes (Signed)
Pt ready for discharge to [facility] with report called to [facility] with pt [facility]. Discharge packet sent with EMS including yellow DNR, prescriptions and AVS. Pt was given Morphine 2 mg IV bolus prior to discharge. Released to EMS for transport to [facility]. Dgt has been informed by SW and agreeable with plan.

## 2018-09-30 NOTE — Discharge Summary (Signed)
[facility] NAME: Joseph Trevino    MR#:  [id number]  DATE OF BIRTH:  1956-05-25  DATE OF ADMISSION:  09/24/2018 ADMITTING PHYSICIAN: Epifanio Lesches, MD  DATE OF DISCHARGE:  09/30/18   PRIMARY CARE PHYSICIAN: Patient, [name]    ADMISSION DIAGNOSIS:  Shock ([place]) [R57.9] Bladder outlet obstruction [N32.0] Sepsis, due to unspecified organism [K74.2] Acute metabolic encephalopathy [V95.63]  DISCHARGE DIAGNOSIS:  Principal Problem:   Alcohol abuse Active Problems:   Acute anemia   COPD (chronic obstructive pulmonary disease) (HCC)   Protein-calorie malnutrition, severe   Acute metabolic encephalopathy   Bladder outlet obstruction   Shock ([place])   Palliative care encounter   SECONDARY DIAGNOSIS:   Past Medical History:  Diagnosis Date  . COPD (chronic obstructive pulmonary disease) ([place])   . Tobacco abuse     HOSPITAL COURSE:  Joseph Trevino  is a 62 y.o. male with a known history of COPD brought in by EMS because of altered mental status.  Patient has been staying in [facility] for 3 months.found this morning surrounded by empty bottles of liquor.housecleanig staff smelled  something in room and they caled EMS.  Patient was found to be pulseless initially saw the q. pericardial thump and brought him here.  Patient is alert, awake.  Initially patient was refusing to come so he was made IVC and brought into the hospital.  In the ER found to have s severe acute anemia, acute renal failure, hypothermia, severe lactic acidosis, atrial fibrillation.  Severe hypernatremia of 164.  Patient's core body temperature 94.7 so he has a bear hugger on.  Foley was inserted in the emergency room with immediate return of  2 litre's of urine,, he is having emergency blood transfusion, received 1 unit of blood already getting second unit of blood.  Patient previous labs for 2 years ago with normal kidney function, hemoglobin of 11.7.   Not have any blood work in the last 2 years.     * septic shock --source appears urine   *AcuteSevere anemia-- multifactorial Suspected due to gross hematuria , poor nutrition, anemia of chronic disease, prostate cancer, [place] secondary to chronic alcoholism *Acute gross hematuria appreciate urology input.  Currently no urine output  *Acute renal failure  *Acutesevere hypernatremia due to severe dehydration  *Chronic alcoholism with cirrhosis, thrombocytopenia, electrolyte abnormalities *Acute newly diagnosed atrial fibrillation Unknown duration  *Acute incidentally found sclerotic bony lesions -Suspicious for Prostate cancer with metastatic disease -Oncology input appreciated-PSA greater than 65 suspicious for metastatic prostate cancer  Patient is being followed by palliative care given his very poor prognosis. Given his chronic alcoholism and poor overall health condition not sure if patient has ability to make decision. He is estranged from his family/son and daughter for several years.  -Patient evaluated by Dr. Weber Cooks. He is deemed not competent in making medical decisions  Daughter-healthcare POA made him comfort care on 09/29/2018 patient is started on morphine drip.  Patient is comfortable at this point of time and no family to take care of him Patient needs to be transferred to hospice home when bed is available     Case discussed with Care Management/Social Worker. Management plans discussed with the patient, family and they are in agreement.  DNR with comfort care   DISCHARGE CONDITIONS:   Guarded   CONSULTS OBTAINED:  Treatment Team:  Anthonette Legato, MD Clovis Fredrickson, MD Ardis Hughs, MD Lloyd Huger, MD  Abbie Sons, MD Clapacs, Madie Reno, MD Hollice Espy, MD He, Jun, MD   PROCEDURES   DRUG ALLERGIES:  No Known Allergies  DISCHARGE MEDICATIONS:   Allergies as of 09/30/2018   No Known Allergies      Medication List    STOP taking these medications   albuterol 108 (90 Base) MCG/ACT inhaler Commonly known as:  PROVENTIL HFA;VENTOLIN HFA   aspirin EC 81 MG tablet   famotidine 20 MG tablet Commonly known as:  PEPCID     TAKE these medications   morphine 100 mg in sodium chloride 0.9 % 96 mL Inject 2 mg/hr into the vein continuous.   morphine 2 MG/ML injection Inject 1 mL (2 mg total) into the vein every 2 (two) hours as needed.   ondansetron 4 MG tablet Commonly known as:  ZOFRAN Take 1 tablet (4 mg total) by mouth every 6 (six) hours as needed for nausea.   ondansetron 4 MG/2ML Soln injection Commonly known as:  ZOFRAN Inject 2 mLs (4 mg total) into the vein every 6 (six) hours as needed for nausea.   opium-belladonna 16.2-60 MG suppository Commonly known as:  B&O SUPPRETTES Place 1 suppository rectally every 8 (eight) hours as needed for bladder spasms.        DISCHARGE INSTRUCTIONS:   Transfer pt to hospice home when bed is available  DIET:  Regular diet as tolerated   DISCHARGE CONDITION:  Fair  ACTIVITY:  Bedrest  OXYGEN:  Home Oxygen: Yes.     Oxygen Delivery: as needed  DISCHARGE LOCATION:  Transfer pt to hospice home when bed is available  If you experience worsening of your admission symptoms, develop shortness of breath, life threatening emergency, suicidal or homicidal thoughts you must seek medical attention immediately by calling 911 or calling your MD immediately  if symptoms less severe.  You Must read complete instructions/literature along with all the possible adverse reactions/side effects for all the Medicines you take and that have been prescribed to you. Take any new Medicines after you have completely understood and accpet all the possible adverse reactions/side effects.   Please note  You were cared for by a hospitalist during your hospital stay. If you have any questions about your discharge medications or the care you  received while you were in the hospital after you are discharged, you can call the unit and asked to speak with the hospitalist on call if the hospitalist that took care of you is not available. Once you are discharged, your primary care physician will handle any further medical issues. Please note that NO REFILLS for any discharge medications will be authorized once you are discharged, as it is imperative that you return to your primary care physician (or establish a relationship with a primary care physician if you do not have one) for your aftercare needs so that they can reassess your need for medications and monitor your lab values.     Today  Chief Complaint  Patient presents with  . Code Sepsis   Pt is more awake today  ROS: Unobtainable  VITAL SIGNS:  Blood pressure 91/63, pulse (!) 106, temperature 98.3 F (36.8 C), temperature source Oral, resp. rate 18, height 5\' 7"  (1.702 m), weight 67.6 kg, SpO2 96 %.  I/O:    Intake/Output Summary (Last 24 hours) at 09/30/2018 1115 Last data filed at 09/30/2018 0500 Gross per 24 hour  Intake 208.85 ml  Output 650 ml  Net -441.15 ml    PHYSICAL EXAMINATION:  GENERAL:  62 y.o.-year-old patient lying in the bed with no acute distress.  Cachectic EYES: Pupils equal, round, reactive to light and accommodation. No scleral icterus. Extraocular muscles intact.  HEENT: Head atraumatic, normocephalic. Oropharynx and nasopharynx clear.  NECK:  Supple, no jugular venous distention. No thyroid enlargement, no tenderness.  LUNGS: Normal breath sounds bilaterally, no wheezing, rales,rhonchi or crepitation. No use of accessory muscles of respiration.  CARDIOVASCULAR: S1, S2 normal. No murmurs, rubs, or gallops.  ABDOMEN: Soft, non-tender, non-distended. Bowel sounds present. Marland Kitchen  EXTREMITIES: No pedal edema, cyanosis, or clubbing.  NEUROLOGIC: Patient is more awake and alert. Sensation intact. Gait not checked.  PSYCHIATRIC: The patient is alert and  oriented x1-2 SKIN: No obvious rash, lesion, or ulcer.   DATA REVIEW:   CBC Recent Labs  Lab 09/29/18 0431  WBC 2.8*  HGB 6.7*  HCT 20.1*  PLT 59*    Chemistries  Recent Labs  Lab 09/24/18 1931  09/29/18 0431  NA 161*   < > 143  K 4.0   < > 3.7  CL >130*   < > 117*  CO2 17*   < > 21*  GLUCOSE 179*   < > 112*  BUN 155*   < > 40*  CREATININE 6.66*   < > 1.58*  CALCIUM 8.4*   < > 7.0*  MG  --    < > 1.6*  AST 63*  --   --   ALT 17  --   --   ALKPHOS 155*  --   --   BILITOT 1.8*  --   --    < > = values in this interval not displayed.    Cardiac Enzymes Recent Labs  Lab 09/24/18 1454  TROPONINI 0.05*    Microbiology Results  Results for orders placed or performed during the hospital encounter of 09/24/18  Blood Culture (routine x 2)     Status: None   Collection Time: 09/24/18  2:54 PM  Result Value Ref Range Status   Specimen Description BLOOD RIGHT AC  Final   Special Requests   Final    BOTTLES DRAWN AEROBIC AND ANAEROBIC Blood Culture results may not be optimal due to an excessive volume of blood received in culture bottles   Culture   Final    NO GROWTH 5 DAYS Performed at [facility], [address], [place], [place] [address]    Report Status 09/29/2018 FINAL  Final  Urine culture     Status: None   Collection Time: 09/24/18  2:55 PM  Result Value Ref Range Status   Specimen Description   Final    URINE, RANDOM Performed at [facility], [address], [place], [place] [address]    Special Requests   Final    NONE Performed at [facility], [address] [place], [place] [address]    Culture   Final    NO GROWTH Performed at [facility], [place] [address], [place],  [address]    Report Status 09/26/2018 FINAL  Final  MRSA PCR Screening     Status: None   Collection Time: 09/24/18  8:43 PM  Result Value Ref Range Status   MRSA by PCR NEGATIVE NEGATIVE Final    Comment:        The GeneXpert MRSA  Assay (FDA approved for NASAL specimens only), is one component of a comprehensive MRSA colonization surveillance program. It is not intended to diagnose MRSA infection nor to  guide or monitor treatment for MRSA infections. Performed at [facility], [address] [place], [place] [address]     RADIOLOGY:  Ct Abdomen Pelvis Wo Contrast  Result Date: 09/29/2018 CLINICAL DATA:  Hematuria with continuous bladder irrigation and dysfunction of the patient's Foley catheter. EXAM: CT ABDOMEN AND PELVIS WITHOUT CONTRAST TECHNIQUE: Multidetector CT imaging of the abdomen and pelvis was performed following the standard protocol without IV contrast. COMPARISON:  09/24/2018. FINDINGS: Lower chest: Small left pleural effusion and small to moderate-sized right pleural effusion which appears at least partially loculated. Mild bilateral dependent atelectasis. Hepatobiliary: Mildly lobulated liver contours with a relatively small right lobe and enlarged caudate lobe and lateral segment left lobe. Multiple small gallstones in the gallbladder, measuring up to 3 mm in maximum diameter each. No gallbladder wall thickening or pericholecystic fluid. Pancreas: Unremarkable. No pancreatic ductal dilatation or surrounding inflammatory changes. Spleen: Normal in size without focal abnormality. Adrenals/Urinary Tract: Normal appearing adrenal glands. Interval areas of increased density in the medullary regions of both kidneys. No discrete calculi seen. Moderate dilatation of both renal collecting systems with progression. Stable moderately dilated ureters to the level of the urinary bladder. Contrast is injected into the urinary bladder through the patient's Foley catheter. There is a rounded mass in the bladder with some interspersed air well as some free air in the bladder associated with catheterization. The rounded mass measures 7.4 cm in maximum diameter on image number 69 series 2 and 82 Hounsfield units in  density. There are multiple small areas of similar density in the dependent portion of the bladder. The degree of bladder distention has not changed significantly. Stomach/Bowel: Unremarkable stomach, small bowel and colon. No evidence of appendicitis. Vascular/Lymphatic: The previously demonstrated small infrarenal abdominal aortic aneurysm measuring 3.1 cm in maximum diameter is unchanged. Atheromatous arterial calcifications. No enlarged lymph nodes. Reproductive: Prostate is unremarkable. Other: Small to moderate amount of free peritoneal fluid, increased. Interval diffuse subcutaneous edema. Musculoskeletal: Stable previously demonstrated patchy sclerosis throughout the bony skeleton. IMPRESSION: 1. Large, rounded clot and smaller clots in the urinary bladder. 2. Stable bladder distention. 3. Moderate bilateral hydronephrosis and hydroureter with mild progression. 4. Interval small left pleural effusion and small to moderate-sized right pleural effusion. The pleural effusion on the right appears at least partially loculated. 5. Mild bilateral lower lobe atelectasis. 6. Stable changes suggesting cirrhosis of the liver. 7. Interval diffuse anasarca. 8. Stable diffuse patchy bone sclerosis compatible with metastatic prostate cancer. 9. Cholelithiasis. 10. Stable 3.1 cm infrarenal abdominal aortic aneurysm. Recommend followup by ultrasound in 3 years. This recommendation follows ACR consensus guidelines: White Paper of the ACR Incidental Findings Committee II on Vascular Findings. Joellyn Rued Radiol 2013; [phone] Electronically Signed   By: Claudie Revering M.D.   On: 09/29/2018 08:34    EKG:   Orders placed or performed during the hospital encounter of 09/24/18  . ED EKG 12-Lead  . ED EKG 12-Lead  . EKG 12-Lead  . EKG 12-Lead      Management plans discussed with the patient, daughter Larene Beach and she is  in agreement.  CODE STATUS:     Code Status Orders  (From admission, onward)         Start      Ordered   09/29/18 1107  Do not attempt resuscitation (DNR)  Continuous    Question Answer Comment  In the event of cardiac or respiratory ARREST Do not call a "code blue"   In the event of  cardiac or respiratory ARREST Do not perform Intubation, CPR, defibrillation or ACLS   In the event of cardiac or respiratory ARREST Use medication by any route, position, wound care, and other measures to relive pain and suffering. May use oxygen, suction and manual treatment of airway obstruction as needed for comfort.      09/29/18 1107        Code Status History    Date Active Date Inactive Code Status Order ID Comments User Context   09/24/2018 1730 09/29/2018 1107 Full Code [id number]  Epifanio Lesches, MD ED      TOTAL TIME TAKING CARE OF THIS PATIENT: 41 minutes.   Note: This dictation was prepared with Dragon dictation along with smaller phrase technology. Any transcriptional errors that result from this process are unintentional.   @M[facility] @  on 09/30/2018 at 11:15 AM  Between 7am to 6pm - Pager - [phone]  After 6pm go to [email or web address] - password [email or web address] [facility]  Office  [phone]  CC: Primary care physician; Patient, [name]

## 2018-09-30 NOTE — Progress Notes (Signed)
[facility]  ROUNDING NOTE   Subjective:  Patient resting comfortably in bed. Currently on morphine drip at 1 mg/h.  Objective:  Vital signs in last 24 hours:  Temp:  [98.2 F (36.8 C)-98.3 F (36.8 C)] 98.3 F (36.8 C) (10/06 0509) Pulse Rate:  [106-113] 106 (10/06 0509) Resp:  [16-18] 18 (10/06 0509) BP: (91-107)/(63-77) 91/63 (10/06 0509) SpO2:  [96 %-99 %] 96 % (10/06 0509)  Weight change:  Filed Weights   09/27/18 0453 09/28/18 0438 09/29/18 0438  Weight: 65.5 kg 65.5 kg 67.6 kg    Intake/Output: I/O last 3 completed shifts: In: 26459.7 [I.V.:2029.7; Blood:380; Other:24000; IV Piggyback:50] Out: 35500 [ZOXWR:[id number]]   Intake/Output this shift:  No intake/output data recorded.  Physical Exam: General: Critically ill appearing  Head: Normocephalic, atraumatic. moist oral mucosal membranes  Eyes: Anicteric  Neck: Supple, trachea midline  Lungs:  Clear to auscultation, normal effort  Heart: S1S2 no rubs  Abdomen:  Soft, lower abdominal distension noted  Extremities: no peripheral edema.  Neurologic: Lethargic but arousable  Skin: B/L UE ecchymoses       Basic Metabolic Panel: Recent Labs  Lab 09/25/18 2036  09/26/18 0814  09/26/18 1809 09/27/18 0447 09/27/18 1004 09/27/18 1415 09/27/18 1757 09/28/18 0424 09/29/18 0431  NA 151*   < > 152*   < > 148* 146* 147* 145 144 143 143  K 3.4*  --  3.0*  --  3.6 3.4*  --   --   --  3.4* 3.7  CL 125*  --  125*  --   --  120*  --   --   --  117* 117*  CO2 20*  --  20*  --   --  20*  --   --   --  21* 21*  GLUCOSE 167*  --  144*  --   --  136*  --   --   --  113* 112*  BUN 114*  --  100*  --   --  79*  --   --   --  56* 40*  CREATININE 3.89*  --  3.72*  --   --  3.02*  --   --   --  2.12* 1.58*  CALCIUM 7.5*  --  7.6*  --   --  7.2*  --   --   --  6.8* 7.0*  MG  --   --  1.8  --   --  1.8  --   --   --   --  1.6*  PHOS  --   --  3.6  --   --  3.5  --   --   --   --   --    < > = values in this  interval not displayed.    Liver Function Tests: Recent Labs  Lab 09/24/18 1454 09/24/18 1931  AST 48* 63*  ALT 11 17  ALKPHOS 179* 155*  BILITOT 1.9* 1.8*  PROT 6.7 6.4*  ALBUMIN 2.5* 2.4*   No results for input(s): LIPASE, AMYLASE in the last 168 hours. Recent Labs  Lab 09/24/18 1931  AMMONIA 42*    CBC: Recent Labs  Lab 09/24/18 1931  09/26/18 0814 09/26/18 1809 09/27/18 0447 09/27/18 1004 09/27/18 1415 09/28/18 0020 09/28/18 0954 09/29/18 0431  WBC 5.6   < > 7.5 5.0 4.6 5.0  --  3.2* 4.1 2.8*  NEUTROABS 4.5  --  5.7 3.8  --  3.9  --   --   --  2.1  HGB 7.1*   < > 6.7* 7.6* 6.3* 7.5*  --  6.4* 7.6* 6.7*  HCT 21.7*   < > 19.2* 22.0* 17.8* 21.1*  --  17.6* 21.5* 20.1*  MCV 83.5   < > 82.9 81.8 79.9* 82.5  --  81.5 83.4 85.0  PLT 38*   < > 68* 37* 57* 54* 80* 94* 78* 59*   < > = values in this interval not displayed.    Cardiac Enzymes: Recent Labs  Lab 09/24/18 1454 09/25/18 0442  CKTOTAL 73 43*  TROPONINI 0.05*  --     BNP: Invalid input(s): POCBNP  CBG: Recent Labs  Lab 09/28/18 1140 09/28/18 1549 09/28/18 1953 09/28/18 2244 09/29/18 0849  GLUCAP 103* 109* 102* 97 80    Microbiology: Results for orders placed or performed during the hospital encounter of 09/24/18  Blood Culture (routine x 2)     Status: None   Collection Time: 09/24/18  2:54 PM  Result Value Ref Range Status   Specimen Description BLOOD RIGHT AC  Final   Special Requests   Final    BOTTLES DRAWN AEROBIC AND ANAEROBIC Blood Culture results may not be optimal due to an excessive volume of blood received in culture bottles   Culture   Final    NO GROWTH 5 DAYS Performed at [facility], [address] [place], [place] [address]    Report Status 09/29/2018 FINAL  Final  Urine culture     Status: None   Collection Time: 09/24/18  2:55 PM  Result Value Ref Range Status   Specimen Description   Final    URINE, RANDOM Performed at [facility], [address], [place], [place] [address]    Special Requests   Final    NONE Performed at [facility], [address], [place], [place] [address]    Culture   Final    NO GROWTH Performed at [facility], [place] [address], [place], [place] [address]    Report Status 09/26/2018 FINAL  Final  MRSA PCR Screening     Status: None   Collection Time: 09/24/18  8:43 PM  Result Value Ref Range Status   MRSA by PCR NEGATIVE NEGATIVE Final    Comment:        The GeneXpert MRSA Assay (FDA approved for NASAL specimens only), is one component of a comprehensive MRSA colonization surveillance program. It is not intended to diagnose MRSA infection nor to guide or monitor treatment for MRSA infections. Performed at [facility], [address] [place], [place] [address]     Coagulation Studies: Recent Labs    09/28/18 0424  LABPROT 15.4*  INR 1.23    Urinalysis: No results for input(s): COLORURINE, LABSPEC, PHURINE, GLUCOSEU, HGBUR, BILIRUBINUR, KETONESUR, PROTEINUR, UROBILINOGEN, NITRITE, LEUKOCYTESUR in the last 72 hours.  Invalid input(s): APPERANCEUR    Imaging: Ct Abdomen Pelvis Wo Contrast  Result Date: 09/29/2018 CLINICAL DATA:  Hematuria with continuous bladder irrigation and dysfunction of the patient's Foley catheter. EXAM: CT ABDOMEN AND PELVIS WITHOUT CONTRAST TECHNIQUE: Multidetector CT imaging of the abdomen and pelvis was performed following the standard protocol without IV contrast. COMPARISON:  09/24/2018. FINDINGS: Lower chest: Small left pleural effusion and small to moderate-sized right pleural effusion which appears at least partially loculated. Mild bilateral dependent atelectasis. Hepatobiliary: Mildly lobulated liver contours with a relatively small right lobe and enlarged caudate lobe and lateral segment left lobe. Multiple small gallstones in the gallbladder,  measuring up to 3 mm in maximum diameter each. No gallbladder wall thickening  or pericholecystic fluid. Pancreas: Unremarkable. No pancreatic ductal dilatation or surrounding inflammatory changes. Spleen: Normal in size without focal abnormality. Adrenals/Urinary Tract: Normal appearing adrenal glands. Interval areas of increased density in the medullary regions of both kidneys. No discrete calculi seen. Moderate dilatation of both renal collecting systems with progression. Stable moderately dilated ureters to the level of the urinary bladder. Contrast is injected into the urinary bladder through the patient's Foley catheter. There is a rounded mass in the bladder with some interspersed air well as some free air in the bladder associated with catheterization. The rounded mass measures 7.4 cm in maximum diameter on image number 69 series 2 and 82 Hounsfield units in density. There are multiple small areas of similar density in the dependent portion of the bladder. The degree of bladder distention has not changed significantly. Stomach/Bowel: Unremarkable stomach, small bowel and colon. No evidence of appendicitis. Vascular/Lymphatic: The previously demonstrated small infrarenal abdominal aortic aneurysm measuring 3.1 cm in maximum diameter is unchanged. Atheromatous arterial calcifications. No enlarged lymph nodes. Reproductive: Prostate is unremarkable. Other: Small to moderate amount of free peritoneal fluid, increased. Interval diffuse subcutaneous edema. Musculoskeletal: Stable previously demonstrated patchy sclerosis throughout the bony skeleton. IMPRESSION: 1. Large, rounded clot and smaller clots in the urinary bladder. 2. Stable bladder distention. 3. Moderate bilateral hydronephrosis and hydroureter with mild progression. 4. Interval small left pleural effusion and small to moderate-sized right pleural effusion. The pleural effusion on the right appears at least partially loculated. 5. Mild bilateral lower lobe atelectasis. 6. Stable changes suggesting cirrhosis of the liver. 7.  Interval diffuse anasarca. 8. Stable diffuse patchy bone sclerosis compatible with metastatic prostate cancer. 9. Cholelithiasis. 10. Stable 3.1 cm infrarenal abdominal aortic aneurysm. Recommend followup by ultrasound in 3 years. This recommendation follows ACR consensus guidelines: White Paper of the ACR Incidental Findings Committee II on Vascular Findings. Joellyn Rued Radiol 2013; [phone] Electronically Signed   By: Claudie Revering M.D.   On: 09/29/2018 08:34     Medications:   . morphine 1 mg/hr (09/29/18 1700)   . sodium chloride flush  10-40 mL Intracatheter Q12H   morphine injection, ondansetron **OR** ondansetron (ZOFRAN) IV, opium-belladonna, sodium chloride flush  Assessment/ Plan:  62 y.o. male with a PMHx of COPD, tobacco abuse, who was admitted to [facility] on 09/24/2018 for evaluation of altered mental status.  It appears that the patient has been residing at a local hotel for approximately 3 months and was abusing ETOH.  Found to have severe metabolic deranagements at admission.   1.  Acute renal failure.  2.  Hypernatremia. 3.  Urinary retention/bilateral hydronephrosis.  4.  Severe anemia.   5.  Metabolic/lactic acidosis.   Plan: Patient has been transitioned to comfort care.  Over the course of the hospitalization the patient's acute renal failure did improve significantly.  His hypernatremia also resolved.  However he is now on comfort care.  He is maintained on a morphine drip at 1 mg/h.  He is awaiting placement at the hospice home.  Patient was resting comfortably today.   LOS: 6 Lashaundra Lehrmann 10/6/201912:02 PM

## 2018-09-30 NOTE — Clinical Social Work Note (Signed)
The patient will discharge today to the [facility]. The CSW has faxed the discharge summary and will deliver the discharge packet once the Admissions RN confirms transport time. The patient will need his IVs and foley to remain. CSW will then sign off. Please consult should additional needs arise.  Santiago Bumpers, MSW, Latanya Presser [phone]

## 2018-09-30 NOTE — Discharge Instructions (Signed)
Transfer pt to hospice home when bed is available

## 2018-09-30 NOTE — Clinical Social Work Note (Signed)
CSW contacted the admissions RN for the weekend at [facility]. Debbie reported that no beds are available currently; although, one may open today depending on imminent passing of a resident. The CSW has sent the hospice referral and will update as more information becomes available.  Santiago Bumpers, MSW, Latanya Presser [phone]

## 2018-09-30 NOTE — Progress Notes (Signed)
Pt resting quietly with eyes closed. Offered po's took sip of water with slow swallow/oral care peformed.  Morphine drip continued. Pt denies pain. No urinary output with 3way foley in place with MD and family aware. Comfort measures continued.

## 2018-09-30 NOTE — Progress Notes (Signed)
[facility] NAME: Joseph Trevino    MR#:  [id number]  DATE OF BIRTH:  03-26-1956  SUBJECTIVE:   Patient appears intermittently confused. He has no insight into his medical problem. Daughter made him comfort care 10/5 REVIEW OF SYSTEMS:   Review of Systems  Unable to perform ROS: Medical condition   Tolerating Diet:yes Tolerating PT: pending  DRUG ALLERGIES:  No Known Allergies  VITALS:  Blood pressure 91/63, pulse (!) 106, temperature 98.3 F (36.8 C), temperature source Oral, resp. rate 18, height 5\' 7"  (1.702 m), weight 67.6 kg, SpO2 96 %.  PHYSICAL EXAMINATION:   Physical Exam  GENERAL:  62 y.o.-year-old patient lying in the bed with no acute distress. Malnourished EYES: Pupils equal, round, reactive to light and accommodation. No scleral icterus. Extraocular muscles intact.  HEENT: Head atraumatic, normocephalic. Oropharynx and nasopharynx clear.  NECK:  Supple, no jugular venous distention. No thyroid enlargement, no tenderness.  LUNGS: Normal breath sounds bilaterally, no wheezing, rales, rhonchi. No use of accessory muscles of respiration.  CARDIOVASCULAR: S1, S2 normal. No murmurs, rubs, or gallops.  ABDOMEN: Soft, nontender, nondistended. Bowel sounds present. No organomegaly or mass. Foley catheter with red tinged urine EXTREMITIES: No cyanosis, clubbing or edema b/l.    NEUROLOGIC: Awake and somewhat alert. No focal Motor or sensory deficits b/l.   PSYCHIATRIC:  patient is awake. SKIN: No obvious rash, lesion, or ulcer. Bruises of several staging both upper extremities  LABORATORY PANEL:  CBC Recent Labs  Lab 09/29/18 0431  WBC 2.8*  HGB 6.7*  HCT 20.1*  PLT 59*    Chemistries  Recent Labs  Lab 09/24/18 1931  09/29/18 0431  NA 161*   < > 143  K 4.0   < > 3.7  CL >130*   < > 117*  CO2 17*   < > 21*  GLUCOSE 179*   < > 112*  BUN 155*   < > 40*  CREATININE 6.66*   < > 1.58*  CALCIUM 8.4*    < > 7.0*  MG  --    < > 1.6*  AST 63*  --   --   ALT 17  --   --   ALKPHOS 155*  --   --   BILITOT 1.8*  --   --    < > = values in this interval not displayed.   Cardiac Enzymes Recent Labs  Lab 09/24/18 1454  TROPONINI 0.05*   RADIOLOGY:  Ct Abdomen Pelvis Wo Contrast  Result Date: 09/29/2018 CLINICAL DATA:  Hematuria with continuous bladder irrigation and dysfunction of the patient's Foley catheter. EXAM: CT ABDOMEN AND PELVIS WITHOUT CONTRAST TECHNIQUE: Multidetector CT imaging of the abdomen and pelvis was performed following the standard protocol without IV contrast. COMPARISON:  09/24/2018. FINDINGS: Lower chest: Small left pleural effusion and small to moderate-sized right pleural effusion which appears at least partially loculated. Mild bilateral dependent atelectasis. Hepatobiliary: Mildly lobulated liver contours with a relatively small right lobe and enlarged caudate lobe and lateral segment left lobe. Multiple small gallstones in the gallbladder, measuring up to 3 mm in maximum diameter each. No gallbladder wall thickening or pericholecystic fluid. Pancreas: Unremarkable. No pancreatic ductal dilatation or surrounding inflammatory changes. Spleen: Normal in size without focal abnormality. Adrenals/Urinary Tract: Normal appearing adrenal glands. Interval areas of increased density in the medullary regions of both kidneys. No discrete calculi seen. Moderate dilatation of both renal collecting systems with progression. Stable moderately  dilated ureters to the level of the urinary bladder. Contrast is injected into the urinary bladder through the patient's Foley catheter. There is a rounded mass in the bladder with some interspersed air well as some free air in the bladder associated with catheterization. The rounded mass measures 7.4 cm in maximum diameter on image number 69 series 2 and 82 Hounsfield units in density. There are multiple small areas of similar density in the dependent  portion of the bladder. The degree of bladder distention has not changed significantly. Stomach/Bowel: Unremarkable stomach, small bowel and colon. No evidence of appendicitis. Vascular/Lymphatic: The previously demonstrated small infrarenal abdominal aortic aneurysm measuring 3.1 cm in maximum diameter is unchanged. Atheromatous arterial calcifications. No enlarged lymph nodes. Reproductive: Prostate is unremarkable. Other: Small to moderate amount of free peritoneal fluid, increased. Interval diffuse subcutaneous edema. Musculoskeletal: Stable previously demonstrated patchy sclerosis throughout the bony skeleton. IMPRESSION: 1. Large, rounded clot and smaller clots in the urinary bladder. 2. Stable bladder distention. 3. Moderate bilateral hydronephrosis and hydroureter with mild progression. 4. Interval small left pleural effusion and small to moderate-sized right pleural effusion. The pleural effusion on the right appears at least partially loculated. 5. Mild bilateral lower lobe atelectasis. 6. Stable changes suggesting cirrhosis of the liver. 7. Interval diffuse anasarca. 8. Stable diffuse patchy bone sclerosis compatible with metastatic prostate cancer. 9. Cholelithiasis. 10. Stable 3.1 cm infrarenal abdominal aortic aneurysm. Recommend followup by ultrasound in 3 years. This recommendation follows ACR consensus guidelines: White Paper of the ACR Incidental Findings Committee II on Vascular Findings. Joellyn Rued Radiol 2013; [phone] Electronically Signed   By: Claudie Revering M.D.   On: 09/29/2018 08:34   ASSESSMENT AND PLAN:  62 year old admitted with severe anemia acute renal failure hypothermia  * septic shock --source appears urine   *Acute Severe anemia -- multifactorial Suspected due to gross hematuria , poor nutrition, anemia of chronic disease, prostate cancer, [place] secondary to chronic alcoholism *Acute gross hematuria appreciate urology input.  Currently no urine output  *Acute  renal failure  *Acute severe hypernatremia due to severe dehydration  *Chronic alcoholism with cirrhosis, thrombocytopenia, electrolyte abnormalities *Acute newly diagnosed atrial fibrillation Unknown duration  *Acute incidentally found sclerotic bony lesions -Suspicious for Prostate cancer with metastatic disease -Oncology input appreciated-PSA greater than 65 suspicious for metastatic prostate cancer  Patient is being followed by palliative care given his very poor prognosis. Given his chronic alcoholism and poor overall health condition not sure if patient has ability to make decision. He is estranged from his family/son and daughter for several years.  -Patient evaluated by Dr. Weber Cooks. He is deemed not competent in making medical decisions  Daughter-healthcare POA made him comfort care on 09/29/2018 patient is started on morphine drip.  Patient is comfortable at this point of time and no family to take care of him Patient needs to be transferred to hospice home when bed is available we will follow with social worker  Long-term prognosis is poor-palliative care consultation recommended  Case discussed with Care Management/Social Worker. Management plans discussed with the patient, family and they are in agreement.  CODE STATUS: full  DVT Prophylaxis: scd  TOTAL TIME TAKING CARE OF THIS PATIENT: *28* minutes.  >50% time spent on counselling and coordination of care  POSSIBLE D/C IN few DAYS, DEPENDING ON CLINICAL CONDITION.  Note: This dictation was prepared with Dragon dictation along with smaller phrase technology. Any transcriptional errors that result from this process are unintentional.  Nicholes Mango M.D on 09/30/2018  at 8:17 AM  Between 7am to 6pm - Pager - [phone]  After 6pm go to [email or web address] - password [email or web address]  [facility]  Office  [phone]  CC: Primary care physician; Patient, No Pcp PerPatient ID: Joseph Trevino, male   DOB:  [birth year], 62 y.o.   MRN: [id number]

## 2018-09-30 NOTE — Progress Notes (Signed)
Dgt in to see pt and spoke with Dr. Margaretmary Eddy. No changes in POC at this time.

## 2018-10-26 DEATH — deceased

## 2019-11-05 IMAGING — CT CT HEAD W/O CM
4 of 7 series · 16 of 47 positions shown, 18 images · non-contrast
Comparison: None.

CLINICAL DATA: Altered mental status

EXAM:
CT HEAD WITHOUT CONTRAST
TECHNIQUE: Contiguous axial images were obtained from the base of the skull
through the vertex without intravenous contrast.

[Series 2: head wo · axial · 0.47mm/px · z∈[-157,-52]mm · 5 of 33 slices shown, 7 images (1 of 2)]
[im 6/33  brain]
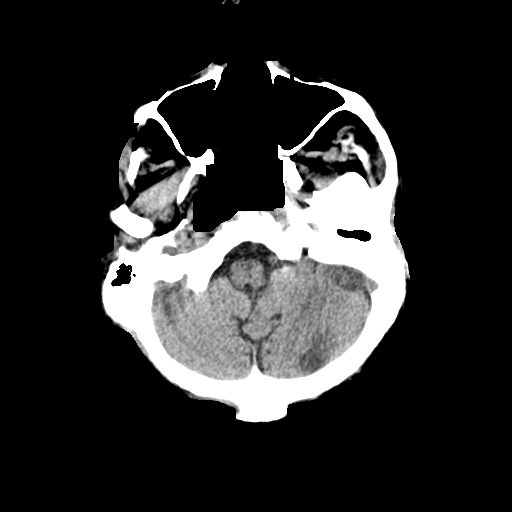
[im 6/33  bone]
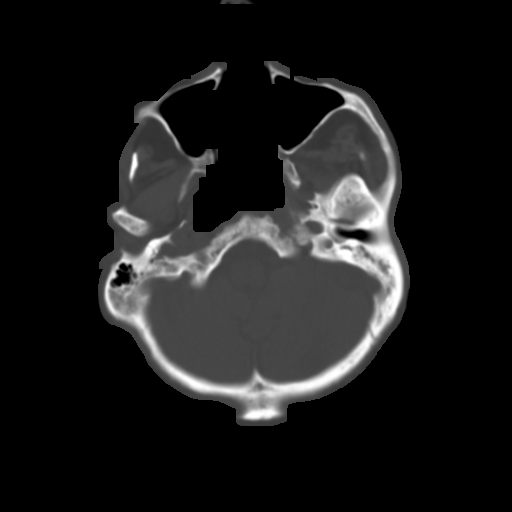
[im 11/33  brain]
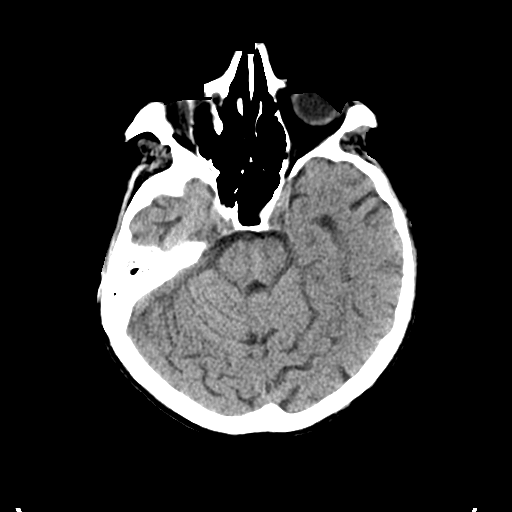
[im 17/33  brain]
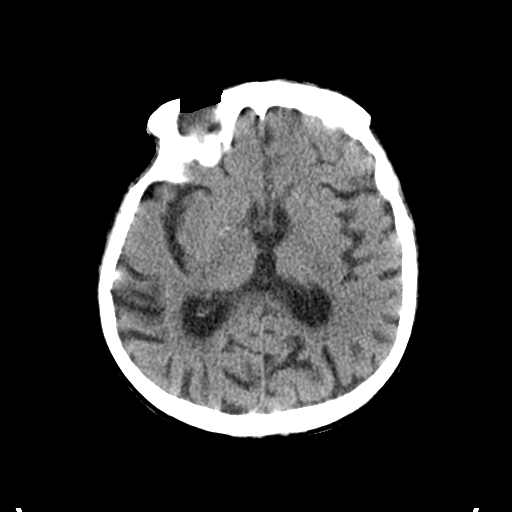
[im 22/33  brain]
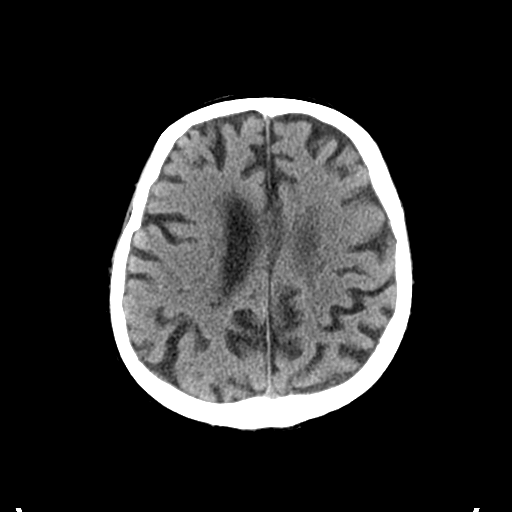
[im 27/33  brain]
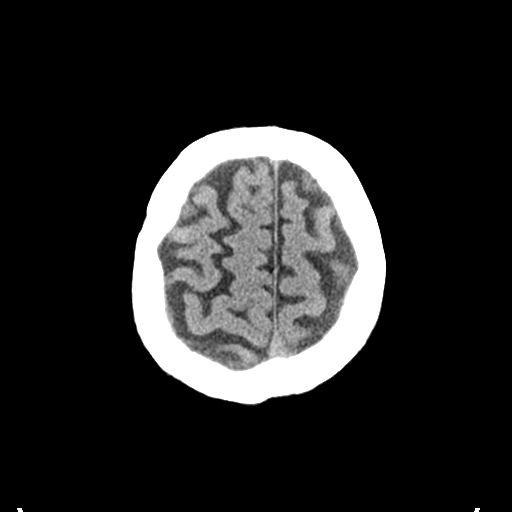
[im 27/33  bone]
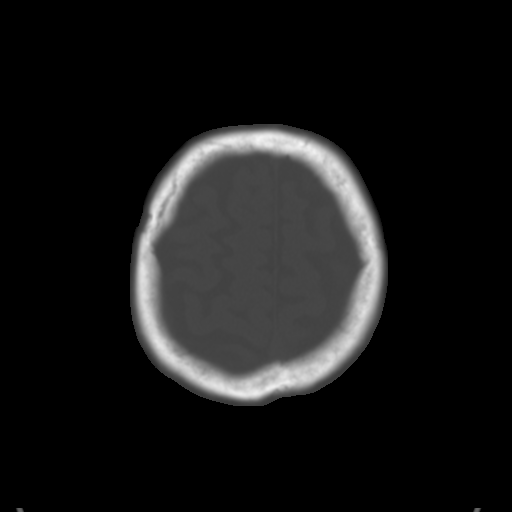

[Series 4: head wo · axial · 0.36mm/px · z∈[-97,-14]mm · 5 of 32 slices shown (2 of 2)]
[im 6/32  brain]
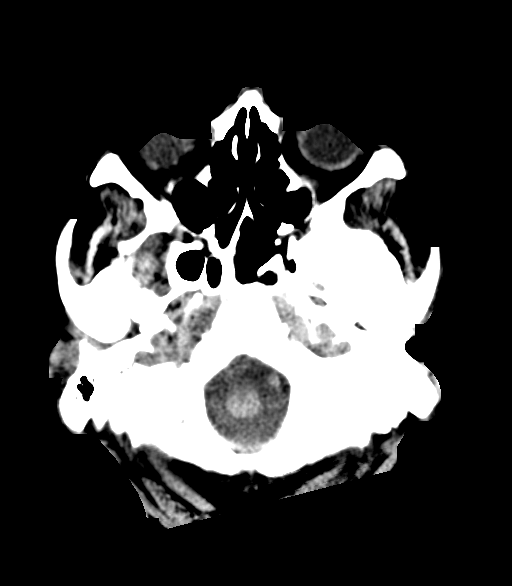
[im 11/32  brain]
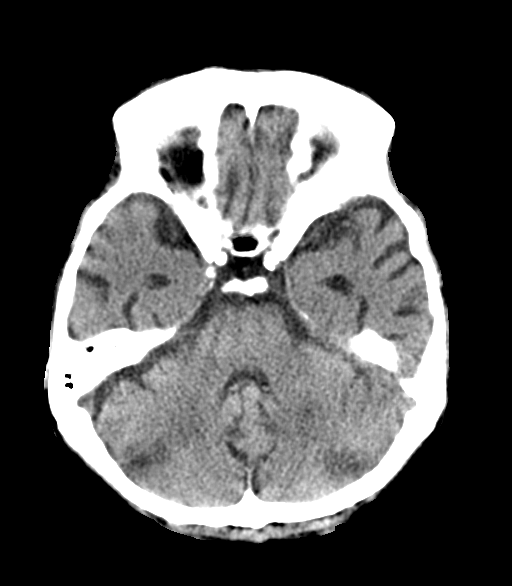
[im 16/32  brain]
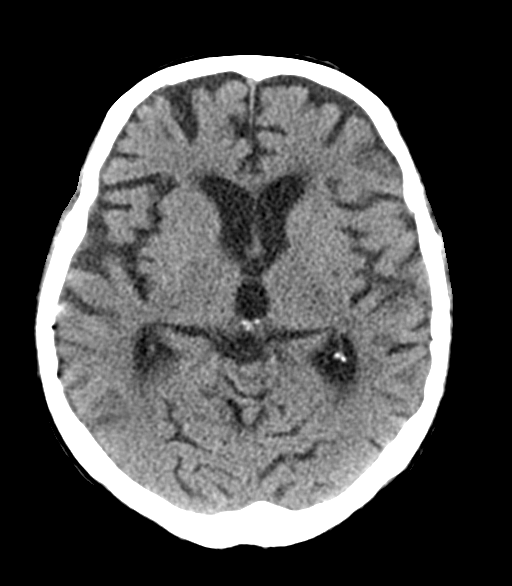
[im 21/32  brain]
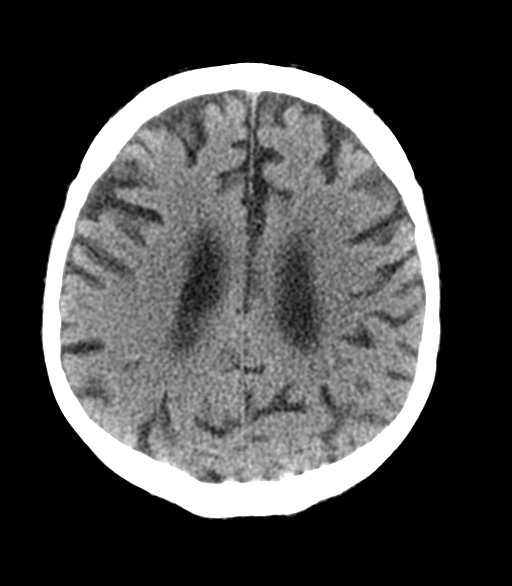
[im 26/32  brain]
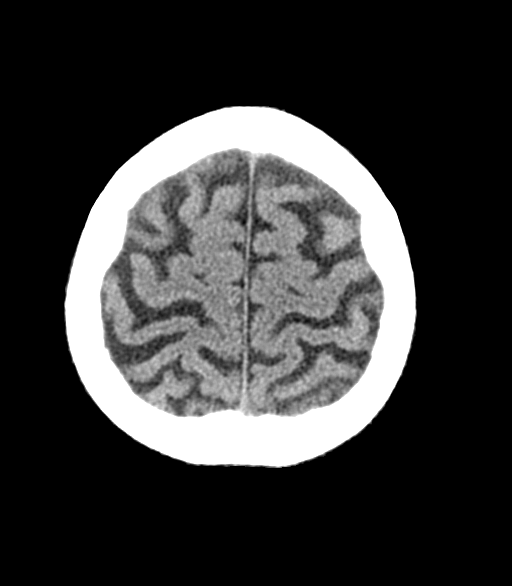

[Series 8: coronal soft tissue · coronal · 0.32mm/px · 3 of 62 slices shown]
[im 23/62  brain]
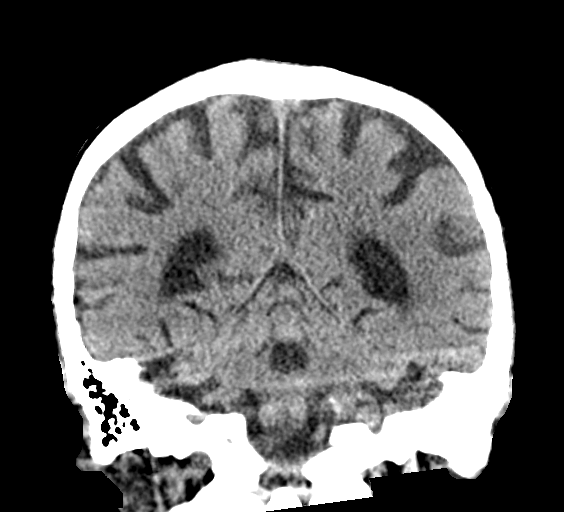
[im 28/62  brain]
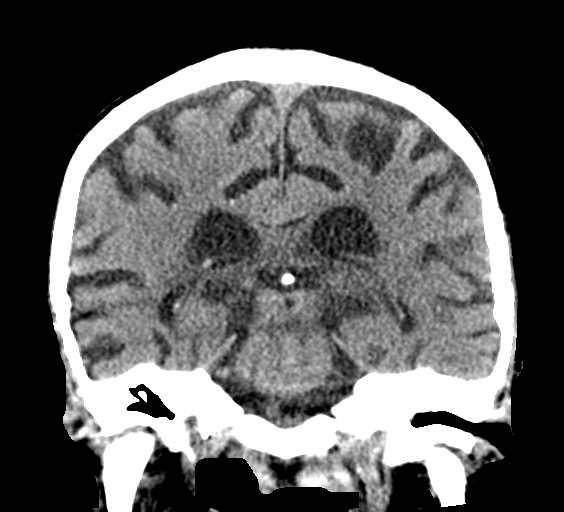
[im 34/62  brain]
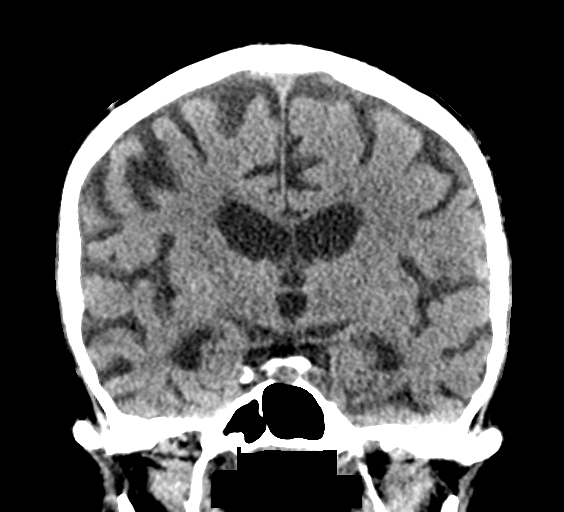

[Series 9: sagittal soft tissue · sagittal · 0.34mm/px · 3 of 50 slices shown]
[im 17/50  brain]
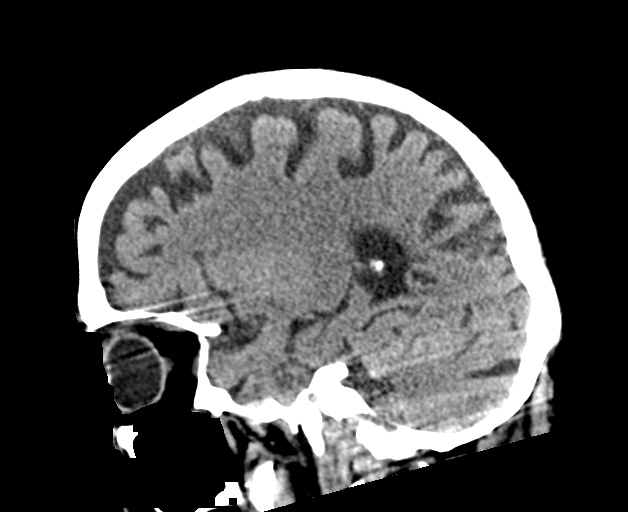
[im 25/50  brain]
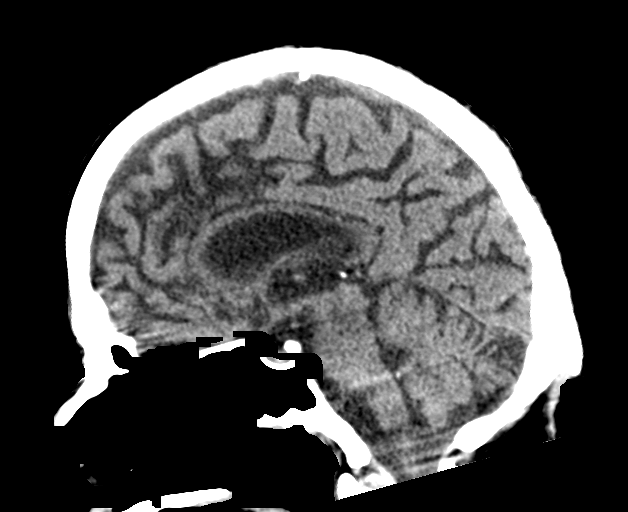
[im 33/50  brain]
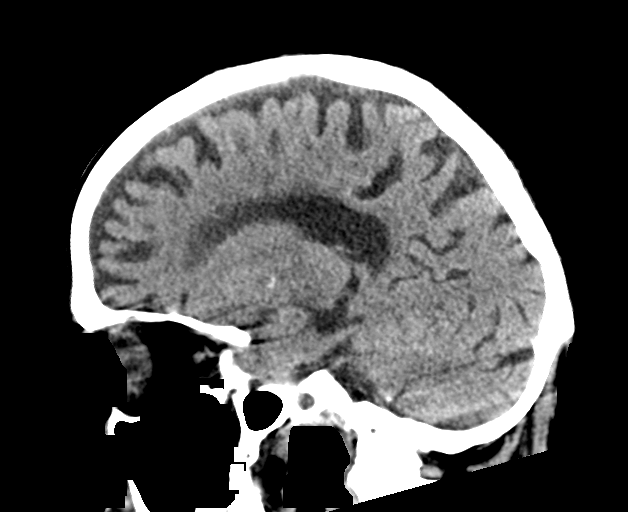

[16 of 47 positions shown; findings below may reference images not displayed]

FINDINGS: Brain: Diffusely enlarged ventricles and subarachnoid spaces. Patchy
white matter low density in both cerebral hemispheres. No
intracranial hemorrhage, mass lesion or CT evidence of acute
infarction.

Vascular: No hyperdense vessel or unexpected calcification.

Skull: Normal. Negative for fracture or focal lesion.

Sinuses/Orbits: Status post bilateral cataract extraction.
Unremarkable paranasal sinuses.

Other: None.
IMPRESSION: 1. No acute abnormality.
2. Moderate diffuse cerebral and cerebellar atrophy and mild chronic
small vessel white matter ischemic changes in both cerebral
hemispheres..

## 2019-11-05 IMAGING — DX DG CHEST 1V PORT
2 series · 2 of 2 positions shown · non-contrast
Comparison: Chest radiographs 04/20/2016 and earlier.

CLINICAL DATA: 62-year-old male with sepsis.

EXAM:
PORTABLE CHEST 1 VIEW

[chest ap (1 of 2)]
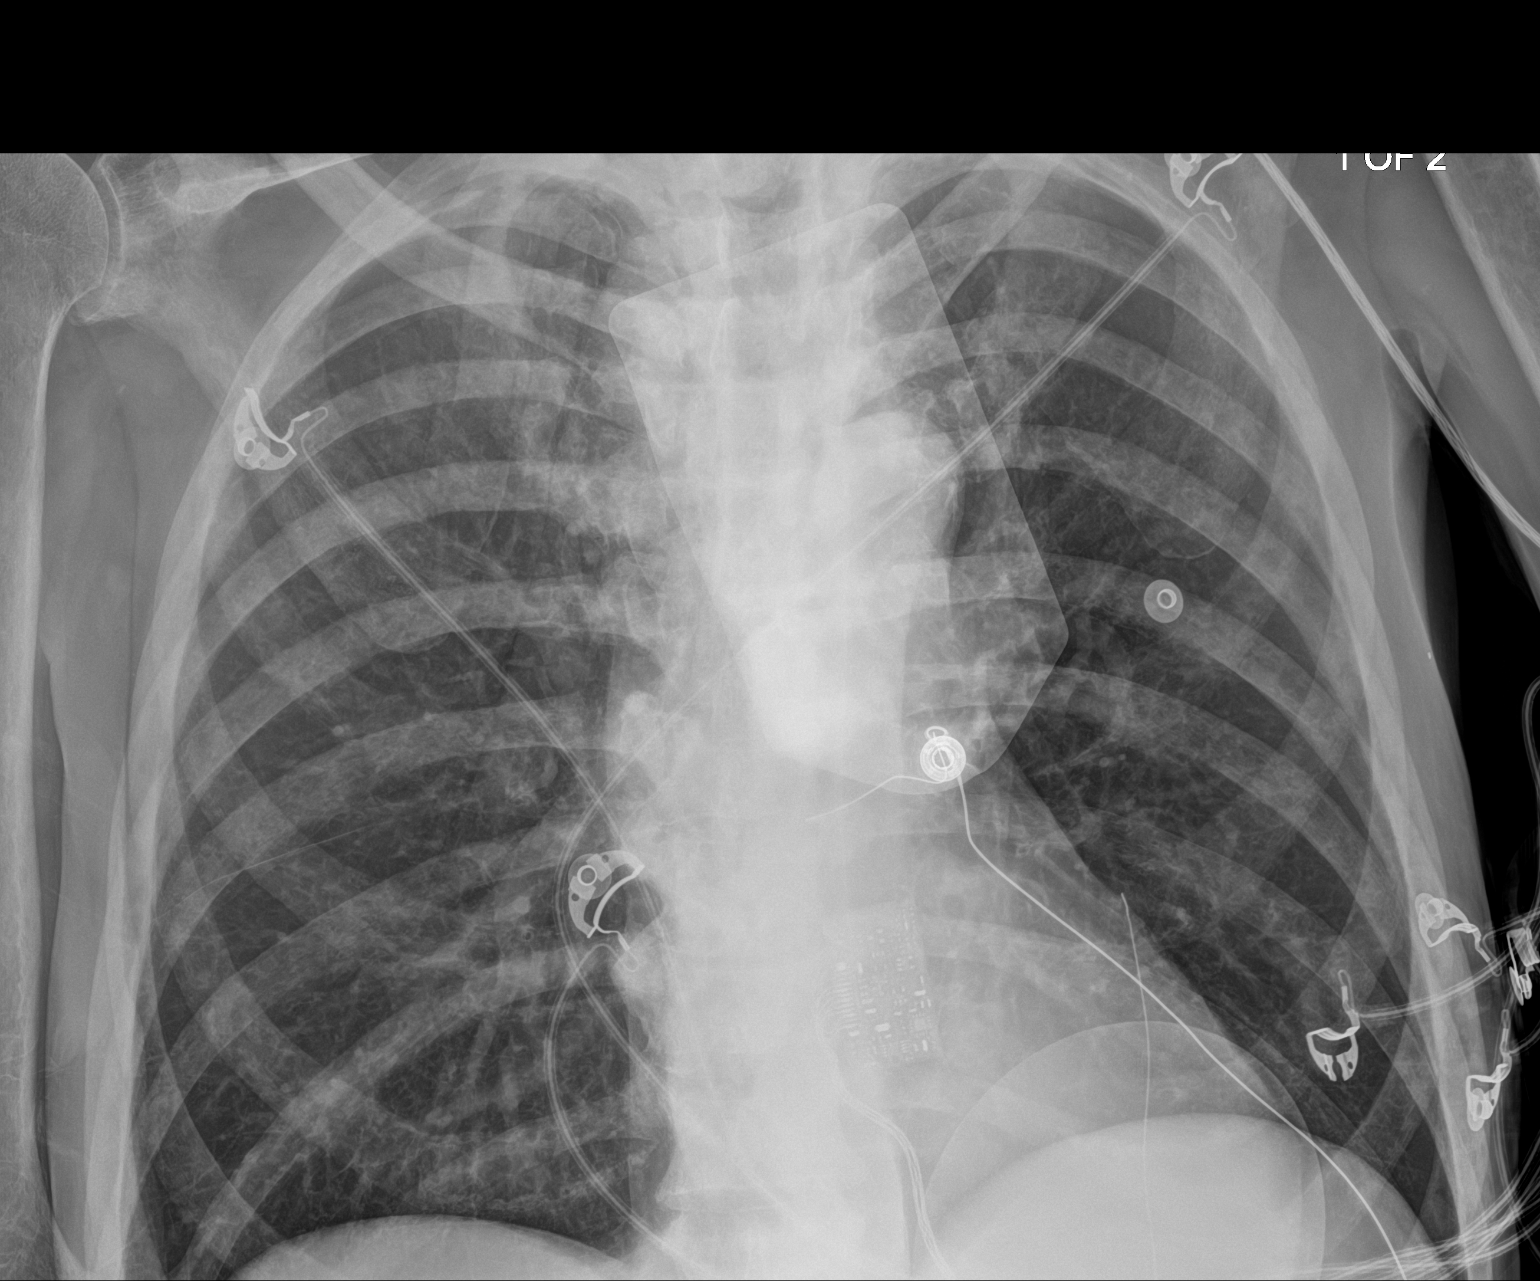

[chest ap (2 of 2)]
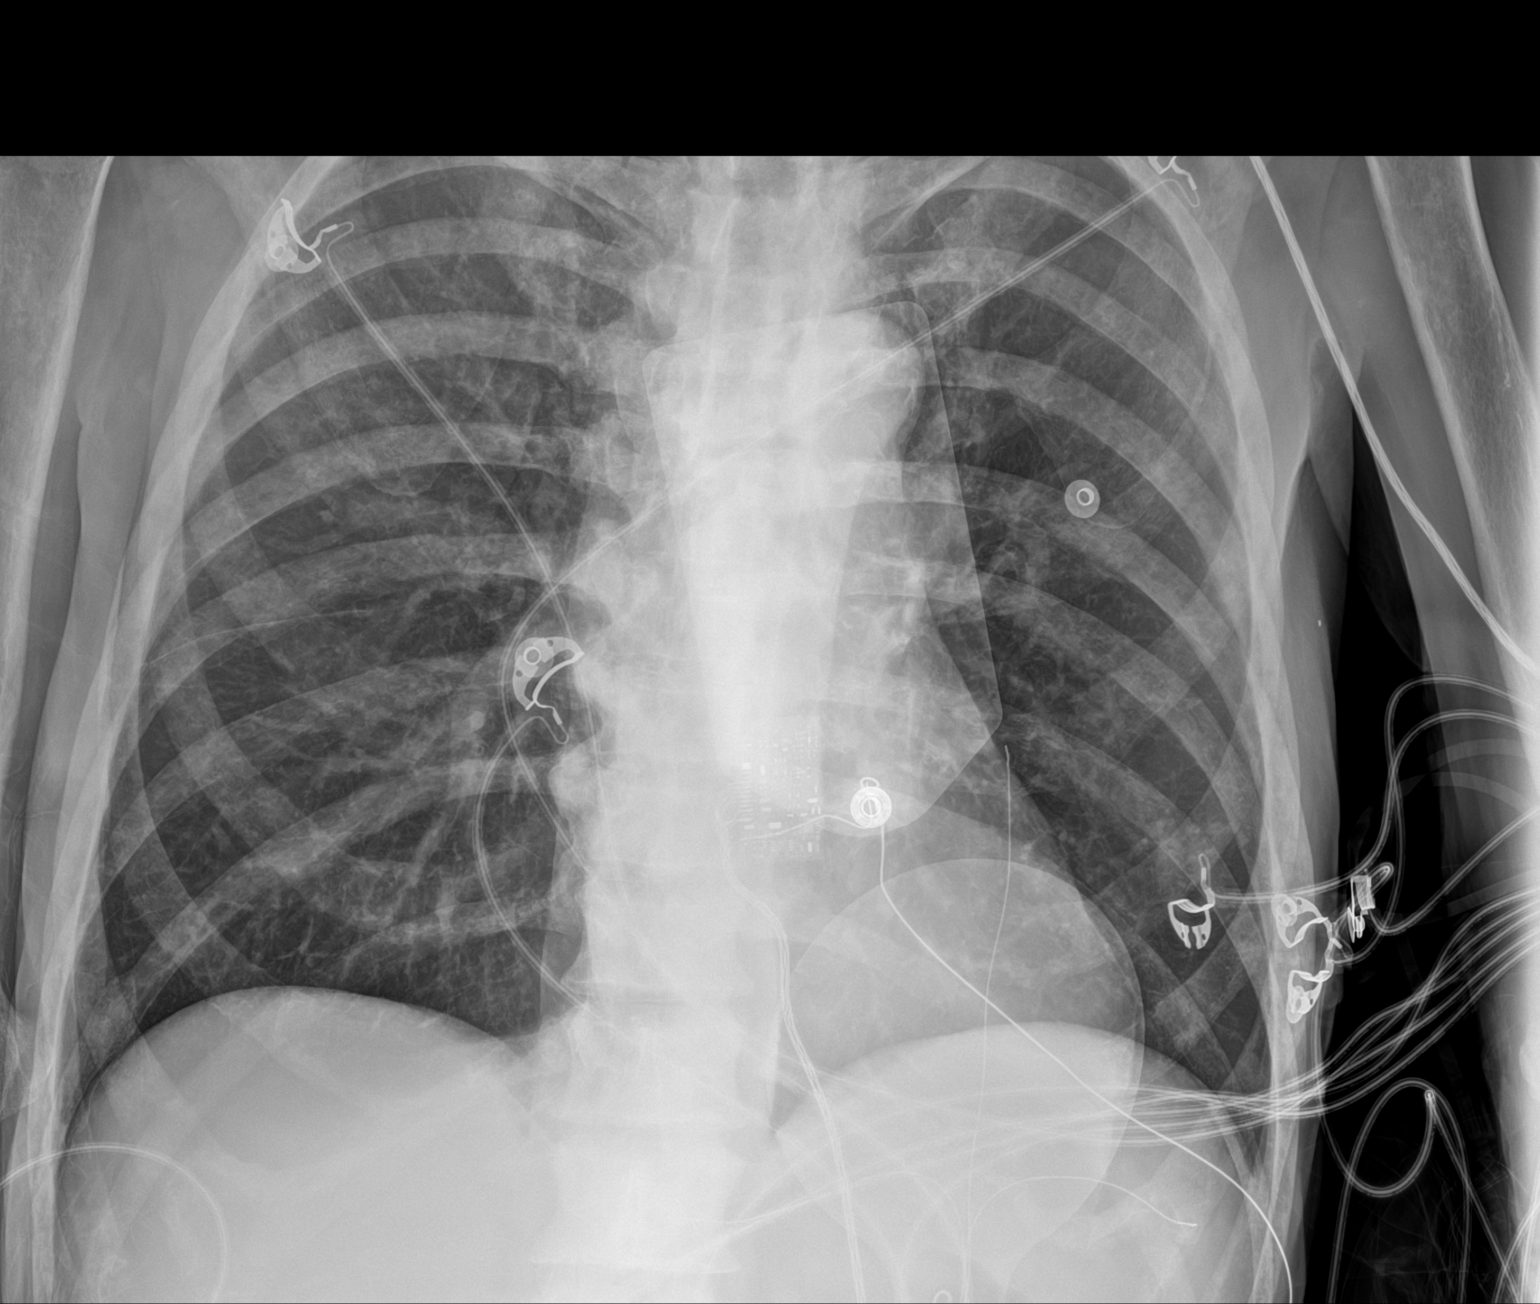

[2 of 2 positions shown; findings below may reference images not displayed]

FINDINGS: Pacer or resuscitation pads projecting over the central chest.
Somewhat large lung volumes. Mediastinal contours remain normal.
Visualized tracheal air column is within normal limits. Allowing for
portable technique the lungs are clear. No pneumothorax.

Questionable heterogeneous bone mineralization throughout the
visible chest and spine. This might be artifact due to technique.

Paucity of bowel gas in the upper abdomen.
IMPRESSION: 1.  No acute cardiopulmonary abnormality.
2. Questionable new generalized abnormal sclerosis in the visible
ribs and spine. This may be artifact but sclerotic metastatic
disease to bone is difficult to exclude.
